# Patient Record
Sex: Male | Born: 1935 | Race: White | Hispanic: No | Smoking: Never smoker
Health system: Southern US, Community
[De-identification: ages and names within clinical notes are randomized; demographics above are authoritative.]

## PROBLEM LIST (undated history)

## (undated) DIAGNOSIS — E7800 Pure hypercholesterolemia, unspecified: Secondary | ICD-10-CM

## (undated) DIAGNOSIS — E119 Type 2 diabetes mellitus without complications: Secondary | ICD-10-CM

## (undated) DIAGNOSIS — I739 Peripheral vascular disease, unspecified: Secondary | ICD-10-CM

## (undated) DIAGNOSIS — I10 Essential (primary) hypertension: Secondary | ICD-10-CM

## (undated) DIAGNOSIS — K649 Unspecified hemorrhoids: Secondary | ICD-10-CM

## (undated) DIAGNOSIS — Z794 Long term (current) use of insulin: Secondary | ICD-10-CM

## (undated) DIAGNOSIS — C4490 Unspecified malignant neoplasm of skin, unspecified: Secondary | ICD-10-CM

## (undated) DIAGNOSIS — Z7902 Long term (current) use of antithrombotics/antiplatelets: Secondary | ICD-10-CM

## (undated) DIAGNOSIS — E785 Hyperlipidemia, unspecified: Secondary | ICD-10-CM

## (undated) DIAGNOSIS — I493 Ventricular premature depolarization: Secondary | ICD-10-CM

## (undated) DIAGNOSIS — M1990 Unspecified osteoarthritis, unspecified site: Secondary | ICD-10-CM

## (undated) DIAGNOSIS — S065XAA Traumatic subdural hemorrhage with loss of consciousness status unknown, initial encounter: Secondary | ICD-10-CM

## (undated) DIAGNOSIS — I5030 Unspecified diastolic (congestive) heart failure: Secondary | ICD-10-CM

## (undated) DIAGNOSIS — I442 Atrioventricular block, complete: Secondary | ICD-10-CM

## (undated) HISTORY — PX: EYE SURGERY: SHX253

## (undated) HISTORY — PX: HERNIA REPAIR: SHX51

## (undated) HISTORY — PX: HEMORRHOID BANDING: SHX5850

## (undated) HISTORY — PX: FEMORAL ENDARTERECTOMY: SUR606

---

## 2007-11-28 ENCOUNTER — Ambulatory Visit: Payer: Self-pay | Admitting: Vascular Surgery

## 2008-01-14 ENCOUNTER — Ambulatory Visit: Payer: Self-pay | Admitting: Neurology

## 2008-01-20 ENCOUNTER — Inpatient Hospital Stay: Payer: Self-pay | Admitting: Vascular Surgery

## 2009-07-15 IMAGING — XA IR VASCULAR PROCEDURE
9 series · 15 of 24 positions shown · non-contrast
Comparison: none

[Series 1: run · 1 of 16 slices shown (1 of 9)]
[im 1/16]
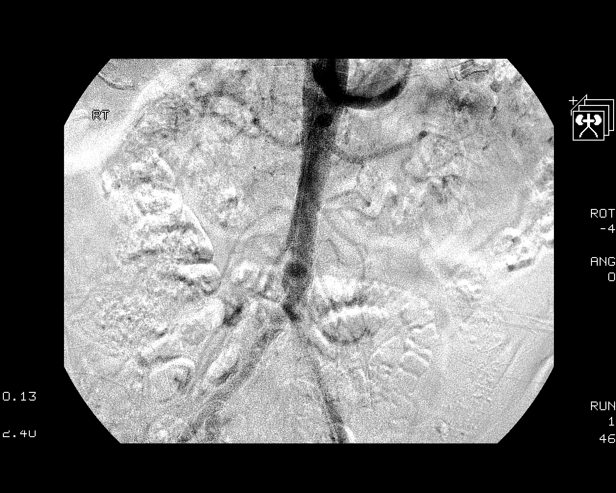

[Series 1: run · 1 of 46 slices shown (2 of 9)]
[im 23/46]
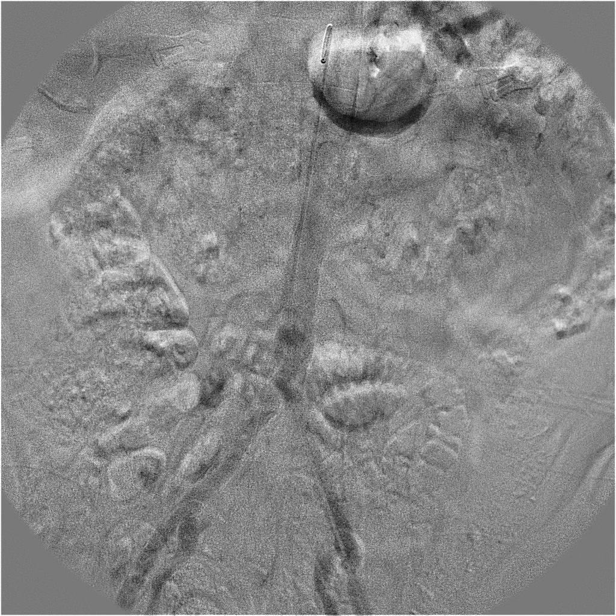

[Series 2: run · 2 of 36 slices shown (3 of 9)]
[im 1/36]
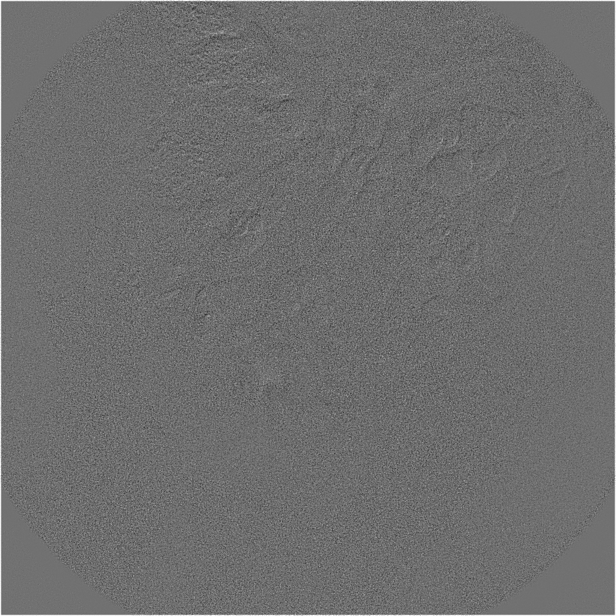
[im 18/36]
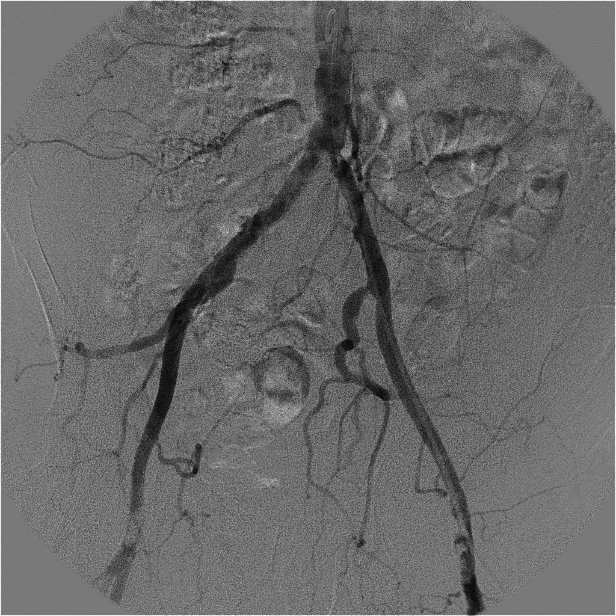

[Series 3: run · 3 of 47 slices shown (4 of 9)]
[im 1/47]
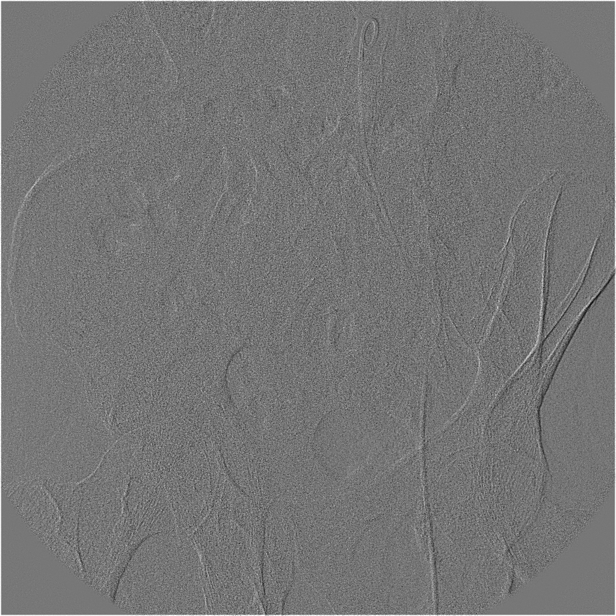
[im 16/47]
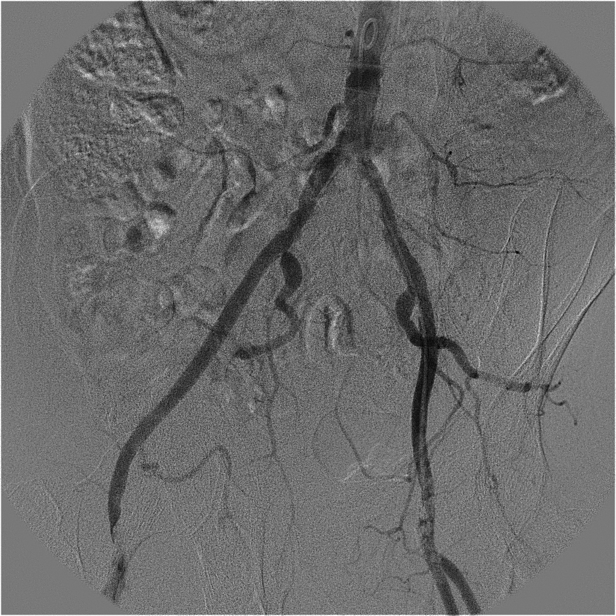
[im 47/47]
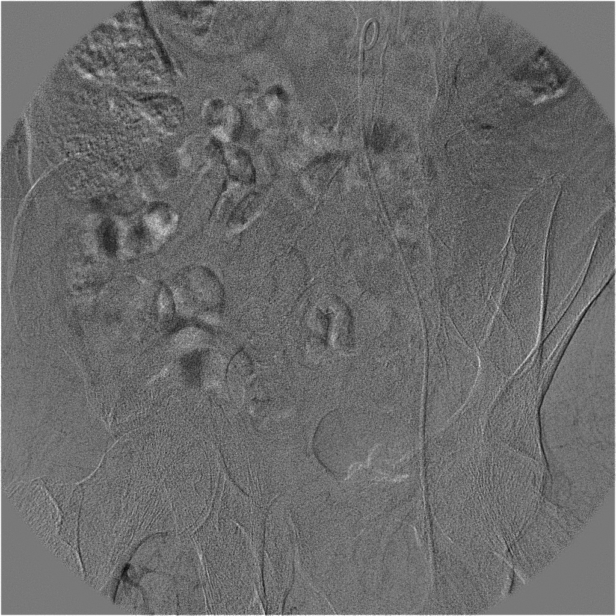

[Series 4: run · 2 of 59 slices shown (5 of 9)]
[im 20/59]
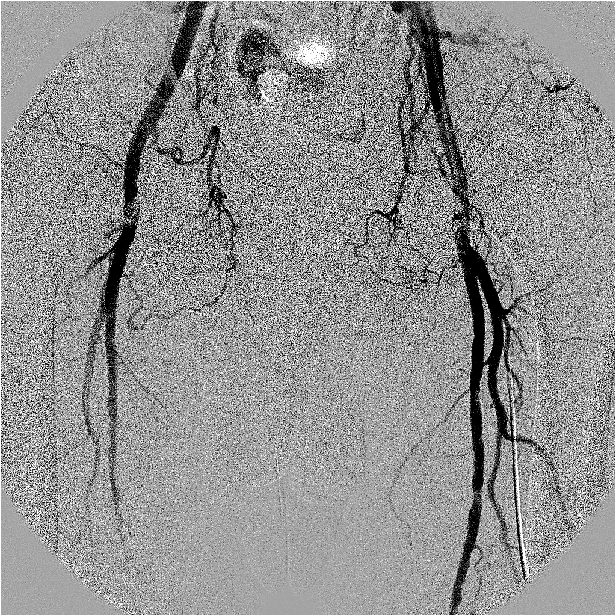
[im 39/59]
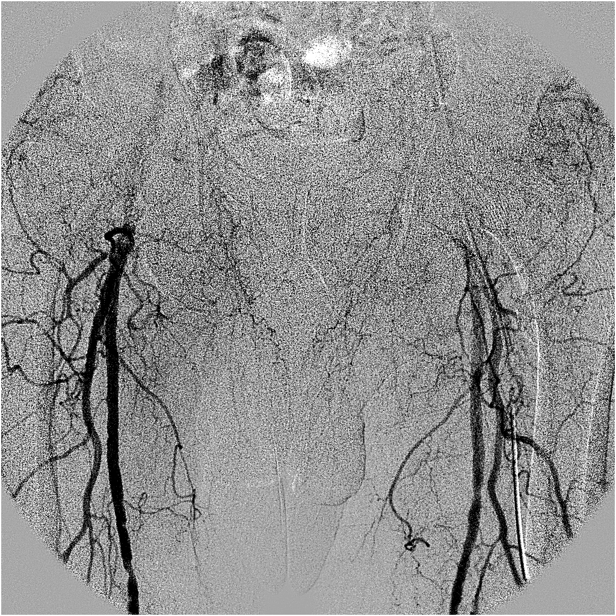

[Series 5: run · 2 of 26 slices shown (6 of 9)]
[im 1/26]
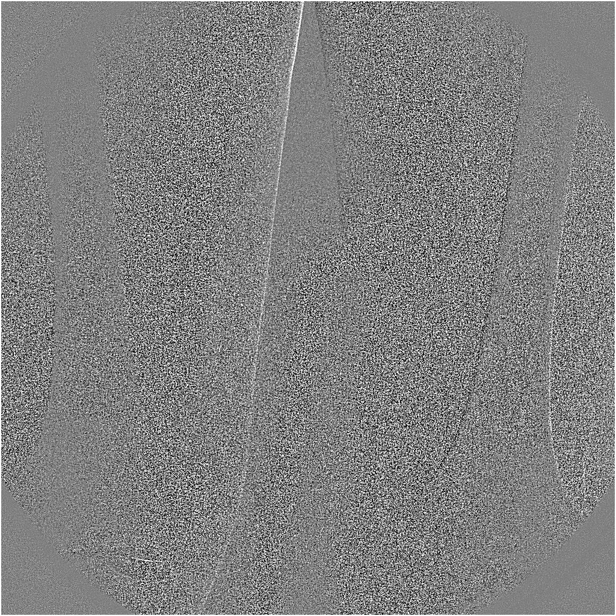
[im 26/26]
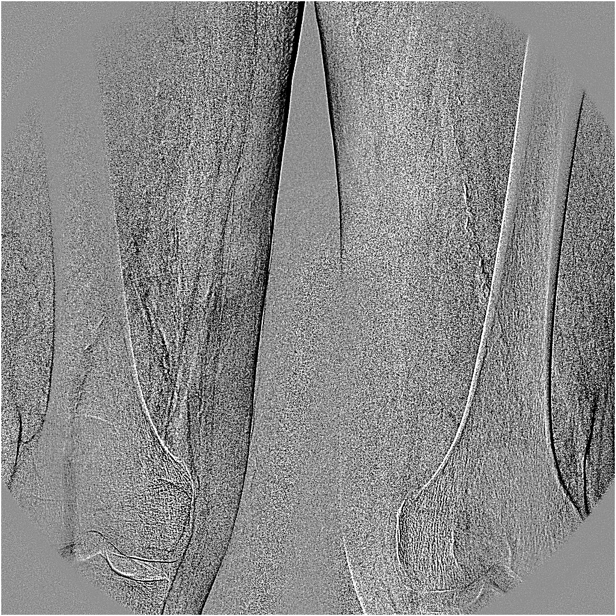

[Series 6: run · 1 of 21 slices shown (7 of 9)]
[im 21/21]
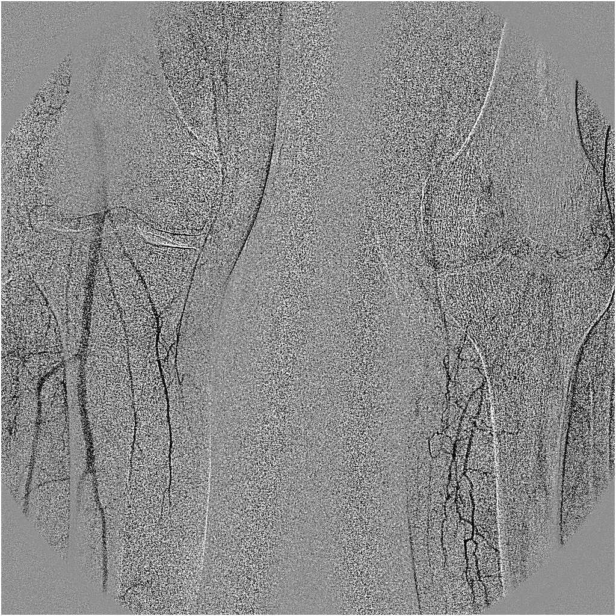

[Series 7: run · 1 of 32 slices shown (8 of 9)]
[im 32/32]
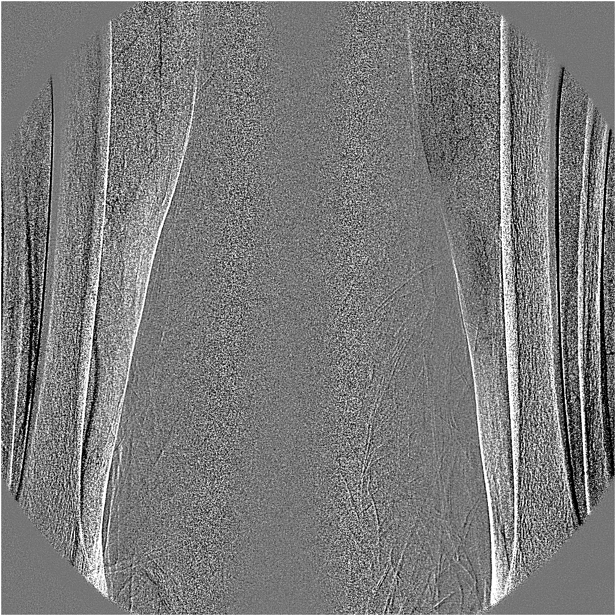

[Series 8: run · 2 of 36 slices shown (9 of 9)]
[im 1/36]
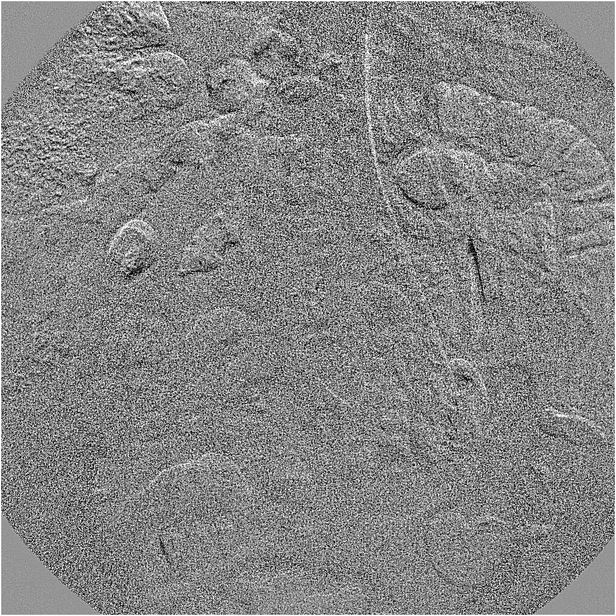
[im 36/36]
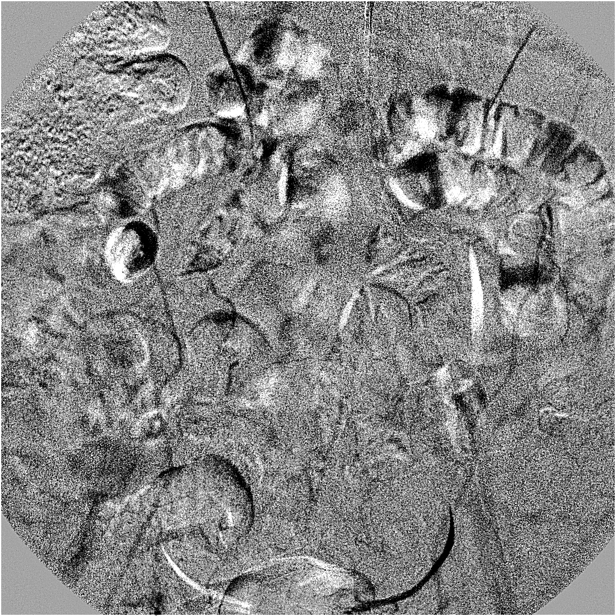

[15 of 24 positions shown; findings below may reference images not displayed]

IMAGES IMPORTED FROM THE [employer] WORKFLOW SYSTEM
NO DICTATION FOR STUDY

## 2009-08-31 IMAGING — CR DG CHEST 2V
1 series · 2 of 2 positions shown · non-contrast
Comparison: none

REASON FOR EXAM: htn,diabetes
COMMENTS:

[Series 1: view not recorded · 0.17mm/px · 2 of 2 slices shown]
[im 1/2]
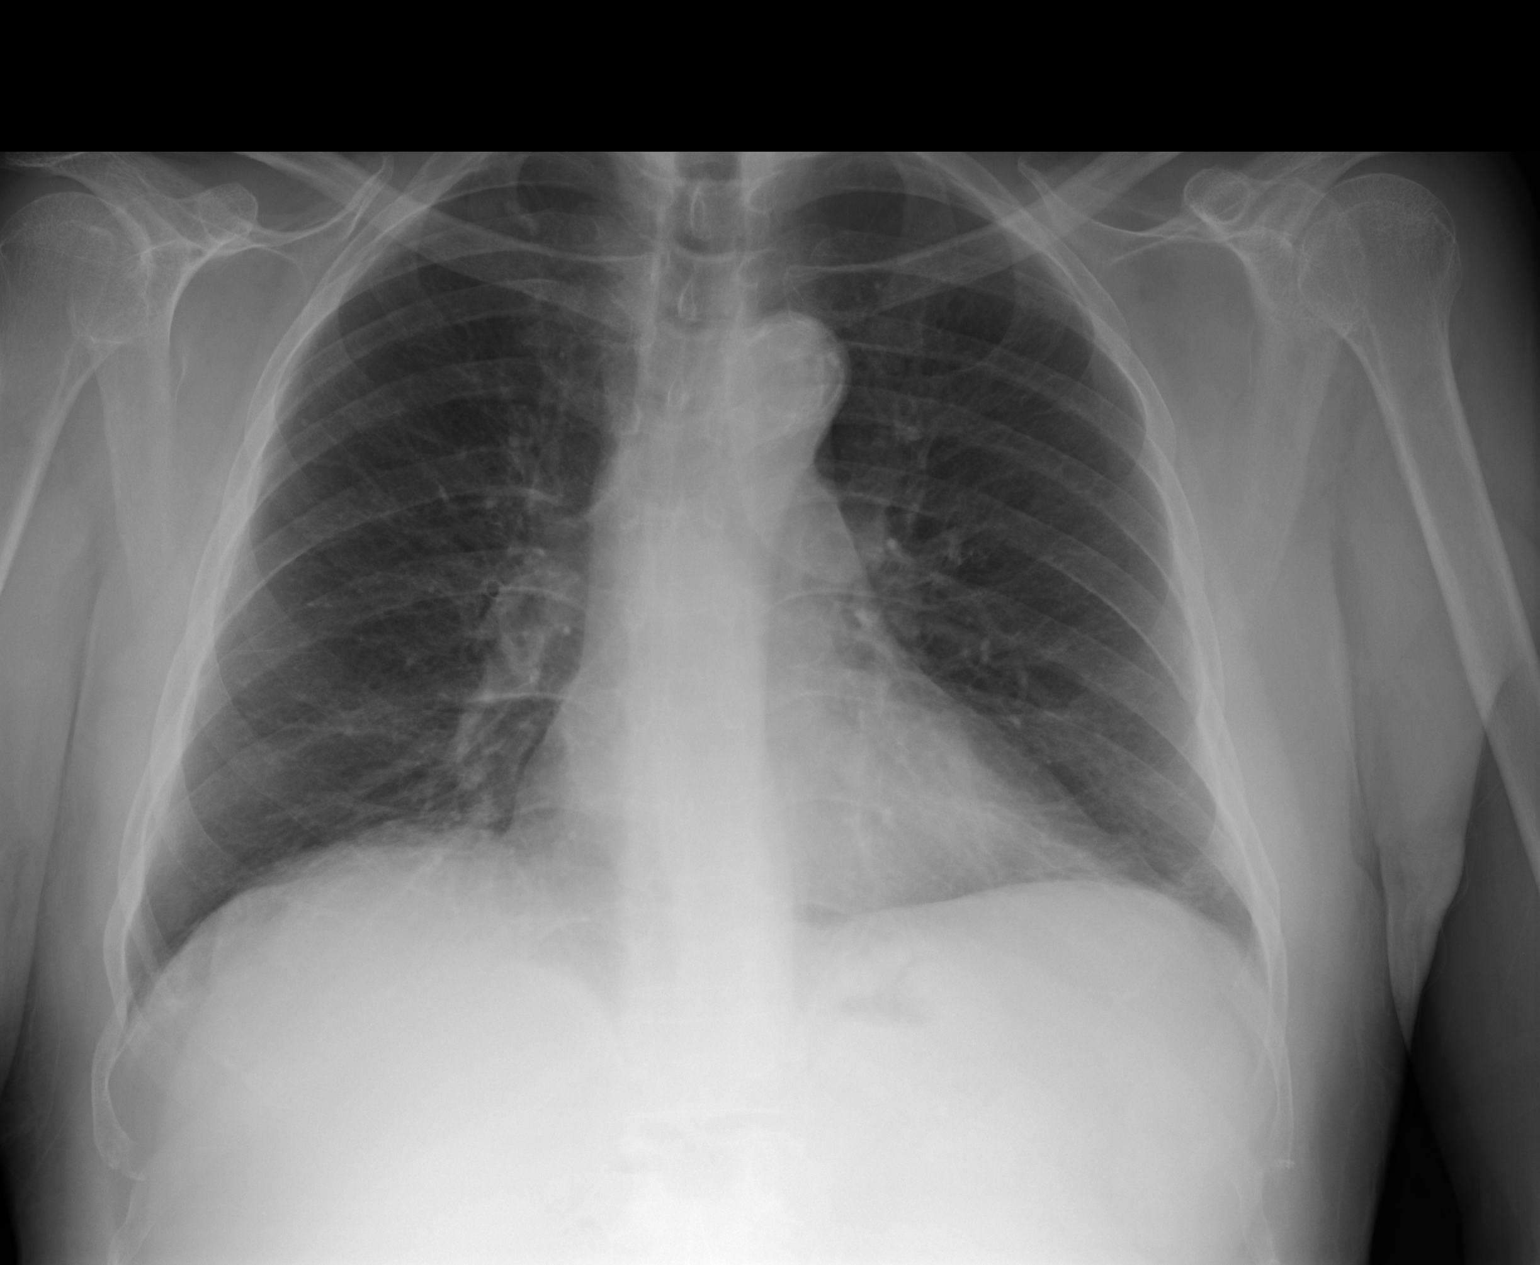
[im 2/2]
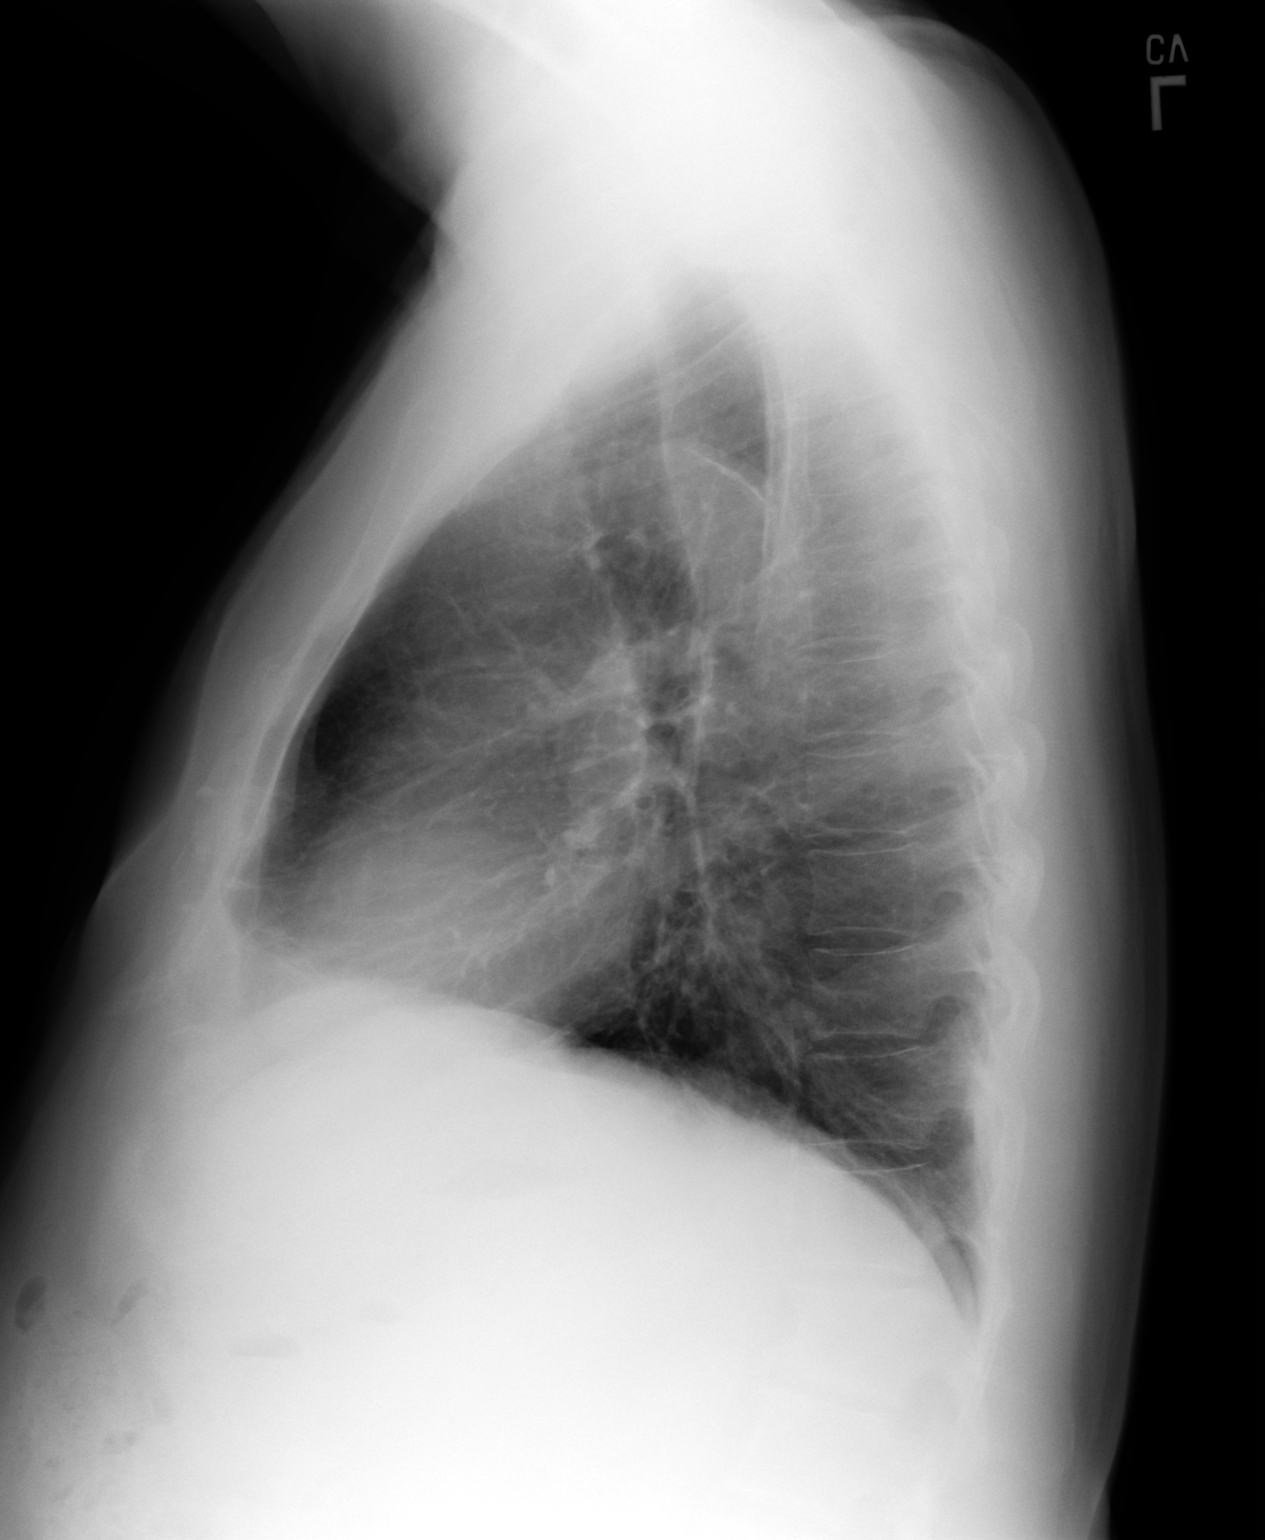

[2 of 2 positions shown; findings below may reference images not displayed]

PROCEDURE:     DXR - DXR CHEST PA (OR AP) AND LATERAL  - January 14, 2008  [DATE]

RESULT:     There is no previous exam for comparison.

Atherosclerotic calcification is present in the aorta. The lungs are clear.
The heart and pulmonary vessels are normal. The bony and mediastinal
structures are unremarkable. There is no effusion. There is no pneumothorax
or evidence of congestive failure.
IMPRESSION: No acute cardiopulmonary disease.

## 2013-01-22 ENCOUNTER — Emergency Department: Payer: Self-pay | Admitting: Emergency Medicine

## 2013-01-22 LAB — URINALYSIS, COMPLETE
Bacteria: NONE SEEN
Bilirubin,UR: NEGATIVE
Blood: NEGATIVE
Glucose,UR: 50 mg/dL
Leukocyte Esterase: NEGATIVE
Nitrite: NEGATIVE
Ph: 6
Protein: NEGATIVE
RBC,UR: <1 /HPF
Specific Gravity: 1.005
Squamous Epithelial: 1
WBC UR: <1 /HPF

## 2013-01-22 LAB — COMPREHENSIVE METABOLIC PANEL WITH GFR
Albumin: 3.5 g/dL
Alkaline Phosphatase: 177 U/L — ABNORMAL HIGH
Anion Gap: 5 — ABNORMAL LOW
BUN: 18 mg/dL
Bilirubin,Total: 0.4 mg/dL
Calcium, Total: 8.9 mg/dL
Chloride: 104 mmol/L
Co2: 28 mmol/L
Creatinine: 1.35 mg/dL — ABNORMAL HIGH
EGFR (African American): 56 — ABNORMAL LOW
EGFR (Non-African Amer.): 48 — ABNORMAL LOW
Glucose: 211 mg/dL — ABNORMAL HIGH
Osmolality: 282
Potassium: 3.9 mmol/L
SGOT(AST): 21 U/L
SGPT (ALT): 20 U/L
Sodium: 137 mmol/L
Total Protein: 6.8 g/dL

## 2013-01-22 LAB — CK TOTAL AND CKMB (NOT AT ARMC)
CK, Total: 42 U/L (ref 35–232)
CK-MB: 1 ng/mL (ref 0.5–3.6)

## 2013-01-22 LAB — CBC
HCT: 45.4 % (ref 40.0–52.0)
HGB: 15.5 g/dL (ref 13.0–18.0)
MCV: 88 fL (ref 80–100)
WBC: 5.9 10*3/uL (ref 3.8–10.6)

## 2013-01-22 LAB — TROPONIN I
Troponin-I: <0.02 ng/mL
Troponin-I: <0.02 ng/mL

## 2016-01-28 ENCOUNTER — Emergency Department
Admission: EM | Admit: 2016-01-28 | Discharge: 2016-01-28 | Payer: Medicare Other | Attending: Emergency Medicine | Admitting: Emergency Medicine

## 2016-01-28 ENCOUNTER — Encounter: Payer: Self-pay | Admitting: Emergency Medicine

## 2016-01-28 ENCOUNTER — Emergency Department: Payer: Medicare Other

## 2016-01-28 ENCOUNTER — Ambulatory Visit (HOSPITAL_COMMUNITY)
Admission: AD | Admit: 2016-01-28 | Discharge: 2016-01-28 | Disposition: A | Discharge status: Home / Self Care | Payer: Medicare Other | Source: Other Acute Inpatient Hospital | Attending: Emergency Medicine | Admitting: Emergency Medicine

## 2016-01-28 DIAGNOSIS — I6200 Nontraumatic subdural hemorrhage, unspecified: Secondary | ICD-10-CM | POA: Insufficient documentation

## 2016-01-28 DIAGNOSIS — X58XXXA Exposure to other specified factors, initial encounter: Secondary | ICD-10-CM | POA: Diagnosis not present

## 2016-01-28 DIAGNOSIS — S065X0A Traumatic subdural hemorrhage without loss of consciousness, initial encounter: Secondary | ICD-10-CM | POA: Insufficient documentation

## 2016-01-28 DIAGNOSIS — Z7984 Long term (current) use of oral hypoglycemic drugs: Secondary | ICD-10-CM | POA: Insufficient documentation

## 2016-01-28 DIAGNOSIS — R0602 Shortness of breath: Secondary | ICD-10-CM | POA: Diagnosis not present

## 2016-01-28 DIAGNOSIS — R112 Nausea with vomiting, unspecified: Secondary | ICD-10-CM | POA: Diagnosis present

## 2016-01-28 DIAGNOSIS — Y939 Activity, unspecified: Secondary | ICD-10-CM | POA: Diagnosis not present

## 2016-01-28 DIAGNOSIS — I442 Atrioventricular block, complete: Secondary | ICD-10-CM | POA: Diagnosis not present

## 2016-01-28 DIAGNOSIS — Y929 Unspecified place or not applicable: Secondary | ICD-10-CM | POA: Diagnosis not present

## 2016-01-28 DIAGNOSIS — E119 Type 2 diabetes mellitus without complications: Secondary | ICD-10-CM | POA: Diagnosis not present

## 2016-01-28 DIAGNOSIS — Z794 Long term (current) use of insulin: Secondary | ICD-10-CM | POA: Insufficient documentation

## 2016-01-28 DIAGNOSIS — S065XAA Traumatic subdural hemorrhage with loss of consciousness status unknown, initial encounter: Secondary | ICD-10-CM

## 2016-01-28 DIAGNOSIS — S065X9A Traumatic subdural hemorrhage with loss of consciousness of unspecified duration, initial encounter: Secondary | ICD-10-CM

## 2016-01-28 DIAGNOSIS — Y999 Unspecified external cause status: Secondary | ICD-10-CM | POA: Insufficient documentation

## 2016-01-28 DIAGNOSIS — I10 Essential (primary) hypertension: Secondary | ICD-10-CM | POA: Diagnosis not present

## 2016-01-28 HISTORY — DX: Essential (primary) hypertension: I10

## 2016-01-28 HISTORY — DX: Type 2 diabetes mellitus without complications: E11.9

## 2016-01-28 HISTORY — DX: Pure hypercholesterolemia, unspecified: E78.00

## 2016-01-28 LAB — CBC WITH DIFFERENTIAL/PLATELET
Basophils Absolute: 0 10*3/uL (ref 0–0.1)
Basophils Relative: 1 %
Eosinophils Absolute: 0 10*3/uL (ref 0–0.7)
Eosinophils Relative: 1 %
HCT: 47.1 % (ref 40.0–52.0)
Hemoglobin: 15.9 g/dL (ref 13.0–18.0)
Lymphocytes Relative: 30 %
Lymphs Abs: 2.7 10*3/uL (ref 1.0–3.6)
MCH: 30.3 pg (ref 26.0–34.0)
MCHC: 33.8 g/dL (ref 32.0–36.0)
MCV: 89.8 fL (ref 80.0–100.0)
Monocytes Absolute: 0.9 10*3/uL (ref 0.2–1.0)
Monocytes Relative: 10 %
Neutro Abs: 5.4 10*3/uL (ref 1.4–6.5)
Neutrophils Relative %: 60 %
Platelets: 213 10*3/uL (ref 150–440)
RBC: 5.24 MIL/uL (ref 4.40–5.90)
RDW: 14.8 % — ABNORMAL HIGH (ref 11.5–14.5)
WBC: 9.1 10*3/uL (ref 3.8–10.6)

## 2016-01-28 LAB — COMPREHENSIVE METABOLIC PANEL
ALT: 30 U/L (ref 17–63)
AST: 22 U/L (ref 15–41)
Albumin: 3.6 g/dL (ref 3.5–5.0)
Alkaline Phosphatase: 160 U/L — ABNORMAL HIGH (ref 38–126)
Anion gap: 9 (ref 5–15)
BUN: 16 mg/dL (ref 6–20)
CO2: 26 mmol/L (ref 22–32)
Calcium: 8.8 mg/dL — ABNORMAL LOW (ref 8.9–10.3)
Chloride: 103 mmol/L (ref 101–111)
Creatinine, Ser: 1.02 mg/dL (ref 0.61–1.24)
GFR calc Af Amer: >60 mL/min (ref >60)
GFR calc non Af Amer: >60 mL/min (ref >60)
Glucose, Bld: 204 mg/dL — ABNORMAL HIGH (ref 65–99)
Potassium: 3.7 mmol/L (ref 3.5–5.1)
Sodium: 138 mmol/L (ref 135–145)
Total Bilirubin: 1.3 mg/dL — ABNORMAL HIGH (ref 0.3–1.2)
Total Protein: 6.5 g/dL (ref 6.5–8.1)

## 2016-01-28 LAB — BRAIN NATRIURETIC PEPTIDE: B Natriuretic Peptide: 970 pg/mL — ABNORMAL HIGH (ref 0.0–100.0)

## 2016-01-28 LAB — TROPONIN I: Troponin I: 0.07 ng/mL (ref <0.03)

## 2016-01-28 LAB — GLUCOSE, CAPILLARY: Glucose-Capillary: 190 mg/dL — ABNORMAL HIGH (ref 65–99)

## 2016-01-28 NOTE — ED Notes (Signed)
Spoke with pt daughter Laural Benes who lives in [place], [place]. Pt daughter informed of pt condition, status and that he will be transferred to [facility]. She can be reached at [phone].

## 2016-01-28 NOTE — ED Notes (Signed)
[facility] arrived to transfer pt to [facility].

## 2016-01-28 NOTE — ED Provider Notes (Signed)
[facility] Emergency Department Provider Note   ____________________________________________   None    (approximate)  I have reviewed the triage vital signs and the nursing notes.   HISTORY  Chief Complaint Code Stroke and Bradycardia   HPI Charles Jennings is a 82 y.o. male patient reports she's had word finding difficulties for about 2 years. He fell and hit his head in June. He had several episodes of nausea vomiting and diarrhea. He went to see his doctor today because he wasn't feeling really well with the nausea and vomiting and his doctor found that he was bradycardic with a wide complex rhythm. She also has developed edema in his legs. Patient's doctor sent him to the emergency room. In the emergency room because he told EMS that he had been having word finding difficulties and didn't say it was for 2 years he had a CT done. CT showed a subdural hematoma as radiology read as acute. Neurology came to see him and reports a normal neurological exam except for possibly a question of some facial droop which may be intermittent.   Past Medical History:  Diagnosis Date  . Diabetes mellitus without complication ([place])   . Elevated cholesterol   . Hypertension     There are no active problems to display for this patient.   No past surgical history on file.  Prior to Admission medications   Medication Sig Start Date End Date Taking? Authorizing Provider  ASPIR-LOW 81 MG EC tablet Take 1 tablet by mouth daily. 11/30/15  Yes Historical Provider, MD  diltiazem (TIAZAC) 180 MG 24 hr capsule Take 1 capsule by mouth daily. 11/30/15  Yes Historical Provider, MD  lisinopril (PRINIVIL,ZESTRIL) 30 MG tablet Take 1 tablet by mouth daily. 11/30/15  Yes Historical Provider, MD  loratadine (CLARITIN) 10 MG tablet Take 1 tablet by mouth daily. 11/30/15  Yes Historical Provider, MD  NOVOLIN 70/30 (70-30) 100 UNIT/ML injection 10 Units at bedtime.  11/30/15  Yes Historical  Provider, MD  pentoxifylline (TRENTAL) 400 MG CR tablet Take 1 tablet by mouth 3 (three) times daily. 11/30/15  Yes Historical Provider, MD  ranitidine (ZANTAC) 150 MG tablet Take 2 tablets by mouth daily. 11/30/15  Yes Historical Provider, MD  simvastatin (ZOCOR) 10 MG tablet Take 1 tablet by mouth daily. 11/30/15  Yes Historical Provider, MD  metFORMIN (GLUCOPHAGE) 500 MG tablet  11/30/15   Historical Provider, MD    Allergies Review of patient's allergies indicates no known allergies.  No family history on file.  Social History Social History  Substance Use Topics  . Smoking status: Not on file  . Smokeless tobacco: Not on file  . Alcohol use Not on file    Review of Systems Constitutional: No fever/chills Eyes: No visual changes. ENT: No sore throat. Cardiovascular: Denies chest pain. Respiratory: Denies shortness of breath. Gastrointestinal: No abdominal pain.  No nausea, no vomiting.  No diarrhea.  No constipation. Genitourinary: Negative for dysuria. Musculoskeletal: Negative for back pain. Skin: Negative for rash. Neurological: Negative for headaches, focal weakness or numbness.  10-point ROS otherwise negative.  ____________________________________________   PHYSICAL EXAM:  VITAL SIGNS: ED Triage Vitals [01/28/16 1255]  Enc Vitals Group     BP (!) 204/89     Pulse Rate (!) 44     Resp 13     Temp      Temp src      SpO2 92 %     Weight  Height      Head Circumference      Peak Flow      Pain Score      Pain Loc      Pain Edu?      Excl. in [place]?     Constitutional: Alert and oriented. Well appearing and in no acute distress. Eyes: Conjunctivae are normal. PERRL. EOMI. Head: Atraumatic. Nose: No congestion/rhinnorhea. Mouth/Throat: Mucous membranes are moist.  Oropharynx non-erythematous. Neck: No stridor.   CardiovascularRate cardiac, regular rhythm. Grossly normal heart sounds.  Good peripheral circulation. Respiratory: Normal respiratory  effort.  No retractions. Lungs Crackles in bases Gastrointestinal: Soft and nontender. No distention. No abdominal bruits. No CVA tenderness. Musculoskeletal: No lower extremity tenderness bilateral 1+ edema.  No joint effusions. Neurologic:  Normal speech and language. No gross focal neurologic deficits are appreciated.  Skin:  Skin is warm, dry and intact. No rash noted. Psychiatric: Mood and affect are normal. Speech and behavior are normal.  ____________________________________________   LABS (all labs ordered are listed, but only abnormal results are displayed)  Labs Reviewed  GLUCOSE, CAPILLARY - Abnormal; Notable for the following:       Result Value   Glucose-Capillary 190 (*)    All other components within normal limits  COMPREHENSIVE METABOLIC PANEL - Abnormal; Notable for the following:    Glucose, Bld 204 (*)    Calcium 8.8 (*)    Alkaline Phosphatase 160 (*)    Total Bilirubin 1.3 (*)    All other components within normal limits  BRAIN NATRIURETIC PEPTIDE - Abnormal; Notable for the following:    B Natriuretic Peptide 970.0 (*)    All other components within normal limits  TROPONIN I - Abnormal; Notable for the following:    Troponin I 0.07 (*)    All other components within normal limits  CBC WITH DIFFERENTIAL/PLATELET - Abnormal; Notable for the following:    RDW 14.8 (*)    All other components within normal limits   ____________________________________________  EKG  EKG done at the doctor's office shows wide complex bradycardia rate of 41 apparent atrioventricular DV disassociation which would make it third degree heart block ____________________________________________  RADIOLOGY  CLINICAL DATA:  Code stroke.  Problems getting words out.  EXAM: CT HEAD WITHOUT CONTRAST  TECHNIQUE: Contiguous axial images were obtained from the base of the skull through the vertex without intravenous contrast.  COMPARISON:  None.  FINDINGS: Brain: Isodense  right subdural collection measuring 18 mm in thickness most consistent with an acute subdural hematoma resulting in 6 mm of right-to-left midline shift. No evidence of acute infarction or ventriculomegaly. Generalized cerebral atrophy. Periventricular white matter low attenuation likely secondary to microangiopathy.  Vascular: Cerebrovascular atherosclerotic calcifications are noted.  Skull: Negative for fracture or focal lesion.  Sinuses/Orbits: Visualized portions of the orbits are unremarkable. Visualized portions of the paranasal sinuses and mastoid air cells are unremarkable.  Other: None.  IMPRESSION: 1. Acute right subdural hematoma measuring 18 mm in thickness resulting and 6 mm of right to left midline shift. No hydrocephalus. Critical Value/emergent results were called by telephone at the time of interpretation on 01/28/2016 at 12:53 pm to Dr. Cinda Quest, who verbally acknowledged these results.   Electronically Signed   By: Kathreen Devoid   On: 01/28/2016 12:57 ____________________________________________   PROCEDURES  Procedure(s) performed:  Procedures  Critical Care performed: Critical care time one hour most of this is spent evaluating patient and lab results and trying to contact various hospitals trying to  get the patient in the talk to [facility] and one other [facility]. He did transfer center and then [facility] and then more time was spent getting the ambulance to take him ____________________________________________   [place] / [place] / ED COURSE  Pertinent labs & imaging results that were available during my care of the patient were reviewed by me and considered in my medical decision making (see chart for details).  [facility] will [facility] patient expressed preference to go to either [facility] or [facility] second  Clinical Course     ____________________________________________   FINAL CLINICAL  IMPRESSION(S) / ED DIAGNOSES  Final diagnoses:  Subdural hematoma ([place])  Third degree heart block ([place])      NEW MEDICATIONS STARTED DURING THIS VISIT:  Discharge Medication List as of 01/28/2016  3:45 PM       Note:  This document was prepared using Dragon voice recognition software and may include unintentional dictation errors.    Nena Polio, MD 01/29/16 2051

## 2016-01-28 NOTE — ED Notes (Signed)
Pt reports falling in June 2017. Pt reports bleeding from an open head injury but his doctor was out of town so he did not go for treatment. Pt does not remember how he fell. Pt states he woke up in a corner of his bedroom. Pt states he only remembers yelling for help and he felt like he was fighting.

## 2016-01-28 NOTE — ED Provider Notes (Signed)
[facility] Provider Note   CSN: [id number] Arrival date & time: 01/28/16  1235   An emergency department physician performed an initial assessment on this suspected stroke patient at 1237.  History   Chief Complaint Chief Complaint  Patient presents with  . Code Stroke  . Bradycardia    HPI Charles Jennings is a 82 y.o. male.  HPI  Past Medical History:  Diagnosis Date  . Diabetes mellitus without complication ([place])   . Elevated cholesterol   . Hypertension     There are no active problems to display for this patient.   No past surgical history on file.     Home Medications    Prior to Admission medications   Medication Sig Start Date End Date Taking? Authorizing Provider  ASPIR-LOW 81 MG EC tablet Take 1 tablet by mouth daily. 11/30/15  Yes Historical Provider, MD  diltiazem (TIAZAC) 180 MG 24 hr capsule Take 1 capsule by mouth daily. 11/30/15  Yes Historical Provider, MD  lisinopril (PRINIVIL,ZESTRIL) 30 MG tablet Take 1 tablet by mouth daily. 11/30/15  Yes Historical Provider, MD  loratadine (CLARITIN) 10 MG tablet Take 1 tablet by mouth daily. 11/30/15  Yes Historical Provider, MD  NOVOLIN 70/30 (70-30) 100 UNIT/ML injection 10 Units at bedtime.  11/30/15  Yes Historical Provider, MD  pentoxifylline (TRENTAL) 400 MG CR tablet Take 1 tablet by mouth 3 (three) times daily. 11/30/15  Yes Historical Provider, MD  ranitidine (ZANTAC) 150 MG tablet Take 2 tablets by mouth daily. 11/30/15  Yes Historical Provider, MD  simvastatin (ZOCOR) 10 MG tablet Take 1 tablet by mouth daily. 11/30/15  Yes Historical Provider, MD  metFORMIN (GLUCOPHAGE) 500 MG tablet  11/30/15   Historical Provider, MD    Family History No family history on file.  Social History Social History  Substance Use Topics  . Smoking status: Not on file  . Smokeless tobacco: Not on file  . Alcohol use Not on file     Allergies   Review of patient's allergies indicates no known  allergies.   Review of Systems Review of Systems   Physical Exam Updated Vital Signs BP (!) 186/54   Pulse (!) 39   Resp 18   SpO2 93%   Physical Exam   ED Treatments / Results  Labs (all labs ordered are listed, but only abnormal results are displayed) Labs Reviewed  GLUCOSE, CAPILLARY - Abnormal; Notable for the following:       Result Value   Glucose-Capillary 190 (*)    All other components within normal limits  COMPREHENSIVE METABOLIC PANEL - Abnormal; Notable for the following:    Glucose, Bld 204 (*)    Calcium 8.8 (*)    Alkaline Phosphatase 160 (*)    Total Bilirubin 1.3 (*)    All other components within normal limits  BRAIN NATRIURETIC PEPTIDE - Abnormal; Notable for the following:    B Natriuretic Peptide 970.0 (*)    All other components within normal limits  TROPONIN I - Abnormal; Notable for the following:    Troponin I 0.07 (*)    All other components within normal limits  CBC WITH DIFFERENTIAL/PLATELET - Abnormal; Notable for the following:    RDW 14.8 (*)    All other components within normal limits    EKG  EKG Interpretation None       Radiology Ct Head Wo Contrast  Result Date: 01/28/2016 CLINICAL DATA:  Code stroke.  Problems getting words out. EXAM: CT HEAD WITHOUT  CONTRAST TECHNIQUE: Contiguous axial images were obtained from the base of the skull through the vertex without intravenous contrast. COMPARISON:  None. FINDINGS: Brain: Isodense right subdural collection measuring 18 mm in thickness most consistent with an acute subdural hematoma resulting in 6 mm of right-to-left midline shift. No evidence of acute infarction or ventriculomegaly. Generalized cerebral atrophy. Periventricular white matter low attenuation likely secondary to microangiopathy. Vascular: Cerebrovascular atherosclerotic calcifications are noted. Skull: Negative for fracture or focal lesion. Sinuses/Orbits: Visualized portions of the orbits are unremarkable. Visualized  portions of the paranasal sinuses and mastoid air cells are unremarkable. Other: None. IMPRESSION: 1. Acute right subdural hematoma measuring 18 mm in thickness resulting and 6 mm of right to left midline shift. No hydrocephalus. Critical Value/emergent results were called by telephone at the time of interpretation on 01/28/2016 at 12:53 pm to Dr. Cinda Quest, who verbally acknowledged these results. Electronically Signed   By: Kathreen Devoid   On: 01/28/2016 12:57   Dg Chest Portable 1 View  Result Date: 01/28/2016 CLINICAL DATA:  Pt reports he has a hard time getting his words out. He has had SOB and nausea, vomiting and diarrhea for 2 weeks. He also fell in June with a concussion but never went to the dr about it. Hx of Diabetes and hypertension. Non smoker EXAM: PORTABLE CHEST 1 VIEW COMPARISON:  01/22/2013 FINDINGS: Cardiac silhouette is mildly enlarged. No mediastinal or hilar masses or evidence of adenopathy. There is bilateral irregular interstitial thickening with central vascular congestion. Small effusions are suspected. No pneumothorax. No focal lung consolidation. Bony thorax is demineralized but intact. IMPRESSION: Findings consistent with congestive heart failure with interstitial pulmonary edema. Electronically Signed   By: Lajean Manes M.D.   On: 01/28/2016 13:59    Procedures Procedures (including critical care time)  Critical care time one hour this includes speaking to [place] and in attempts to transfer him I also spoke to Dr. Wende Mott are neurosurgery at [facility] and Dr. Alita Chyle in the emergency room. He will be accepted to the emergency room in consult neurosurgery and cardiology. Patient has pacer pads on but not connected as again he is symptom-free laying in the bed  Medications Ordered in ED Medications - No data to display   Initial Impression / Assessment and Plan / ED Course  I have reviewed the triage vital signs and the nursing notes.  Pertinent labs & imaging results that  were available during my care of the patient were reviewed by me and considered in my medical decision making (see chart for details).  Clinical Course  I will not give the patient any aspirin with the subdural which is being read as acute. Again the patient is pain-free no signs of acute MI at present. The elevated troponin may be coming down from whatever episode gave him a heart block.  Patient remains pain free comfortable without symptoms lying in the bed.  Final Clinical Impressions(s) / ED Diagnoses   Final diagnoses:  Subdural hematoma ([place])  Third degree heart block [facility])    New Prescriptions New Prescriptions   No medications on file     Nena Polio, MD 01/28/16 1448

## 2016-01-28 NOTE — ED Notes (Signed)
Dr. Cinda Quest notified of troponin 0.07.

## 2016-01-28 NOTE — Consult Note (Addendum)
Referring Physician: Cinda Quest    Chief Complaint: Difficulty with speech  HPI: Charles Jennings is an 82 y.o. male who went to his PCP with multiple complaints today including nausea, vomiting, diarrhea, leg swelling.  Was noted to be bradycardic and hypoxic as well.  EMS was called to bring him to the ED for further evaluation and patient reported that he was having difficult finding the words he wanted to say.  On further questioning patient reported that he had been having that trouble for about two years.  On arrival code stroke was called.  Initial NIHSS of 1.    Date last known well: Unable to determine Time last known well: Unable to determine tPA Given: No: Unknown LKW, SDH on imaging  Past Medical History:  Diagnosis Date  . Diabetes mellitus without complication ([place])   . Elevated cholesterol   . Hypertension     No past surgical history on file.  No family history on file.   Social History:  Patient reports no current history of tobacco, alcohol or illicit drug history.  Allergies: No Known Allergies  Medications: I have reviewed the patient's current medications. Prior to Admission:  Prior to Admission medications   Medication Sig Start Date End Date Taking? Authorizing Provider  ASPIR-LOW 81 MG EC tablet Take 1 tablet by mouth daily. 11/30/15  Yes Historical Provider, MD  diltiazem (TIAZAC) 180 MG 24 hr capsule Take 1 capsule by mouth daily. 11/30/15  Yes Historical Provider, MD  lisinopril (PRINIVIL,ZESTRIL) 30 MG tablet Take 1 tablet by mouth daily. 11/30/15  Yes Historical Provider, MD  loratadine (CLARITIN) 10 MG tablet Take 1 tablet by mouth daily. 11/30/15  Yes Historical Provider, MD  NOVOLIN 70/30 (70-30) 100 UNIT/ML injection 10 Units at bedtime.  11/30/15  Yes Historical Provider, MD  pentoxifylline (TRENTAL) 400 MG CR tablet Take 1 tablet by mouth 3 (three) times daily. 11/30/15  Yes Historical Provider, MD  ranitidine (ZANTAC) 150 MG tablet Take 2 tablets by  mouth daily. 11/30/15  Yes Historical Provider, MD  simvastatin (ZOCOR) 10 MG tablet Take 1 tablet by mouth daily. 11/30/15  Yes Historical Provider, MD  metFORMIN (GLUCOPHAGE) 500 MG tablet  11/30/15   Historical Provider, MD     ROS: History obtained from the patient  General ROS: negative for - chills, fatigue, fever, night sweats, weight gain or weight loss Psychological ROS: negative for - behavioral disorder, hallucinations, memory difficulties, mood swings or suicidal ideation Ophthalmic ROS: negative for - blurry vision, double vision, eye pain or loss of vision ENT ROS: negative for - epistaxis, nasal discharge, oral lesions, sore throat, tinnitus or vertigo Allergy and Immunology ROS: negative for - hives or itchy/watery eyes Hematological and Lymphatic ROS: negative for - bleeding problems, bruising or swollen lymph nodes Endocrine ROS: negative for - galactorrhea, hair pattern changes, polydipsia/polyuria or temperature intolerance Respiratory ROS: negative for - cough, hemoptysis, shortness of breath or wheezing Cardiovascular ROS: as noted in HPI Gastrointestinal ROS: as noted in HPI Genito-Urinary ROS: negative for - dysuria, hematuria, incontinence or urinary frequency/urgency Musculoskeletal ROS: negative for - joint swelling or muscular weakness Neurological ROS: as noted in HPI Dermatological ROS: negative for rash and skin lesion changes  Physical Examination: Blood pressure (!) 160/62, pulse (!) 41, resp. rate 16, SpO2 96 %.  HEENT-  Normocephalic, no lesions, without obvious abnormality.  Normal external eye and conjunctiva.  Normal TM's bilaterally.  Normal auditory canals and external ears. Normal external nose, mucus membranes and septum.  Normal pharynx. Cardiovascular- S1, S2 normal, pulses palpable throughout   Lungs- chest clear, no wheezing, rales, normal symmetric air entry Abdomen- soft, non-tender; bowel sounds normal; no masses,  no  organomegaly Extremities- BLE edema Lymph-no adenopathy palpable Musculoskeletal-no joint tenderness, deformity or swelling Skin-warm and dry, no hyperpigmentation, vitiligo, or suspicious lesions  Neurological Examination Mental Status: Alert, oriented, thought content appropriate.  Speech fluent without evidence of aphasia.  Able to follow 3 step commands without difficulty. Cranial Nerves: II: Discs flat bilaterally; Visual fields grossly normal, pupils equal, round, reactive to light and accommodation III,IV, VI: ptosis not present, extra-ocular motions intact bilaterally V,VII: mild decrease in left NLF, facial light touch sensation normal bilaterally VIII: hearing normal bilaterally IX,X: gag reflex present XI: bilateral shoulder shrug XII: midline tongue extension Motor: Right : Upper extremity   5/5    Left:     Upper extremity   5/5  Lower extremity   5-/5     Lower extremity   5-/5 Tone and bulk:normal tone throughout; no atrophy noted.  LE difficulties due to heaviness and pain from swelling Sensory: Pinprick and light touch intact throughout, bilaterally Deep Tendon Reflexes: 2+ and symmetric with absent AJ's bilaterally Plantars: Right: downgoing   Left: equivocal Cerebellar: Normal finger-to-nose and normal heel-to-shin testing bilaterally Gait: not tested due to safety concerns    Laboratory Studies:  Basic Metabolic Panel: No results for input(s): NA, K, CL, CO2, GLUCOSE, BUN, CREATININE, CALCIUM, MG, PHOS in the last 168 hours.  Liver Function Tests: No results for input(s): AST, ALT, ALKPHOS, BILITOT, PROT, ALBUMIN in the last 168 hours. No results for input(s): LIPASE, AMYLASE in the last 168 hours. No results for input(s): AMMONIA in the last 168 hours.  CBC:  Recent Labs Lab 01/28/16 1259  WBC 9.1  NEUTROABS 5.4  HGB 15.9  HCT 47.1  MCV 89.8  PLT 213    Cardiac Enzymes: No results for input(s): CKTOTAL, CKMB, CKMBINDEX, TROPONINI in the last  168 hours.  BNP: Invalid input(s): POCBNP  CBG:  Recent Labs Lab 01/28/16 [place]    Microbiology: No results found for this or any previous visit.  Coagulation Studies: No results for input(s): LABPROT, INR in the last 72 hours.  Urinalysis: No results for input(s): COLORURINE, LABSPEC, PHURINE, GLUCOSEU, HGBUR, BILIRUBINUR, KETONESUR, PROTEINUR, UROBILINOGEN, NITRITE, LEUKOCYTESUR in the last 168 hours.  Invalid input(s): APPERANCEUR  Lipid Panel: No results found for: CHOL, TRIG, HDL, CHOLHDL, VLDL, LDLCALC  HgbA1C: No results found for: HGBA1C  Urine Drug Screen:  No results found for: LABOPIA, COCAINSCRNUR, LABBENZ, AMPHETMU, THCU, LABBARB  Alcohol Level: No results for input(s): ETH in the last 168 hours.  Other results: EKG: sinus bradycardia at 41 bpm.  Imaging: Ct Head Wo Contrast  Result Date: 01/28/2016 CLINICAL DATA:  Code stroke.  Problems getting words out. EXAM: CT HEAD WITHOUT CONTRAST TECHNIQUE: Contiguous axial images were obtained from the base of the skull through the vertex without intravenous contrast. COMPARISON:  None. FINDINGS: Brain: Isodense right subdural collection measuring 18 mm in thickness most consistent with an acute subdural hematoma resulting in 6 mm of right-to-left midline shift. No evidence of acute infarction or ventriculomegaly. Generalized cerebral atrophy. Periventricular white matter low attenuation likely secondary to microangiopathy. Vascular: Cerebrovascular atherosclerotic calcifications are noted. Skull: Negative for fracture or focal lesion. Sinuses/Orbits: Visualized portions of the orbits are unremarkable. Visualized portions of the paranasal sinuses and mastoid air cells are unremarkable. Other: None. IMPRESSION: 1. Acute right subdural hematoma  measuring 18 mm in thickness resulting and 6 mm of right to left midline shift. No hydrocephalus. Critical Value/emergent results were called by telephone at the time of  interpretation on 01/28/2016 at 12:53 pm to Dr. Cinda Quest, who verbally acknowledged these results. Electronically Signed   By: Kathreen Devoid   On: 01/28/2016 12:57    Assessment: 82 y.o. male presenting with multiple complaints.  Also reported some difficulty with speech.  Head CT performed and personally reviewed.  Right SDH noted with 23mm midline shift.  Patient does report a significant fall in June of this year.  Patient not a tPA candidate and not felt to have had a stroke or TIA therefore no stroke work up indicated at this time.    Stroke Risk Factors - diabetes mellitus, hyperlipidemia and hypertension  Plan: 1. D/C [facility] and [place]  2. Transfer to facility with access to Neurosurgery 3. Telemetry monitoring 4. Frequent neuro checks   Alexis Goodell, MD Neurology [phone] 01/28/2016, 1:52 PM

## 2016-01-28 NOTE — ED Triage Notes (Signed)
Pt arrived via EMS from his PCP office. Pt went to see MD for reports of difficulty getting his words out which he states has been for approximately two years. Pt reported SOB, N/V/D for 2 weeks. Pt reports swelling to lower extremities bilaterally. Pt reports rectal bleeding. EMS reports 214/80 and bradycardia.

## 2016-01-31 DIAGNOSIS — Z950 Presence of cardiac pacemaker: Secondary | ICD-10-CM

## 2016-01-31 HISTORY — PX: PACEMAKER INSERTION: SHX728

## 2016-01-31 HISTORY — DX: Presence of cardiac pacemaker: Z95.0

## 2016-02-11 DIAGNOSIS — E782 Mixed hyperlipidemia: Secondary | ICD-10-CM | POA: Diagnosis present

## 2016-02-11 DIAGNOSIS — I10 Essential (primary) hypertension: Secondary | ICD-10-CM | POA: Diagnosis present

## 2016-02-11 DIAGNOSIS — I442 Atrioventricular block, complete: Secondary | ICD-10-CM | POA: Insufficient documentation

## 2016-02-17 DIAGNOSIS — I493 Ventricular premature depolarization: Secondary | ICD-10-CM | POA: Insufficient documentation

## 2018-03-07 DIAGNOSIS — I5032 Chronic diastolic (congestive) heart failure: Secondary | ICD-10-CM | POA: Insufficient documentation

## 2021-01-03 HISTORY — PX: HEMORRHOID BANDING: SHX5850

## 2021-04-19 ENCOUNTER — Ambulatory Visit: Payer: Self-pay | Admitting: Podiatry

## 2022-05-16 ENCOUNTER — Ambulatory Visit: Payer: Medicare Other

## 2022-05-16 ENCOUNTER — Ambulatory Visit (LOCAL_COMMUNITY_HEALTH_CENTER): Payer: Medicare Other

## 2022-05-16 DIAGNOSIS — Z23 Encounter for immunization: Secondary | ICD-10-CM

## 2022-05-16 DIAGNOSIS — Z719 Counseling, unspecified: Secondary | ICD-10-CM

## 2022-05-16 NOTE — Progress Notes (Signed)
  Are you feeling sick today? No   Have you ever received a dose of COVID-19 Vaccine? [employer], [place], [place], [place], Other) Yes  If yes, which vaccine and how many doses?   5 doses Pfizer   Did you bring the vaccination record card or other documentation?  Yes   Do you have a health condition or are undergoing treatment that makes you moderately or severely immunocompromised? This would include, but not be limited to: cancer, HIV, organ transplant, immunosuppressive therapy/high-dose corticosteroids, or moderate/severe primary immunodeficiency.  No  Have you received COVID-19 vaccine before or during hematopoietic cell transplant (HCT) or CAR-T-cell therapies? No  Have you ever had an allergic reaction to: (This would include a severe allergic reaction or a reaction that caused hives, swelling, or respiratory distress, including wheezing.) A component of a COVID-19 vaccine or a previous dose of COVID-19 vaccine? No   Have you ever had an allergic reaction to another vaccine (other thanCOVID-19 vaccine) or an injectable medication? (This would include a severe allergic reaction or a reaction that caused hives, swelling, or respiratory distress, including wheezing.)   No    Do you have a history of any of the following:  Myocarditis or Pericarditis No  Dermal fillers:  No  Multisystem Inflammatory Syndrome (MIS-C or MIS-A)? No  COVID-19 disease within the past 3 months? No  Vaccinated with monkeypox vaccine in the last 4 weeks? No  VIS provided for each vaccine. Flu and Comirnaty given in left deltoid. RSV given IM in right deltoid. Tolerated well. COVID card updated. [facility] updated. Waited 15 minutes.

## 2022-05-17 NOTE — Progress Notes (Signed)
Patient had a fall in the [facility] parking lot on the way into his nurse clinic appointment. Patient ambulated by himself into the clinic without problem.  Patient was briefly examined by FNP [facility].   Patient had multiple abrasions on bilateral upper extremities. See paper documentation for exact locations. Cuts were cleaned with water and gauze and wrapped, Band-Aid on smaller cuts. Head examined- no abrasions or bruising noted. Denied hitting his head or loss of consciousness. Patient talking without difficulty and oriented. Denied dizziness, states he tripped on the curb and that is why he fell. Examined bilateral knees- no abrasions or signs of injury.  Patient is on oral aspirin, denies other oral anticoagulant use. Considering he did not hit his head and has no evidence of head trauma- I have no concern for serious injury.  Discussed with patient that I felt like he should have follow up with his primary care doctor, considering his age and to discuss his fall risk. Patient was resistant to this. Discussed that I felt it would be good for them to know of the fall, in case problems arise in the next few weeks. Patient mentioned his only daughter lives in [place] and he worries that If we call the PCP, they will tell the daughter he fell and he will be "shipped off to [place]". Reassure that I am not making the recommendation to move, as I don't know him or his medical hx, but I would feel better knowing that his PCP is aware of this fall.  Patient agreed to sign the release and Dr. Hall Busing (PCP) in [place] was called. RN who answered phone stated Charles Jennings is well known to the clinic. They stated he is resistant at times to coming in for care, and often talks about worrying about being moved to [place]. They mention he likes to walk everywhere and he has frequent falls.   Sharlet [place], [place]

## 2022-06-11 ENCOUNTER — Other Ambulatory Visit: Payer: Self-pay

## 2022-06-11 ENCOUNTER — Emergency Department: Payer: Medicare Other

## 2022-06-11 ENCOUNTER — Inpatient Hospital Stay
Admission: EM | Admit: 2022-06-11 | Discharge: 2022-06-15 | DRG: 253 | Disposition: A | Discharge status: Home / Self Care | Payer: Medicare Other | Attending: Student in an Organized Health Care Education/Training Program | Admitting: Student in an Organized Health Care Education/Training Program

## 2022-06-11 ENCOUNTER — Encounter: Payer: Self-pay | Admitting: Internal Medicine

## 2022-06-11 ENCOUNTER — Inpatient Hospital Stay: Payer: Medicare Other

## 2022-06-11 DIAGNOSIS — Z9889 Other specified postprocedural states: Secondary | ICD-10-CM | POA: Diagnosis not present

## 2022-06-11 DIAGNOSIS — I998 Other disorder of circulatory system: Secondary | ICD-10-CM | POA: Diagnosis not present

## 2022-06-11 DIAGNOSIS — E1152 Type 2 diabetes mellitus with diabetic peripheral angiopathy with gangrene: Secondary | ICD-10-CM | POA: Diagnosis present

## 2022-06-11 DIAGNOSIS — R112 Nausea with vomiting, unspecified: Secondary | ICD-10-CM

## 2022-06-11 DIAGNOSIS — E1122 Type 2 diabetes mellitus with diabetic chronic kidney disease: Secondary | ICD-10-CM | POA: Diagnosis not present

## 2022-06-11 DIAGNOSIS — Y838 Other surgical procedures as the cause of abnormal reaction of the patient, or of later complication, without mention of misadventure at the time of the procedure: Secondary | ICD-10-CM | POA: Diagnosis not present

## 2022-06-11 DIAGNOSIS — I10 Essential (primary) hypertension: Secondary | ICD-10-CM | POA: Diagnosis not present

## 2022-06-11 DIAGNOSIS — L97529 Non-pressure chronic ulcer of other part of left foot with unspecified severity: Secondary | ICD-10-CM | POA: Diagnosis present

## 2022-06-11 DIAGNOSIS — Z87891 Personal history of nicotine dependence: Secondary | ICD-10-CM

## 2022-06-11 DIAGNOSIS — Z66 Do not resuscitate: Secondary | ICD-10-CM | POA: Diagnosis present

## 2022-06-11 DIAGNOSIS — N182 Chronic kidney disease, stage 2 (mild): Secondary | ICD-10-CM | POA: Diagnosis not present

## 2022-06-11 DIAGNOSIS — I97618 Postprocedural hemorrhage and hematoma of a circulatory system organ or structure following other circulatory system procedure: Secondary | ICD-10-CM | POA: Diagnosis not present

## 2022-06-11 DIAGNOSIS — E11621 Type 2 diabetes mellitus with foot ulcer: Secondary | ICD-10-CM | POA: Diagnosis present

## 2022-06-11 DIAGNOSIS — N189 Chronic kidney disease, unspecified: Secondary | ICD-10-CM

## 2022-06-11 DIAGNOSIS — N179 Acute kidney failure, unspecified: Secondary | ICD-10-CM | POA: Diagnosis present

## 2022-06-11 DIAGNOSIS — E1142 Type 2 diabetes mellitus with diabetic polyneuropathy: Secondary | ICD-10-CM | POA: Diagnosis present

## 2022-06-11 DIAGNOSIS — Z794 Long term (current) use of insulin: Secondary | ICD-10-CM | POA: Diagnosis not present

## 2022-06-11 DIAGNOSIS — I110 Hypertensive heart disease with heart failure: Secondary | ICD-10-CM | POA: Diagnosis present

## 2022-06-11 DIAGNOSIS — I70222 Atherosclerosis of native arteries of extremities with rest pain, left leg: Secondary | ICD-10-CM | POA: Diagnosis present

## 2022-06-11 DIAGNOSIS — I70262 Atherosclerosis of native arteries of extremities with gangrene, left leg: Secondary | ICD-10-CM | POA: Diagnosis present

## 2022-06-11 DIAGNOSIS — Z950 Presence of cardiac pacemaker: Secondary | ICD-10-CM | POA: Diagnosis not present

## 2022-06-11 DIAGNOSIS — I70245 Atherosclerosis of native arteries of left leg with ulceration of other part of foot: Secondary | ICD-10-CM | POA: Diagnosis not present

## 2022-06-11 DIAGNOSIS — Z79899 Other long term (current) drug therapy: Secondary | ICD-10-CM

## 2022-06-11 DIAGNOSIS — E782 Mixed hyperlipidemia: Secondary | ICD-10-CM | POA: Diagnosis present

## 2022-06-11 DIAGNOSIS — I5032 Chronic diastolic (congestive) heart failure: Secondary | ICD-10-CM | POA: Diagnosis present

## 2022-06-11 DIAGNOSIS — E119 Type 2 diabetes mellitus without complications: Secondary | ICD-10-CM

## 2022-06-11 DIAGNOSIS — I70201 Unspecified atherosclerosis of native arteries of extremities, right leg: Secondary | ICD-10-CM | POA: Diagnosis not present

## 2022-06-11 DIAGNOSIS — Z7982 Long term (current) use of aspirin: Secondary | ICD-10-CM

## 2022-06-11 LAB — CBC WITH DIFFERENTIAL/PLATELET
Abs Immature Granulocytes: 0.03 10*3/uL (ref 0.00–0.07)
Basophils Absolute: 0 10*3/uL (ref 0.0–0.1)
Basophils Relative: 0 %
Eosinophils Absolute: 0 10*3/uL (ref 0.0–0.5)
Eosinophils Relative: 0 %
HCT: 54 % — ABNORMAL HIGH (ref 39.0–52.0)
Hemoglobin: 17.8 g/dL — ABNORMAL HIGH (ref 13.0–17.0)
Immature Granulocytes: 0 %
Lymphocytes Relative: 20 %
Lymphs Abs: 1.9 10*3/uL (ref 0.7–4.0)
MCH: 29.7 pg (ref 26.0–34.0)
MCHC: 33 g/dL (ref 30.0–36.0)
MCV: 90 fL (ref 80.0–100.0)
Monocytes Absolute: 1 10*3/uL (ref 0.1–1.0)
Monocytes Relative: 10 %
Neutro Abs: 6.6 10*3/uL (ref 1.7–7.7)
Neutrophils Relative %: 70 %
Platelets: 187 10*3/uL (ref 150–400)
RBC: 6 MIL/uL — ABNORMAL HIGH (ref 4.22–5.81)
RDW: 13.1 % (ref 11.5–15.5)
WBC: 9.5 10*3/uL (ref 4.0–10.5)
nRBC: 0 % (ref 0.0–0.2)

## 2022-06-11 LAB — LIPID PANEL
Cholesterol: 105 mg/dL (ref 0–200)
HDL: 47 mg/dL (ref >40)
LDL Cholesterol: 44 mg/dL (ref 0–99)
Total CHOL/HDL Ratio: 2.2 RATIO
Triglycerides: 71 mg/dL (ref <150)
VLDL: 14 mg/dL (ref 0–40)

## 2022-06-11 LAB — SEDIMENTATION RATE: Sed Rate: 4 mm/hr (ref 0–20)

## 2022-06-11 LAB — COMPREHENSIVE METABOLIC PANEL
ALT: 12 U/L (ref 0–44)
AST: 15 U/L (ref 15–41)
Albumin: 4.1 g/dL (ref 3.5–5.0)
Alkaline Phosphatase: 188 U/L — ABNORMAL HIGH (ref 38–126)
Anion gap: 14 (ref 5–15)
BUN: 27 mg/dL — ABNORMAL HIGH (ref 8–23)
CO2: 22 mmol/L (ref 22–32)
Calcium: 9.4 mg/dL (ref 8.9–10.3)
Chloride: 95 mmol/L — ABNORMAL LOW (ref 98–111)
Creatinine, Ser: 1.28 mg/dL — ABNORMAL HIGH (ref 0.61–1.24)
GFR, Estimated: 53 mL/min — ABNORMAL LOW (ref >60)
Glucose, Bld: 275 mg/dL — ABNORMAL HIGH (ref 70–99)
Potassium: 4.4 mmol/L (ref 3.5–5.1)
Sodium: 131 mmol/L — ABNORMAL LOW (ref 135–145)
Total Bilirubin: 1.1 mg/dL (ref 0.3–1.2)
Total Protein: 7.5 g/dL (ref 6.5–8.1)

## 2022-06-11 LAB — PROTIME-INR
INR: 1 (ref 0.8–1.2)
Prothrombin Time: 12.9 seconds (ref 11.4–15.2)

## 2022-06-11 LAB — APTT: aPTT: 29 seconds (ref 24–36)

## 2022-06-11 LAB — LACTIC ACID, PLASMA: Lactic Acid, Venous: 1.9 mmol/L (ref 0.5–1.9)

## 2022-06-11 LAB — CBG MONITORING, ED: Glucose-Capillary: 198 mg/dL — ABNORMAL HIGH (ref 70–99)

## 2022-06-11 MED ORDER — DILTIAZEM HCL ER COATED BEADS 120 MG PO CP24
240.0000 mg | ORAL_CAPSULE | Freq: Every day | ORAL | Status: DC
  Administered 2022-06-12 – 2022-06-15 (×4): 240 mg via ORAL
  Filled 2022-06-11: qty 2
  Filled 2022-06-11: qty 1
  Filled 2022-06-11 (×2): qty 2

## 2022-06-11 MED ORDER — MORPHINE SULFATE (PF) 4 MG/ML IV SOLN: 4.0000 mg | INTRAVENOUS | Status: DC | PRN

## 2022-06-11 MED ORDER — HYDROCODONE-ACETAMINOPHEN 5-325 MG PO TABS
1.0000 | ORAL_TABLET | Freq: Four times a day (QID) | ORAL | Status: DC | PRN
  Administered 2022-06-12 – 2022-06-15 (×5): 1 via ORAL
  Filled 2022-06-11 (×4): qty 1

## 2022-06-11 MED ORDER — INSULIN ASPART 100 UNIT/ML IJ SOLN: 0.0000 [IU] | Freq: Three times a day (TID) | INTRAMUSCULAR | Status: DC

## 2022-06-11 MED ORDER — GABAPENTIN 300 MG PO CAPS
300.0000 mg | ORAL_CAPSULE | Freq: Every day | ORAL | Status: DC
  Administered 2022-06-11 – 2022-06-14 (×4): 300 mg via ORAL
  Filled 2022-06-11 (×4): qty 1

## 2022-06-11 MED ORDER — INSULIN ASPART 100 UNIT/ML IJ SOLN
0.0000 [IU] | Freq: Three times a day (TID) | INTRAMUSCULAR | Status: DC
  Administered 2022-06-11 – 2022-06-13 (×2): 2 [IU] via SUBCUTANEOUS
  Administered 2022-06-13 (×2): 1 [IU] via SUBCUTANEOUS
  Administered 2022-06-14 – 2022-06-15 (×4): 2 [IU] via SUBCUTANEOUS
  Filled 2022-06-11 (×8): qty 1

## 2022-06-11 MED ORDER — HEPARIN (PORCINE) 25000 UT/250ML-% IV SOLN
750.0000 [IU]/h | INTRAVENOUS | Status: DC
  Administered 2022-06-11: 1000 [IU]/h via INTRAVENOUS
  Administered 2022-06-12: 850 [IU]/h via INTRAVENOUS
  Administered 2022-06-14 (×2): 750 [IU]/h via INTRAVENOUS
  Filled 2022-06-11 (×3): qty 250

## 2022-06-11 MED ORDER — ACETAMINOPHEN 325 MG PO TABS: 650.0000 mg | ORAL_TABLET | Freq: Four times a day (QID) | ORAL | Status: DC | PRN

## 2022-06-11 MED ORDER — MORPHINE SULFATE (PF) 4 MG/ML IV SOLN
4.0000 mg | INTRAVENOUS | Status: DC | PRN
  Administered 2022-06-11: 4 mg via INTRAVENOUS
  Filled 2022-06-11: qty 1

## 2022-06-11 MED ORDER — HEPARIN BOLUS VIA INFUSION
3700.0000 [IU] | Freq: Once | INTRAVENOUS | Status: AC
  Administered 2022-06-11: 3700 [IU] via INTRAVENOUS
  Filled 2022-06-11: qty 3700

## 2022-06-11 MED ORDER — LACTATED RINGERS IV BOLUS
1000.0000 mL | Freq: Once | INTRAVENOUS | Status: AC
  Administered 2022-06-11: 1000 mL via INTRAVENOUS

## 2022-06-11 MED ORDER — SODIUM CHLORIDE 0.9% FLUSH
3.0000 mL | Freq: Two times a day (BID) | INTRAVENOUS | Status: DC
  Administered 2022-06-12 – 2022-06-15 (×7): 3 mL via INTRAVENOUS

## 2022-06-11 MED ORDER — ACETAMINOPHEN 650 MG RE SUPP: 650.0000 mg | Freq: Four times a day (QID) | RECTAL | Status: DC | PRN

## 2022-06-11 MED ORDER — ONDANSETRON HCL 4 MG/2ML IJ SOLN
4.0000 mg | Freq: Four times a day (QID) | INTRAMUSCULAR | Status: DC | PRN
  Administered 2022-06-13: 4 mg via INTRAVENOUS
  Filled 2022-06-11: qty 2

## 2022-06-11 MED ORDER — ONDANSETRON HCL 4 MG PO TABS: 4.0000 mg | ORAL_TABLET | Freq: Four times a day (QID) | ORAL | Status: DC | PRN

## 2022-06-11 MED ORDER — ASPIRIN 81 MG PO TBEC: 81.0000 mg | DELAYED_RELEASE_TABLET | Freq: Every day | ORAL | Status: DC

## 2022-06-11 MED ORDER — SENNOSIDES-DOCUSATE SODIUM 8.6-50 MG PO TABS: 1.0000 | ORAL_TABLET | Freq: Every evening | ORAL | Status: DC | PRN

## 2022-06-11 MED ORDER — METRONIDAZOLE 500 MG/100ML IV SOLN
500.0000 mg | Freq: Two times a day (BID) | INTRAVENOUS | Status: DC
  Administered 2022-06-11 – 2022-06-13 (×4): 500 mg via INTRAVENOUS
  Filled 2022-06-11 (×4): qty 100

## 2022-06-11 MED ORDER — SODIUM CHLORIDE 0.9 % IV SOLN
2.0000 g | INTRAVENOUS | Status: DC
  Administered 2022-06-11 – 2022-06-14 (×4): 2 g via INTRAVENOUS
  Filled 2022-06-11: qty 2
  Filled 2022-06-11 (×2): qty 20
  Filled 2022-06-11: qty 2
  Filled 2022-06-11: qty 20

## 2022-06-11 MED ORDER — ATORVASTATIN CALCIUM 20 MG PO TABS
40.0000 mg | ORAL_TABLET | Freq: Every day | ORAL | Status: DC
  Administered 2022-06-11 – 2022-06-12 (×2): 40 mg via ORAL
  Filled 2022-06-11 (×2): qty 2

## 2022-06-11 MED ORDER — ASPIRIN 325 MG PO TABS
325.0000 mg | ORAL_TABLET | Freq: Every day | ORAL | Status: DC
  Administered 2022-06-11 – 2022-06-13 (×3): 325 mg via ORAL
  Filled 2022-06-11 (×4): qty 1

## 2022-06-11 MED ORDER — LISINOPRIL 20 MG PO TABS
40.0000 mg | ORAL_TABLET | Freq: Every day | ORAL | Status: DC
  Administered 2022-06-12 – 2022-06-15 (×3): 40 mg via ORAL
  Filled 2022-06-11: qty 2
  Filled 2022-06-11: qty 4
  Filled 2022-06-11 (×2): qty 2

## 2022-06-11 MED ORDER — INSULIN GLARGINE-YFGN 100 UNIT/ML ~~LOC~~ SOLN
5.0000 [IU] | Freq: Every day | SUBCUTANEOUS | Status: DC
  Administered 2022-06-12: 5 [IU] via SUBCUTANEOUS
  Filled 2022-06-11 (×2): qty 0.05

## 2022-06-11 NOTE — Consult Note (Signed)
ANTICOAGULATION CONSULT NOTE - Initial Consult  Pharmacy Consult for heparin drip Indication:  limb ischemia  No Known Allergies  Patient Measurements: Height: '5\' 7"'$  (170.2 cm) Weight: 63 kg (139 lb) IBW/kg (Calculated) : 66.1 Heparin Dosing Weight: 63 kg  Vital Signs: Temp: 98.2 F (36.8 C) (01/07 1544) Temp Source: Oral (01/07 1544) BP: 172/71 (01/07 1544) Pulse Rate: 93 (01/07 1544)  Labs: Recent Labs    06/11/22 1547  HGB 17.8*  HCT 54.0*  PLT 187  CREATININE 1.28*    Estimated Creatinine Clearance: 32.9 mL/min (A) (by C-G formula based on SCr of 1.28 mg/dL (H)).   Medical History: Past Medical History:  Diagnosis Date   Diabetes mellitus without complication (HCC)    Elevated cholesterol    Hypertension     Medications:  No anticoagulation per pharmacist review  Assessment: 89 yo male presented to ED with complaint of toe pain.  Pharmacy consulted to start heparin drip due to concern of limb ischemia.  Baseline aPTT and PT/INR are pending.  Goal of Therapy:  Heparin level 0.3-0.7 units/ml Monitor platelets by anticoagulation protocol: Yes   Plan:  Give 3700 units bolus x 1 Start heparin infusion at 1000 units/hr Check anti-Xa level in 8 hours and daily while on heparin Continue to monitor H&H and platelets  Lorin Picket, PharmD 06/11/2022,6:10 PM

## 2022-06-11 NOTE — Assessment & Plan Note (Addendum)
-   Lipid panel pending - Start atorvastatin 40 mg daily - Discontinue home simvastatin

## 2022-06-11 NOTE — H&P (View-Only) (Signed)
Reason for Consult: Dusky left toes 2 and 4, necrotic third toe tip Referring Physician: Dr. Merrily Brittle Charles Jennings is an 89 y.o. male.  HPI: This is a very nice 89 year old gentleman who previously was walking 5 miles daily at a 15 minute per mile clip.  He has a history of bilateral common femoral artery in the remote past.  Vascular risk factors include hypertension, diabetes mellitus, dyslipidemia.  He had a podiatric procedure on 09 April 2022 for nail care on the left, has subsequently not been able to walk due to tenderness.  He has some neuropathy but not dense.  His toes are tender.  He comes in today due to discoloration.  He has not had any formal imaging.  He has a history of heart block, no history of atrial fibrillation.  Past Medical History:  Diagnosis Date   Diabetes mellitus without complication (HCC)    Elevated cholesterol    Hypertension     Past Surgical History:  Procedure Laterality Date   FEMORAL ENDARTERECTOMY Bilateral    HEMORRHOID BANDING      History reviewed. No pertinent family history.  Social History:  reports that he has quit smoking. His smoking use included cigarettes. He has never used smokeless tobacco. He reports that he does not currently use alcohol. He reports that he does not use drugs.  Allergies: No Known Allergies  Medications: I have reviewed the patient's current medications.  Results for orders placed or performed during the hospital encounter of 06/11/22 (from the past 48 hour(s))  Lactic acid, plasma     Status: None   Collection Time: 06/11/22  3:47 PM  Result Value Ref Range   Lactic Acid, Venous 1.9 0.5 - 1.9 mmol/L    Comment: Performed at [facility], [address], [place], [place] [address]  Comprehensive metabolic panel     Status: Abnormal   Collection Time: 06/11/22  3:47 PM  Result Value Ref Range   Sodium 131 (L) 135 - 145 mmol/L   Potassium 4.4 3.5 - 5.1 mmol/L   Chloride 95 (L) 98 - 111 mmol/L    CO2 22 22 - 32 mmol/L   Glucose, Bld 275 (H) 70 - 99 mg/dL    Comment: Glucose reference range applies only to samples taken after fasting for at least 8 hours.   BUN 27 (H) 8 - 23 mg/dL   Creatinine, Ser 1.28 (H) 0.61 - 1.24 mg/dL   Calcium 9.4 8.9 - 10.3 mg/dL   Total Protein 7.5 6.5 - 8.1 g/dL   Albumin 4.1 3.5 - 5.0 g/dL   AST 15 15 - 41 U/L   ALT 12 0 - 44 U/L   Alkaline Phosphatase 188 (H) 38 - 126 U/L   Total Bilirubin 1.1 0.3 - 1.2 mg/dL   GFR, Estimated 53 (L) >60 mL/min    Comment: (NOTE) Calculated using the CKD-EPI Creatinine Equation (2021)    Anion gap 14 5 - 15    Comment: Performed at [facility], [address] [place], [place] [address]  CBC with Differential     Status: Abnormal   Collection Time: 06/11/22  3:47 PM  Result Value Ref Range   WBC 9.5 4.0 - 10.5 K/uL   RBC 6.00 (H) 4.22 - 5.81 MIL/uL   Hemoglobin 17.8 (H) 13.0 - 17.0 g/dL   HCT 54.0 (H) 39.0 - 52.0 %   MCV 90.0 80.0 - 100.0 fL   MCH 29.7 26.0 - 34.0 pg  MCHC 33.0 30.0 - 36.0 g/dL   RDW 13.1 11.5 - 15.5 %   Platelets 187 150 - 400 K/uL   nRBC 0.0 0.0 - 0.2 %   Neutrophils Relative % 70 %   Neutro Abs 6.6 1.7 - 7.7 K/uL   Lymphocytes Relative 20 %   Lymphs Abs 1.9 0.7 - 4.0 K/uL   Monocytes Relative 10 %   Monocytes Absolute 1.0 0.1 - 1.0 K/uL   Eosinophils Relative 0 %   Eosinophils Absolute 0.0 0.0 - 0.5 K/uL   Basophils Relative 0 %   Basophils Absolute 0.0 0.0 - 0.1 K/uL   Immature Granulocytes 0 %   Abs Immature Granulocytes 0.03 0.00 - 0.07 K/uL    Comment: Performed at [facility], [address] [place], [place] [address]  Sedimentation rate     Status: None   Collection Time: 06/11/22  4:00 PM  Result Value Ref Range   Sed Rate 4 0 - 20 mm/hr    Comment: Performed at [facility], [address] [place], [place] [address]    DG Foot Complete Left  Result Date: 06/11/2022 CLINICAL DATA:  Foot pain, necrotic toes EXAM: LEFT FOOT - COMPLETE  3+ VIEW COMPARISON:  None Available. FINDINGS: Bones are diffusely osteopenic. No evidence for fracture, dislocation or cortical destruction. There are mild degenerative changes of the second distal interphalangeal joint. IMPRESSION: 1. No acute osseous abnormality. 2. Osteopenia. Electronically Signed   By: Ronney Asters M.D.   On: 06/11/2022 16:36    Review of Systems Blood pressure (!) 172/71, pulse 93, temperature 98.2 F (36.8 C), temperature source Oral, resp. rate 18, height '5\' 7"'$  (1.702 m), weight 63 kg, SpO2 100 %. Physical Exam On physical examination, he is awake, alert, oriented x 3.  He is in no acute distress.  Face is symmetric, normocephalic atraumatic.  Upper extremities are symmetric with good pulses.  There is no respiratory embarrassment or accessory muscle use.  He has a pacemaker in place.  Abdomen is soft, scaphoid, nontender.  No pulsatile mass.  He has well-healed femoral incisions, 1-2+ femoral pulse on the right, absent on the left.  I cannot appreciate popliteal or pedal pulses.  Both feet are equally warm, with capillary refill that is roughly 3 seconds.  Toes on the left foot are as described in HPI, dusky 2 and 4, necrotic at the tip of the third toe.   Assessment/Plan: 1.  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  His ambulatory distance is remarkably preserved.  Heparin drip has been instituted, he is on aspirin and low-dose statin as an outpatient, plan would be for arteriogram and potential intervention on single or multisegment level tomorrow.  I discussed this with him in detail.  He understands the rationale for this.  He remembers the details of arteriogram given he had this done before his endarterectomies in the remote past.  All questions were answered.  Zara Chess 06/11/2022, 6:48 PM

## 2022-06-11 NOTE — ED Triage Notes (Signed)
Pt states he went to the podiatrist who cut his toenails and cut them too close to the quick on the 5th of November - pt states he got 3 of them to heal but one of them is throbbing and painful and pain goes all the way up into his L calf- pt second toes is purple on the tip and the third toe is dark purple/black on the tip

## 2022-06-11 NOTE — H&P (Addendum)
History and Physical    Patient: Charles Jennings EQA:[id number] DOB: 1927-12-27 DOA: 06/11/2022 DOS: the patient was seen and examined on 06/11/2022 PCP: Albina Billet, MD  Patient coming from: Home  Chief Complaint:  Chief Complaint  Patient presents with   Toe Pain   HPI: Charles Jennings is a 89 y.o. male with medical history significant of HFpEF, complete heart block s/p PPM, hypertension, hyperlipidemia, type 2 diabetes, PAD, who presents to the ED with complaints of toe pain.  [name] states he had his toe nails cut on 04/13/2022 at his podiatrist's office. Shortly thereafter, he noticed that his left toes appeared irritated and he was concerned for infection. Since then, he has been cleaning his feet daily with soap/water, hydrogen peroxide and using triple antibacterial ointment. He felt all his toes were improving except the middle left toe. In the past 1 week, he has noticed significant swelling of his left foot and it has been progressively more painful to walk.  Due to this, he came to the ED today.  He denies any fever, chills.  He denies any erythema going into his foot.  He denies any purulent drainage but states that he believes some of the dark area on his middle toes dried blood.  ED course: On arrival to the ED, patient was afebrile 98.2 with heart rate 93 and blood pressure 172/71. Initial workup remarkable for WBC of 9.5, hemoglobin of 17.8, sodium of 131, glucose of 275, BUN of 27, creatinine 1.28, alkaline phosphatase of 188 and a calculated GFR 53.  Lactic acid within normal limits.  ESR within normal limits.  Foot x-ray was obtained that did not show any evidence of bony destruction.  Due to concern for critical limb ischemia, vascular surgery was consulted and [facility] was contacted for admission.  Review of Systems: As mentioned in the history of present illness. All other systems reviewed and are negative.  Past Medical History:  Diagnosis Date   Diabetes mellitus  without complication (HCC)    Elevated cholesterol    Hypertension    Past Surgical History:  Procedure Laterality Date   FEMORAL ENDARTERECTOMY Bilateral    HEMORRHOID BANDING     Social History:  reports that he has quit smoking. His smoking use included cigarettes. He has never used smokeless tobacco. He reports that he does not currently use alcohol. He reports that he does not use drugs.  No Known Allergies  History reviewed. No pertinent family history.  Prior to Admission medications   Medication Sig Start Date End Date Taking? Authorizing Provider  ASPIR-LOW 81 MG EC tablet Take 1 tablet by mouth daily. 11/30/15   [provider]  diltiazem (TIAZAC) 180 MG 24 hr capsule Take 1 capsule by mouth daily. 11/30/15   [provider]  lisinopril (PRINIVIL,ZESTRIL) 30 MG tablet Take 1 tablet by mouth daily. 11/30/15   [provider]  loratadine (CLARITIN) 10 MG tablet Take 1 tablet by mouth daily. 11/30/15   [provider]  metFORMIN (GLUCOPHAGE) 500 MG tablet  11/30/15   [provider]  NOVOLIN 70/30 (70-30) 100 UNIT/ML injection 10 Units at bedtime.  11/30/15   [provider]  pentoxifylline (TRENTAL) 400 MG CR tablet Take 1 tablet by mouth 3 (three) times daily. 11/30/15   [provider]  ranitidine (ZANTAC) 150 MG tablet Take 2 tablets by mouth daily. 11/30/15   [provider]  simvastatin (ZOCOR) 10 MG tablet Take 1 tablet by mouth daily. 11/30/15  [provider]    Physical Exam: Vitals:   06/11/22 1541 06/11/22 1544  BP:  (!) 172/71  Pulse:  93  Resp:  18  Temp:  98.2 F (36.8 C)  TempSrc:  Oral  SpO2:  100%  Weight: 63 kg   Height: '5\' 7"'$  (1.702 m)    Physical Exam Vitals and nursing note reviewed.  Constitutional:      General: He is not in acute distress.    Appearance: He is normal weight. He is not toxic-appearing.  HENT:     Head: Normocephalic and atraumatic.     Mouth/Throat:      Mouth: Mucous membranes are moist.     Pharynx: Oropharynx is clear.  Eyes:     Conjunctiva/sclera: Conjunctivae normal.     Pupils: Pupils are equal, round, and reactive to light.  Cardiovascular:     Rate and Rhythm: Normal rate and regular rhythm.     Pulses:          Dorsalis pedis pulses are 0 on the right side and 0 on the left side.       Posterior tibial pulses are 0 on the right side and 0 on the left side.     Heart sounds: No murmur heard.    No gallop.  Pulmonary:     Effort: Pulmonary effort is normal. No respiratory distress.     Breath sounds: Normal breath sounds. No wheezing, rhonchi or rales.  Abdominal:     General: Bowel sounds are normal.     Palpations: Abdomen is soft.  Musculoskeletal:     Cervical back: Neck supple.     Right lower leg: No edema.     Left lower leg: No edema.  Feet:     Comments: On the distal end of the left middle phalanx, there is an area of dried blood surrounding the end of the nail, however on the plantar aspect of the toe, there is an area of necrosis hat is extending into the medial aspect.  Significant tenderness to palpation and due to this, unable to palpate any fluctuance.  Erythema noted surrounding the toe extending into the foot.  Significant nonpitting edema of the left foot on the dorsal aspect. Skin:    General: Skin is warm and dry.  Neurological:     General: No focal deficit present.     Mental Status: He is alert and oriented to person, place, and time.  Psychiatric:        Mood and Affect: Mood normal.        Behavior: Behavior normal.           Data Reviewed: CBC with WBC 9.5, hemoglobin of 17.8, MCV 90 and platelets of 187 CMP with sodium of 131, potassium 4.4, chloride 95, bicarb 22, glucose 275, BUN 27, creatinine 1.28, alkaline phosphatase 188, AST 15, ALT 12, GFR 53 Lactic acid within normal limits at 1.9 ESR within normal limits at 4  DG Foot Complete Left  Result Date: 06/11/2022 CLINICAL  DATA:  Foot pain, necrotic toes EXAM: LEFT FOOT - COMPLETE 3+ VIEW COMPARISON:  None Available. FINDINGS: Bones are diffusely osteopenic. No evidence for fracture, dislocation or cortical destruction. There are mild degenerative changes of the second distal interphalangeal joint. IMPRESSION: 1. No acute osseous abnormality. 2. Osteopenia. Electronically Signed   By: Ronney Asters M.D.   On: 06/11/2022 16:36    There are no new results to review at this time.  Assessment and Plan:  *  Limb ischemia Given lack of distal pulses in the left lower extremity, vascular surgeon has been consulted and suspects limb ischemia.  On examination, there is an area of ulceration with surrounding erythema and significant tenderness to palpation.  I am concerned for an overlying infection.  Due to this, will start broad-spectrum antibiotics.  - Vascular surgery consulted; appreciate their recommendations - Heparin infusion per pharmacy dosing - Tentative plan for arteriogram tomorrow morning - Start ceftriaxone and metronidazole - No indication for vancomycin at this time.  Will obtain a MRSA PCR, and if positive, will start at that time - CT without contrast of the left foot to rule out osteo  Diabetes mellitus ([place]) - Hold home NovoLog 70/30, and Jardiance - A1c pending - Semglee 5 units prior to bedtime - SSI, sensitive  Mixed hyperlipidemia - Lipid panel pending - Start atorvastatin 40 mg daily - Discontinue home simvastatin  Benign essential HTN Patient is hypertensive on presentation, however asymptomatic.    - Resume home antihypertensives  CKD (chronic kidney disease) Creatinine is elevated on admission at 1.28 with prior creatinine 7 years ago of 1.02.  I suspect this is chronic in nature.  No history or examination findings to suspect AKI  - Recheck BMP in the a.m.  Advance Care Planning:   Code Status: DNR.  Patient states that in the event of cardiac arrest, he would not want to be  resuscitated.  However in the case of pulmonary arrest only, he is open to a short trial of mechanical ventilation if necessary.  He would not want to be on long-term life support if there is no chance for meaningful recovery.  Consults: Vascular surgery  Family Communication: No family at bedside  Severity of Illness: The appropriate patient status for this patient is INPATIENT. Inpatient status is judged to be reasonable and necessary in order to provide the required intensity of service to ensure the patient's safety. [name] presenting symptoms, physical exam findings, and initial radiographic and laboratory data in the context of their chronic comorbidities is felt to place them at high risk for further clinical deterioration. Furthermore, it is not anticipated that the patient will be medically stable for discharge from the hospital within 2 midnights of admission.   * I certify that at the point of admission it is my clinical judgment that the patient will require inpatient hospital care spanning beyond 2 midnights from the point of admission due to high intensity of service, high risk for further deterioration and high frequency of surveillance required.*  Author: Jose Persia, MD 06/11/2022 7:38 PM  For on call review [email or web address].

## 2022-06-11 NOTE — Assessment & Plan Note (Signed)
-   Hold home NovoLog 70/30, and Jardiance - A1c pending - Semglee 5 units prior to bedtime - SSI, sensitive

## 2022-06-11 NOTE — ED Provider Notes (Signed)
[facility] Provider Note    Event Date/Time   First MD Initiated Contact with Patient 06/11/22 1559     (approximate)   History   Chief Complaint: Toe Pain   HPI  Charles Jennings is a 89 y.o. male with a history of diabetes hypertension CHF PAD who comes ED complaining of worsening left foot toe pain and discoloration over the past week.  Pain is 5/10, but increases to 9/10 when trying to bear any weight.  Reports that symptoms started a few days after he went to podiatry and had nails debrided.  Not on blood thinners.     Physical Exam   Triage Vital Signs: ED Triage Vitals  Enc Vitals Group     BP 06/11/22 1544 (!) 172/71     Pulse Rate 06/11/22 1544 93     Resp 06/11/22 1544 18     Temp 06/11/22 1544 98.2 F (36.8 C)     Temp Source 06/11/22 1544 Oral     SpO2 06/11/22 1544 100 %     Weight 06/11/22 1541 139 lb (63 kg)     Height 06/11/22 1541 '5\' 7"'$  (1.702 m)     Head Circumference --      Peak Flow --      Pain Score 06/11/22 1541 5     Pain Loc --      Pain Edu? --      Excl. in [place]? --     Most recent vital signs: Vitals:   06/11/22 1544 06/11/22 2033  BP: (!) 172/71 (!) 146/73  Pulse: 93 81  Resp: 18 16  Temp: 98.2 F (36.8 C) 98.1 F (36.7 C)  SpO2: 100% 96%    General: Awake, no distress.  CV:  Regular rate.  Normal radial pulses.  No palpable DP or PT pulse in either foot.  Popliteal pulse nonpalpable.  With Doppler interrogation, there is a strong Doppler signal.  No Doppler PT or DP signal in the left foot.  Cap refill is sluggish.  Left second toe is purple.  Left third toe is black and necrotic with distal Ulceration.  Left fourth toe is purple.  No gross pallor or coldness of the extremities. Resp:  Normal effort.  Abd:  No distention.  Other:  No edema.   ED Results / Procedures / Treatments   Labs (all labs ordered are listed, but only abnormal results are displayed) Labs Reviewed  COMPREHENSIVE METABOLIC  PANEL - Abnormal; Notable for the following components:      Result Value   Sodium 131 (*)    Chloride 95 (*)    Glucose, Bld 275 (*)    BUN 27 (*)    Creatinine, Ser 1.28 (*)    Alkaline Phosphatase 188 (*)    GFR, Estimated 53 (*)    All other components within normal limits  CBC WITH DIFFERENTIAL/PLATELET - Abnormal; Notable for the following components:   RBC 6.00 (*)    Hemoglobin 17.8 (*)    HCT 54.0 (*)    All other components within normal limits  CBG MONITORING, ED - Abnormal; Notable for the following components:   Glucose-Capillary 198 (*)    All other components within normal limits  MRSA NEXT GEN BY PCR, NASAL  LACTIC ACID, PLASMA  SEDIMENTATION RATE  APTT  PROTIME-INR  LIPID PANEL  CBC  BASIC METABOLIC PANEL  HEMOGLOBIN A1C  C-REACTIVE PROTEIN  HEPARIN LEVEL (UNFRACTIONATED)     EKG  RADIOLOGY X-ray left foot interpreted by me, appears normal.  Radiology report reviewed   PROCEDURES:  .Critical Care  Performed by: Carrie Mew, MD Authorized by: Carrie Mew, MD   Critical care provider statement:    Critical care time (minutes):  35   Critical care time was exclusive of:  Separately billable procedures and treating other patients   Critical care was necessary to treat or prevent imminent or life-threatening deterioration of the following conditions:  Circulatory failure   Critical care was time spent personally by me on the following activities:  Development of treatment plan with patient or surrogate, discussions with consultants, evaluation of patient's response to treatment, examination of patient, obtaining history from patient or surrogate, ordering and performing treatments and interventions, ordering and review of laboratory studies, ordering and review of radiographic studies, pulse oximetry, re-evaluation of patient's condition and review of old charts   Care discussed with: admitting provider      [place]  ED: Medications  heparin bolus via infusion 3,700 Units (3,700 Units Intravenous Bolus from Bag 06/11/22 2140)    Followed by  heparin ADULT infusion 100 units/mL (25000 units/273m) (1,000 Units/hr Intravenous New Bag/Given 06/11/22 2140)  aspirin tablet 325 mg (325 mg Oral Given 06/11/22 2103)  atorvastatin (LIPITOR) tablet 40 mg (40 mg Oral Given 06/11/22 2103)  sodium chloride flush (NS) 0.9 % injection 3 mL (3 mLs Intravenous Not Given 06/11/22 2146)  acetaminophen (TYLENOL) tablet 650 mg (has no administration in time range)    Or  acetaminophen (TYLENOL) suppository 650 mg (has no administration in time range)  HYDROcodone-acetaminophen (NORCO/VICODIN) 5-325 MG per tablet 1 tablet (has no administration in time range)  senna-docusate (Senokot-S) tablet 1 tablet (has no administration in time range)  ondansetron (ZOFRAN) tablet 4 mg (has no administration in time range)    Or  ondansetron (ZOFRAN) injection 4 mg (has no administration in time range)  morphine (PF) 4 MG/ML injection 4 mg (4 mg Intravenous Given 06/11/22 2134)  insulin glargine-yfgn (SEMGLEE) injection 5 Units (5 Units Subcutaneous Patient Refused/Not Given 06/11/22 2226)  cefTRIAXone (ROCEPHIN) 2 g in sodium chloride 0.9 % 100 mL IVPB (0 g Intravenous Stopped 06/11/22 2132)    And  metroNIDAZOLE (FLAGYL) IVPB 500 mg (500 mg Intravenous New Bag/Given 06/11/22 2136)  diltiazem (CARDIZEM CD) 24 hr capsule 240 mg (has no administration in time range)  lisinopril (ZESTRIL) tablet 40 mg (has no administration in time range)  gabapentin (NEURONTIN) capsule 300 mg (300 mg Oral Given 06/11/22 2132)  insulin aspart (novoLOG) injection 0-9 Units (2 Units Subcutaneous Given 06/11/22 2132)  lactated ringers bolus 1,000 mL (0 mLs Intravenous Stopped 06/11/22 2132)     IMPRESSION / [place] / [place]/ ED COURSE  I reviewed the triage vital signs and the nursing notes.                              Differential diagnosis includes, but is not  limited to, limb ischemia, osteomyelitis, cellulitis, diabetic wound  Patient's presentation is most consistent with acute presentation with potential threat to life or bodily function.  Patient presents with discoloration and necrosis of the second third and fourth toes on the left.  X-ray unremarkable, no fever, no leukocytosis, ESR is normal.  Doppler evaluation of the arterial system of the left leg demonstrates very poor blood flow.  I think that his foot pain and toe necrosis is due to ischemia  from arterial insufficiency.  Doubt infection right now.  Case discussed with vascular surgery Dr. Shelia Media who recommends heparin and hospitalization.  N.p.o. after midnight for vascular lab angiography.       FINAL CLINICAL IMPRESSION(S) / ED DIAGNOSES   Final diagnoses:  Critical limb ischemia of left lower extremity with ulceration of foot ([place])     Rx / DC Orders   ED Discharge Orders     None        Note:  This document was prepared using Dragon voice recognition software and may include unintentional dictation errors.   Carrie Mew, MD 06/11/22 2235

## 2022-06-11 NOTE — Consult Note (Signed)
Reason for Consult: Dusky left toes 2 and 4, necrotic third toe tip Referring Physician: Dr. Merrily Brittle Charles Jennings is an 89 y.o. male.  HPI: This is a very nice 89 year old gentleman who previously was walking 5 miles daily at a 15 minute per mile clip.  He has a history of bilateral common femoral artery in the remote past.  Vascular risk factors include hypertension, diabetes mellitus, dyslipidemia.  He had a podiatric procedure on 09 April 2022 for nail care on the left, has subsequently not been able to walk due to tenderness.  He has some neuropathy but not dense.  His toes are tender.  He comes in today due to discoloration.  He has not had any formal imaging.  He has a history of heart block, no history of atrial fibrillation.  Past Medical History:  Diagnosis Date   Diabetes mellitus without complication (HCC)    Elevated cholesterol    Hypertension     Past Surgical History:  Procedure Laterality Date   FEMORAL ENDARTERECTOMY Bilateral    HEMORRHOID BANDING      History reviewed. No pertinent family history.  Social History:  reports that he has quit smoking. His smoking use included cigarettes. He has never used smokeless tobacco. He reports that he does not currently use alcohol. He reports that he does not use drugs.  Allergies: No Known Allergies  Medications: I have reviewed the patient's current medications.  Results for orders placed or performed during the hospital encounter of 06/11/22 (from the past 48 hour(s))  Lactic acid, plasma     Status: None   Collection Time: 06/11/22  3:47 PM  Result Value Ref Range   Lactic Acid, Venous 1.9 0.5 - 1.9 mmol/L    Comment: Performed at [facility], [address] [place], [place] [address]  Comprehensive metabolic panel     Status: Abnormal   Collection Time: 06/11/22  3:47 PM  Result Value Ref Range   Sodium 131 (L) 135 - 145 mmol/L   Potassium 4.4 3.5 - 5.1 mmol/L   Chloride 95 (L) 98 - 111 mmol/L    CO2 22 22 - 32 mmol/L   Glucose, Bld 275 (H) 70 - 99 mg/dL    Comment: Glucose reference range applies only to samples taken after fasting for at least 8 hours.   BUN 27 (H) 8 - 23 mg/dL   Creatinine, Ser 1.28 (H) 0.61 - 1.24 mg/dL   Calcium 9.4 8.9 - 10.3 mg/dL   Total Protein 7.5 6.5 - 8.1 g/dL   Albumin 4.1 3.5 - 5.0 g/dL   AST 15 15 - 41 U/L   ALT 12 0 - 44 U/L   Alkaline Phosphatase 188 (H) 38 - 126 U/L   Total Bilirubin 1.1 0.3 - 1.2 mg/dL   GFR, Estimated 53 (L) >60 mL/min    Comment: (NOTE) Calculated using the CKD-EPI Creatinine Equation (2021)    Anion gap 14 5 - 15    Comment: Performed at [facility], [address] [place], [place] [address]  CBC with Differential     Status: Abnormal   Collection Time: 06/11/22  3:47 PM  Result Value Ref Range   WBC 9.5 4.0 - 10.5 K/uL   RBC 6.00 (H) 4.22 - 5.81 MIL/uL   Hemoglobin 17.8 (H) 13.0 - 17.0 g/dL   HCT 54.0 (H) 39.0 - 52.0 %   MCV 90.0 80.0 - 100.0 fL   MCH 29.7 26.0 - 34.0 pg  MCHC 33.0 30.0 - 36.0 g/dL   RDW 13.1 11.5 - 15.5 %   Platelets 187 150 - 400 K/uL   nRBC 0.0 0.0 - 0.2 %   Neutrophils Relative % 70 %   Neutro Abs 6.6 1.7 - 7.7 K/uL   Lymphocytes Relative 20 %   Lymphs Abs 1.9 0.7 - 4.0 K/uL   Monocytes Relative 10 %   Monocytes Absolute 1.0 0.1 - 1.0 K/uL   Eosinophils Relative 0 %   Eosinophils Absolute 0.0 0.0 - 0.5 K/uL   Basophils Relative 0 %   Basophils Absolute 0.0 0.0 - 0.1 K/uL   Immature Granulocytes 0 %   Abs Immature Granulocytes 0.03 0.00 - 0.07 K/uL    Comment: Performed at [facility], [address] [place], [place] [address]  Sedimentation rate     Status: None   Collection Time: 06/11/22  4:00 PM  Result Value Ref Range   Sed Rate 4 0 - 20 mm/hr    Comment: Performed at [facility], [address] [place], [place] [address]    DG Foot Complete Left  Result Date: 06/11/2022 CLINICAL DATA:  Foot pain, necrotic toes EXAM: LEFT FOOT - COMPLETE  3+ VIEW COMPARISON:  None Available. FINDINGS: Bones are diffusely osteopenic. No evidence for fracture, dislocation or cortical destruction. There are mild degenerative changes of the second distal interphalangeal joint. IMPRESSION: 1. No acute osseous abnormality. 2. Osteopenia. Electronically Signed   By: Ronney Asters M.D.   On: 06/11/2022 16:36    Review of Systems Blood pressure (!) 172/71, pulse 93, temperature 98.2 F (36.8 C), temperature source Oral, resp. rate 18, height '5\' 7"'$  (1.702 m), weight 63 kg, SpO2 100 %. Physical Exam On physical examination, he is awake, alert, oriented x 3.  He is in no acute distress.  Face is symmetric, normocephalic atraumatic.  Upper extremities are symmetric with good pulses.  There is no respiratory embarrassment or accessory muscle use.  He has a pacemaker in place.  Abdomen is soft, scaphoid, nontender.  No pulsatile mass.  He has well-healed femoral incisions, 1-2+ femoral pulse on the right, absent on the left.  I cannot appreciate popliteal or pedal pulses.  Both feet are equally warm, with capillary refill that is roughly 3 seconds.  Toes on the left foot are as described in HPI, dusky 2 and 4, necrotic at the tip of the third toe.   Assessment/Plan: 1.  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  His ambulatory distance is remarkably preserved.  Heparin drip has been instituted, he is on aspirin and low-dose statin as an outpatient, plan would be for arteriogram and potential intervention on single or multisegment level tomorrow.  I discussed this with him in detail.  He understands the rationale for this.  He remembers the details of arteriogram given he had this done before his endarterectomies in the remote past.  All questions were answered.  Zara Chess 06/11/2022, 6:48 PM

## 2022-06-11 NOTE — Assessment & Plan Note (Addendum)
Creatinine is elevated on admission at 1.28 with prior creatinine 7 years ago of 1.02.  I suspect this is chronic in nature.  No history or examination findings to suspect AKI  - Recheck BMP in the a.m.

## 2022-06-11 NOTE — Assessment & Plan Note (Signed)
Given lack of distal pulses in the left lower extremity, vascular surgeon has been consulted and suspects limb ischemia.  On examination, there is an area of ulceration with surrounding erythema and significant tenderness to palpation.  I am concerned for an overlying infection.  Due to this, will start broad-spectrum antibiotics.  - Vascular surgery consulted; appreciate their recommendations - Heparin infusion per pharmacy dosing - Tentative plan for arteriogram tomorrow morning - Start ceftriaxone and metronidazole - No indication for vancomycin at this time.  Will obtain a MRSA PCR, and if positive, will start at that time - CT without contrast of the left foot to rule out osteo

## 2022-06-11 NOTE — Assessment & Plan Note (Signed)
Patient is hypertensive on presentation, however asymptomatic.    - Resume home antihypertensives

## 2022-06-12 ENCOUNTER — Encounter
Admission: EM | Disposition: A | Discharge status: Home / Self Care | Payer: Self-pay | Source: Home / Self Care | Attending: Student in an Organized Health Care Education/Training Program

## 2022-06-12 DIAGNOSIS — Z9889 Other specified postprocedural states: Secondary | ICD-10-CM | POA: Diagnosis not present

## 2022-06-12 DIAGNOSIS — I70262 Atherosclerosis of native arteries of extremities with gangrene, left leg: Secondary | ICD-10-CM

## 2022-06-12 DIAGNOSIS — N182 Chronic kidney disease, stage 2 (mild): Secondary | ICD-10-CM | POA: Diagnosis not present

## 2022-06-12 DIAGNOSIS — E1122 Type 2 diabetes mellitus with diabetic chronic kidney disease: Secondary | ICD-10-CM | POA: Diagnosis not present

## 2022-06-12 DIAGNOSIS — Z794 Long term (current) use of insulin: Secondary | ICD-10-CM | POA: Diagnosis not present

## 2022-06-12 DIAGNOSIS — I998 Other disorder of circulatory system: Secondary | ICD-10-CM | POA: Diagnosis not present

## 2022-06-12 DIAGNOSIS — I70201 Unspecified atherosclerosis of native arteries of extremities, right leg: Secondary | ICD-10-CM | POA: Diagnosis not present

## 2022-06-12 DIAGNOSIS — I70222 Atherosclerosis of native arteries of extremities with rest pain, left leg: Secondary | ICD-10-CM | POA: Diagnosis present

## 2022-06-12 HISTORY — PX: LOWER EXTREMITY ANGIOGRAPHY: CATH118251

## 2022-06-12 LAB — CBC
HCT: 47.9 % (ref 39.0–52.0)
Hemoglobin: 15.4 g/dL (ref 13.0–17.0)
MCH: 29.5 pg (ref 26.0–34.0)
MCHC: 32.2 g/dL (ref 30.0–36.0)
MCV: 91.8 fL (ref 80.0–100.0)
Platelets: 143 10*3/uL — ABNORMAL LOW (ref 150–400)
RBC: 5.22 MIL/uL (ref 4.22–5.81)
RDW: 13 % (ref 11.5–15.5)
WBC: 6.8 10*3/uL (ref 4.0–10.5)
nRBC: 0 % (ref 0.0–0.2)

## 2022-06-12 LAB — BASIC METABOLIC PANEL
Anion gap: 11 (ref 5–15)
BUN: 23 mg/dL (ref 8–23)
CO2: 22 mmol/L (ref 22–32)
Calcium: 8.6 mg/dL — ABNORMAL LOW (ref 8.9–10.3)
Chloride: 100 mmol/L (ref 98–111)
Creatinine, Ser: 1.03 mg/dL (ref 0.61–1.24)
GFR, Estimated: >60 mL/min (ref >60)
Glucose, Bld: 130 mg/dL — ABNORMAL HIGH (ref 70–99)
Potassium: 4.5 mmol/L (ref 3.5–5.1)
Sodium: 133 mmol/L — ABNORMAL LOW (ref 135–145)

## 2022-06-12 LAB — GLUCOSE, CAPILLARY
Glucose-Capillary: 117 mg/dL — ABNORMAL HIGH (ref 70–99)
Glucose-Capillary: 135 mg/dL — ABNORMAL HIGH (ref 70–99)
Glucose-Capillary: 244 mg/dL — ABNORMAL HIGH (ref 70–99)

## 2022-06-12 LAB — CBG MONITORING, ED: Glucose-Capillary: 106 mg/dL — ABNORMAL HIGH (ref 70–99)

## 2022-06-12 LAB — C-REACTIVE PROTEIN: CRP: 0.6 mg/dL (ref <1.0)

## 2022-06-12 LAB — HEPARIN LEVEL (UNFRACTIONATED): Heparin Unfractionated: 0.95 IU/mL — ABNORMAL HIGH (ref 0.30–0.70)

## 2022-06-12 LAB — MRSA NEXT GEN BY PCR, NASAL: MRSA by PCR Next Gen: NOT DETECTED

## 2022-06-12 SURGERY — LOWER EXTREMITY ANGIOGRAPHY
Anesthesia: Moderate Sedation | Laterality: Left

## 2022-06-12 MED ORDER — MIDAZOLAM HCL 2 MG/2ML IJ SOLN
INTRAMUSCULAR | Status: AC
  Filled 2022-06-12: qty 2

## 2022-06-12 MED ORDER — FENTANYL CITRATE (PF) 100 MCG/2ML IJ SOLN
INTRAMUSCULAR | Status: DC | PRN
  Administered 2022-06-12 (×3): 25 ug via INTRAVENOUS

## 2022-06-12 MED ORDER — METHYLPREDNISOLONE SODIUM SUCC 125 MG IJ SOLR: 125.0000 mg | Freq: Once | INTRAMUSCULAR | Status: DC | PRN

## 2022-06-12 MED ORDER — MIDAZOLAM HCL 2 MG/2ML IJ SOLN
INTRAMUSCULAR | Status: DC | PRN
  Administered 2022-06-12 (×3): 1 mg via INTRAVENOUS

## 2022-06-12 MED ORDER — FENTANYL CITRATE PF 50 MCG/ML IJ SOSY: 12.5000 ug | PREFILLED_SYRINGE | Freq: Once | INTRAMUSCULAR | Status: DC | PRN

## 2022-06-12 MED ORDER — SODIUM CHLORIDE 0.9 % IV SOLN
INTRAVENOUS | Status: DC
  Administered 2022-06-12: 250 mL via INTRAVENOUS

## 2022-06-12 MED ORDER — CHLORHEXIDINE GLUCONATE CLOTH 2 % EX PADS
6.0000 | MEDICATED_PAD | Freq: Once | CUTANEOUS | Status: AC
  Administered 2022-06-12: 6 via TOPICAL
  Filled 2022-06-12: qty 6

## 2022-06-12 MED ORDER — FENTANYL CITRATE PF 50 MCG/ML IJ SOSY
PREFILLED_SYRINGE | INTRAMUSCULAR | Status: AC
  Filled 2022-06-12: qty 1

## 2022-06-12 MED ORDER — CEFAZOLIN SODIUM-DEXTROSE 2-4 GM/100ML-% IV SOLN: 2.0000 g | INTRAVENOUS | Status: AC

## 2022-06-12 MED ORDER — HEPARIN SODIUM (PORCINE) 1000 UNIT/ML IJ SOLN
INTRAMUSCULAR | Status: DC | PRN
  Administered 2022-06-12: 5000 [IU] via INTRAVENOUS

## 2022-06-12 MED ORDER — ATORVASTATIN CALCIUM 20 MG PO TABS
20.0000 mg | ORAL_TABLET | Freq: Every evening | ORAL | Status: DC
  Administered 2022-06-12 – 2022-06-14 (×3): 20 mg via ORAL
  Filled 2022-06-12 (×3): qty 1

## 2022-06-12 MED ORDER — HYDROMORPHONE HCL 1 MG/ML IJ SOLN
1.0000 mg | Freq: Once | INTRAMUSCULAR | Status: AC | PRN
  Administered 2022-06-13: 1 mg via INTRAVENOUS
  Filled 2022-06-12: qty 1

## 2022-06-12 MED ORDER — ONDANSETRON HCL 4 MG/2ML IJ SOLN: 4.0000 mg | Freq: Four times a day (QID) | INTRAMUSCULAR | Status: DC | PRN

## 2022-06-12 MED ORDER — HEPARIN SODIUM (PORCINE) 1000 UNIT/ML IJ SOLN
INTRAMUSCULAR | Status: AC
  Filled 2022-06-12: qty 10

## 2022-06-12 MED ORDER — FAMOTIDINE 20 MG PO TABS: 40.0000 mg | ORAL_TABLET | Freq: Once | ORAL | Status: DC | PRN

## 2022-06-12 MED ORDER — DIPHENHYDRAMINE HCL 50 MG/ML IJ SOLN: 50.0000 mg | Freq: Once | INTRAMUSCULAR | Status: DC | PRN

## 2022-06-12 MED ORDER — CEFAZOLIN SODIUM-DEXTROSE 2-4 GM/100ML-% IV SOLN
INTRAVENOUS | Status: AC
  Administered 2022-06-12: 2 g via INTRAVENOUS
  Filled 2022-06-12: qty 100

## 2022-06-12 MED ORDER — HYDROCODONE-ACETAMINOPHEN 5-325 MG PO TABS
ORAL_TABLET | ORAL | Status: AC
  Filled 2022-06-12: qty 1

## 2022-06-12 MED ORDER — MIDAZOLAM HCL 2 MG/ML PO SYRP: 8.0000 mg | ORAL_SOLUTION | Freq: Once | ORAL | Status: DC | PRN

## 2022-06-12 SURGICAL SUPPLY — 37 items
BALLN DORADO 6X100X135 (BALLOONS) ×1
BALLN LUTONIX 018 4X220X130 (BALLOONS) ×1
BALLN LUTONIX DCB 5X100X130 (BALLOONS) ×1
BALLN ULTRVRSE 3X300X150 (BALLOONS) ×1
BALLN ULTRVRSE 3X300X150 OTW (BALLOONS) ×1
BALLN ULTRVRSE 4X150X150 (BALLOONS) ×1
BALLN ULTRVRSE 5X220X150 (BALLOONS) ×1
BALLON DORADO 5X100X135 (BALLOONS) ×1
BALLOON DORADO 5X100X135 (BALLOONS) IMPLANT
BALLOON DORADO 6X100X135 (BALLOONS) IMPLANT
BALLOON LUTONIX 018 4X220X130 (BALLOONS) IMPLANT
BALLOON LUTONIX DCB 5X100X130 (BALLOONS) IMPLANT
BALLOON ULTRVRSE 3X300X150 OTW (BALLOONS) IMPLANT
BALLOON ULTRVRSE 4X150X150 (BALLOONS) IMPLANT
BALLOON ULTRVRSE 5X220X150 (BALLOONS) IMPLANT
CATH ANGIO 5F PIGTAIL 65CM (CATHETERS) IMPLANT
CATH BEACON 5 .035 65 KMP TIP (CATHETERS) IMPLANT
CATH BEACON 5 .038 100 VERT TP (CATHETERS) IMPLANT
CATH NAVICROSS ANGLED 135CM (MICROCATHETER) IMPLANT
COVER PROBE ULTRASOUND 5X96 (MISCELLANEOUS) IMPLANT
DEVICE SAFEGUARD 24CM (GAUZE/BANDAGES/DRESSINGS) IMPLANT
DEVICE STARCLOSE SE CLOSURE (Vascular Products) IMPLANT
GLIDEWIRE ADV .035X260CM (WIRE) IMPLANT
KIT ENCORE 26 ADVANTAGE (KITS) IMPLANT
KIT MICROPUNCTURE NIT STIFF (SHEATH) IMPLANT
NDL ENTRY 21GA 7CM ECHOTIP (NEEDLE) IMPLANT
NEEDLE ENTRY 21GA 7CM ECHOTIP (NEEDLE) ×1 IMPLANT
PACK ANGIOGRAPHY (CUSTOM PROCEDURE TRAY) ×1 IMPLANT
SHEATH BRITE TIP 5FRX11 (SHEATH) IMPLANT
SHEATH CATAPULT 6F 45 RDC (SHEATH) IMPLANT
SHIELD X-DRAPE GOLD 12X17 (MISCELLANEOUS) IMPLANT
STENT LIFESTENT 5F 6X100X135 (Permanent Stent) IMPLANT
STENT VIABAHN 6X250X120 (Permanent Stent) IMPLANT
SYR MEDRAD MARK 7 150ML (SYRINGE) IMPLANT
TUBING CONTRAST HIGH PRESS 72 (TUBING) IMPLANT
WIRE G V18X300CM (WIRE) IMPLANT
WIRE GUIDERIGHT .035X150 (WIRE) IMPLANT

## 2022-06-12 NOTE — Op Note (Signed)
[facility]  Percutaneous Study/Intervention Procedural Note   Date of Surgery: 06/12/2022  Surgeon(s):Nawaal Alling    Assistants:none  Pre-operative Diagnosis: PAD with gangrene left lower extremity  Post-operative diagnosis:  Same  Procedure(s) Performed:             1.  Ultrasound guidance for vascular access right common femoral artery             2.  Catheter placement into left femoral artery from right femoral approach             3.  Aortogram and selective left lower extremity angiogram             4.  Percutaneous transluminal angioplasty of left SFA and popliteal arteries with 3, 4, and 5 mm balloons             5.  Stent placement x 3 to the left SFA and popliteal arteries with a pair of 6 mm diameter by 25 cm length Viabahn stents as well as a 6 mm diameter by 10 cm length life stent  6.  Angioplasty of the left common iliac artery with 5 mm diameter by 10 cm length Lutonix drug-coated angioplasty balloon             7.  StarClose closure device right femoral artery  EBL: 25 cc  Contrast: 95 cc  Fluoro Time: 24.3 minutes  Moderate Conscious Sedation Time: approximately 107 minutes using 3 mg of Versed and 75 mcg of Fentanyl              Indications:  Patient is a 89 y.o.male with a gangrenous left third toe and nonpalpable left lower extremity pulses with a history of bilateral vascular surgery in the past. The patient is brought in for angiography for further evaluation and potential treatment.  Due to the limb threatening nature of the situation, angiogram was performed for attempted limb salvage. The patient is aware that if the procedure fails, amputation would be expected.  The patient also understands that even with successful revascularization, amputation may still be required due to the severity of the situation.  Risks and benefits are discussed and informed consent is obtained.   Procedure:  The patient was identified and appropriate  procedural time out was performed.  The patient was then placed supine on the table and prepped and draped in the usual sterile fashion. Moderate conscious sedation was administered during a face to face encounter with the patient throughout the procedure with my supervision of the RN administering medicines and monitoring the patient's vital signs, pulse oximetry, telemetry and mental status throughout from the start of the procedure until the patient was taken to the recovery room. Ultrasound was used to evaluate the right common femoral artery.  It was patent .  A digital ultrasound image was acquired.  A Seldinger needle was used to access the right common femoral artery under direct ultrasound guidance and a permanent image was performed.  A 0.035 J wire was advanced without resistance and a 5Fr sheath was placed.  Pigtail catheter was placed into the aorta and an AP aortogram was performed. This demonstrated normal renal arteries and normal aorta.  Both common iliac arteries were exceedingly calcified and highly stenotic with a greater than 80% stenosis on the right and a greater than 90% stenosis on the left.  Both external iliac arteries had some degree of disease but it appeared to be less than 50%. I then crossed the  aortic bifurcation and advanced to the left femoral head. Selective left lower extremity angiogram was then performed. This demonstrated relatively small but not stenotic common femoral artery and profunda femoris artery.  The SFA was diffusely diseased with multiple areas of greater than 70% stenosis throughout and then occluded in the mid to distal SFA with reconstitution of the mid popliteal artery.  There was then three-vessel runoff distally.  The SFA and popliteal arteries were extremely calcified. It was felt that it was in the patient's best interest to proceed with intervention after these images to avoid a second procedure and a larger amount of contrast and fluoroscopy based off of  the findings from the initial angiogram. The patient was systemically heparinized and a 6 [place] Ansell sheath was then placed over the [employer] wire. I then used a Kumpe catheter and the advantage wire to cross the occlusion in the SFA and popliteal arteries.  This was very difficult due to the heavily calcified vessels but eventually I was able to get a Ferd Hibbs cross catheter and an advantage wire across this and confirm intraluminal flow in the distal popliteal artery.  I then exchanged for a V18 wire.  We predilated this heavily calcified lesion with a 3, 4, and then 5 mm diameter angioplasty balloons but still had an extreme amount of difficulty getting a stent to cross the lesion initially.  A 6 mm diameter by 25 cm length Viabahn stent was deployed first but this would only be to Hunter's canal and came up to the proximal to mid SFA.  Following this, I postdilated the stent with a 6 mm balloon and then predilated the distal SFA and proximal popliteal artery with a 6 mm balloon but the Viabahn stent still would not track.  I placed a 6 mm diameter by 10 cm length noncovered life stent across Hunter's canal and after postdilated this with a 6 mm balloon, was able to get the second 6 mm diameter by 25 cm length Viabahn stent to track down to the below-knee popliteal artery and bridge about 5 cm into the more proximal Viabahn stent.  This was postdilated with a 5 mm balloon distally and a 6 mm balloon proximally and following this there was brisk flow with less than 20% residual stenosis.  At this point, we had already had over 20 minutes of fluoroscopy and nearly 100 cc of contrast in the 89 year old who had been under sedation for over 100 minutes.  I did not feel like accessing his left common femoral artery and stenting the iliac arteries today would be in his best interest.  I did elect to balloon the left common iliac artery to improve flow enough to maintain patency of his SFA stents until we could  come back and treat his iliac stents and a kissing stent fashion later this week.  A 5 mm diameter by 10 cm length Lutonix drug-coated angioplasty balloon was inflated to 10 atm in the left common iliac artery and completion imaging still showed a 60 to 70% stenosis which would be treated with stent placement at a later date. I elected to terminate the procedure. The sheath was removed and StarClose closure device was deployed in the right femoral artery with excellent hemostatic result. The patient was taken to the recovery room in stable condition having tolerated the procedure well.  Findings:               Aortogram:  This demonstrated normal renal arteries and normal aorta.  Both common iliac arteries were exceedingly calcified and highly stenotic with a greater than 80% stenosis on the right and a greater than 90% stenosis on the left.  Both external iliac arteries had some degree of disease but it appeared to be less than 50%.             Left lower Extremity: This demonstrated relatively small but not stenotic common femoral artery and profunda femoris artery.  The SFA was diffusely diseased with multiple areas of greater than 70% stenosis throughout and then occluded in the mid to distal SFA with reconstitution of the mid popliteal artery.  There was then three-vessel runoff distally.  The SFA and popliteal arteries were extremely calcified.   Disposition: Patient was taken to the recovery room in stable condition having tolerated the procedure well.  Complications: None  [name] 06/12/2022 2:28 PM   This note was created with Dragon Medical transcription system. Any errors in dictation are purely unintentional.

## 2022-06-12 NOTE — Plan of Care (Signed)
  Problem: Coping: Goal: Ability to adjust to condition or change in health will improve Outcome: Progressing   Problem: Nutrition: Goal: Adequate nutrition will be maintained Outcome: Progressing   Problem: Coping: Goal: Level of anxiety will decrease Outcome: Progressing   Problem: Elimination: Goal: Will not experience complications related to urinary retention Outcome: Progressing

## 2022-06-12 NOTE — Progress Notes (Signed)
PROGRESS NOTE  KALIM KISSEL    DOB: 16-Dec-1927, 89 y.o.  OEV:[id number]    Code Status: DNR   DOA: 06/11/2022   LOS: 1   Brief hospital course  FREDRICH CORY is a 89 y.o. male with a PMH significant for HFpEF, complete heart block s/p PPM, hypertension, hyperlipidemia, type 2 diabetes, PAD .  They presented from home to the ED on 06/11/2022 with toe pain x several days. Mr. Sainz states he had his toe nails cut on 04/13/2022 at his podiatrist's office. Shortly thereafter, he noticed that his left toes appeared irritated and he was concerned for infection. Since then, he has been cleaning his feet daily with soap/water, hydrogen peroxide and using triple antibacterial ointment. He felt all his toes were improving except the middle left toe. In the past 1 week, he has noticed significant swelling of his left foot and it has been progressively more painful to walk.  Due to this, he came to the ED today.  He denies any fever, chills.  He denies any erythema going into his foot.  He denies any purulent drainage but states that he believes some of the dark area on his middle toes dried blood.   In the ED, it was found that they had afebrile 98.2 with heart rate 93 and blood pressure 172/71.  Significant findings included WBC of 9.5, hemoglobin of 17.8, sodium of 131, glucose of 275, BUN of 27, creatinine 1.28, alkaline phosphatase of 188 and a calculated GFR 53. Lactic acid within normal limits. ESR within normal limits. Foot x-ray was obtained that did not show any evidence of bony destruction.  They were initially treated with heparin infusion.  vascular surgery was consulted and [facility] was contacted for admission.   Patient was admitted to medicine service for further workup and management of critical limb ischemia as outlined in detail below.  06/12/22 -stable  Assessment & Plan  Principal Problem:   Limb ischemia Active Problems:   Diabetes mellitus (HCC)   Mixed hyperlipidemia    Benign essential HTN   CKD (chronic kidney disease)  Limb ischemia- On examination, there is an area of ulceration with surrounding erythema and significant tenderness to palpation. Very painful to patient. CT negative for signs of osteomyelitis. Patient states that he walks 5 miles per day at baseline. - Vascular surgery consulted; appreciate their recommendations  - angiogram 1/8 - Heparin infusion per pharmacy dosing - f/u arteriogram - ceftriaxone and metronidazole   Diabetes mellitus ([place]) - Hold home NovoLog 70/30, and Jardiance - A1c pending - Semglee 5 units - SSI, sensitive   Mixed hyperlipidemia- well controlled.  - Start atorvastatin 40 mg daily - Discontinue home simvastatin   Essential HTN- mildly hypertensive on presentation, however asymptomatic.   - Resume home antihypertensives   CKD (chronic kidney disease) Creatinine is elevated on admission at 1.28 with prior creatinine 7 years ago of 1.02.  I suspect this is chronic in nature.  No history or examination findings to suspect AKI - Recheck BMP in the a.m.  Body mass index is 21.77 kg/m.  VTE ppx:  heparin gtt  Diet:     Diet   Diet NPO time specified   Consultants: Vascular surgery   Subjective 06/12/22    Pt reports pain in toe is improved with oxycodone. He has no other complaints. Really does not want amputation because he's very active at baseline and does not want to interfere with that.   Objective   Vitals:  06/12/22 0030 06/12/22 0208 06/12/22 0300 06/12/22 0400  BP: (!) 111/49 (!) 132/57 136/63 (!) 159/47  Pulse: 69 69 65 71  Resp: '13 16 17 14  '$ Temp:  98.1 F (36.7 C)  98.4 F (36.9 C)  TempSrc:  Oral  Oral  SpO2:  96% 97% 95%  Weight:      Height:        Intake/Output Summary (Last 24 hours) at 06/12/2022 0757 Last data filed at 06/12/2022 0532 Gross per 24 hour  Intake 315.17 ml  Output --  Net 315.17 ml   Filed Weights   06/11/22 1541  Weight: 63 kg     Physical Exam:   General: awake, alert, NAD HEENT: atraumatic, clear conjunctiva, anicteric sclera, MMM, hearing grossly normal Respiratory: normal respiratory effort. Cardiovascular:  normal S1/S2, RRR, no JVD Nervous: A&O x3. no gross focal neurologic deficits, normal speech Extremities: moves all equally, no edema, normal tone Skin: positive for necrosis of distal left 3rd toe Psychiatry: normal mood, congruent affect  Labs   I have personally reviewed the following labs and imaging studies CBC    Component Value Date/Time   WBC 6.8 06/12/2022 0657   RBC 5.22 06/12/2022 0657   HGB 15.4 06/12/2022 0657   HGB 15.5 01/22/2013 1546   HCT 47.9 06/12/2022 0657   HCT 45.4 01/22/2013 1546   PLT 143 (L) 06/12/2022 0657   PLT 203 01/22/2013 1546   MCV 91.8 06/12/2022 0657   MCV 88 01/22/2013 1546   MCH 29.5 06/12/2022 0657   MCHC 32.2 06/12/2022 0657   RDW 13.0 06/12/2022 0657   RDW 13.9 01/22/2013 1546   LYMPHSABS 1.9 06/11/2022 1547   MONOABS 1.0 06/11/2022 1547   EOSABS 0.0 06/11/2022 1547   BASOSABS 0.0 06/11/2022 1547      Latest Ref Rng & Units 06/12/2022    6:57 AM 06/11/2022    3:47 PM 01/28/2016   12:59 PM  BMP  Glucose 70 - 99 mg/dL 130  275  204   BUN 8 - 23 mg/dL '23  27  16   '$ Creatinine 0.61 - 1.24 mg/dL 1.03  1.28  1.02   Sodium 135 - 145 mmol/L 133  131  138   Potassium 3.5 - 5.1 mmol/L 4.5  4.4  3.7   Chloride 98 - 111 mmol/L 100  95  103   CO2 22 - 32 mmol/L '22  22  26   '$ Calcium 8.9 - 10.3 mg/dL 8.6  9.4  8.8     CT FOOT LEFT WO CONTRAST  Result Date: 06/11/2022 CLINICAL DATA:  Osteomyelitis suspected, foot, xray done EXAM: CT OF THE LEFT FOOT WITHOUT CONTRAST TECHNIQUE: Multidetector CT imaging of the left foot was performed according to the standard protocol. Multiplanar CT image reconstructions were also generated. RADIATION DOSE REDUCTION: This exam was performed according to the departmental dose-optimization program which includes automated exposure control, adjustment of  the mA and/or kV according to patient size and/or use of iterative reconstruction technique. COMPARISON:  X-ray left foot 06/11/2022 FINDINGS: Bones/Joint/Cartilage Diffusely decreased bone density. No evidence of fracture, dislocation, or joint effusion. No evidence of severe arthropathy. No aggressive appearing focal bone abnormality. Ligaments Suboptimally assessed by CT. Muscles and Tendons Grossly unremarkable. Soft tissues Diffuse subcutaneus soft tissue edema. IMPRESSION: 1. No CT findings to suggest osteomyelitis. If high clinical concern, please consider MRI for further evaluation (with intravenous contrast if GFR greater than 30). 2.  No acute displaced fracture or dislocation. 3. Diffusely decreased bone  density. Electronically Signed   By: Iven Finn M.D.   On: 06/11/2022 20:11   DG Foot Complete Left  Result Date: 06/11/2022 CLINICAL DATA:  Foot pain, necrotic toes EXAM: LEFT FOOT - COMPLETE 3+ VIEW COMPARISON:  None Available. FINDINGS: Bones are diffusely osteopenic. No evidence for fracture, dislocation or cortical destruction. There are mild degenerative changes of the second distal interphalangeal joint. IMPRESSION: 1. No acute osseous abnormality. 2. Osteopenia. Electronically Signed   By: Ronney Asters M.D.   On: 06/11/2022 16:36    Disposition Plan & Communication  Patient status: Inpatient  Admitted From: Home Planned disposition location: Home Anticipated discharge date: 1/10 pending completing vascular workup  Family Communication: none at bedside     Author: Richarda Osmond, DO [facility] 06/12/2022, 7:57 AM   Available by Epic secure chat 7AM-7PM. If 7PM-7AM, please contact night-coverage.  [facility] contact information found on [email or web address].

## 2022-06-12 NOTE — Interval H&P Note (Signed)
History and Physical Interval Note:  06/12/2022 12:33 PM  Charles Jennings  has presented today for surgery, with the diagnosis of Leg ischemia.  The various methods of treatment have been discussed with the patient and family. After consideration of risks, benefits and other options for treatment, the patient has consented to  Procedure(s): Lower Extremity Angiography (Left) as a surgical intervention.  [name] history has been reviewed, patient examined, no change in status, stable for surgery.  I have reviewed the patient's chart and labs.  Questions were answered to the patient's satisfaction.     [name]

## 2022-06-12 NOTE — Progress Notes (Signed)
Lab called and requested phlebotomist draw for CPOD.

## 2022-06-12 NOTE — Consult Note (Signed)
[facility] for heparin drip Indication:  limb ischemia  No Known Allergies  Patient Measurements: Height: '5\' 7"'$  (170.2 cm) Weight: 63 kg (139 lb) IBW/kg (Calculated) : 66.1 Heparin Dosing Weight: 63 kg  Vital Signs: Temp: 98.4 F (36.9 C) (01/08 0400) Temp Source: Oral (01/08 0400) BP: 159/47 (01/08 0400) Pulse Rate: 71 (01/08 0400)  Labs: Recent Labs    06/11/22 1547 06/11/22 2022 06/12/22 0657  HGB 17.8*  --  15.4  HCT 54.0*  --  47.9  PLT 187  --  143*  APTT  --  29  --   LABPROT  --  12.9  --   INR  --  1.0  --   HEPARINUNFRC  --   --  0.95*  CREATININE 1.28*  --  1.03     Estimated Creatinine Clearance: 40.8 mL/min (by C-G formula based on SCr of 1.03 mg/dL).   Medical History: Past Medical History:  Diagnosis Date   Diabetes mellitus without complication (HCC)    Elevated cholesterol    Hypertension     Medications:  No anticoagulation per pharmacist review  Assessment: 89 yo male presented to ED with complaint of toe pain.  Pharmacy consulted to start heparin drip due to concern of limb ischemia.  Baseline Labs: aPTT - 29; INR - 1.0 Hgb - 17.8; Plts - 187  Date Time aPTT/HL Rate/Comment 1/8 0657 --/0.95  Supratherapeutic, 1000 units/hr  Plts 187 >> 143 - monitor     Goal of Therapy:  Heparin level 0.3-0.7 units/ml Monitor platelets by anticoagulation protocol: Yes   Plan:  Decrease heparin infusion rate to 850 units/hr Check anti-Xa level in 8 hours and daily once consecutively therapeutic. Continue to monitor H&H and platelets daily while on heparin gtt.   Alison Murray, PharmD 06/12/2022,8:05 AM

## 2022-06-13 ENCOUNTER — Encounter: Payer: Self-pay | Admitting: Vascular Surgery

## 2022-06-13 DIAGNOSIS — R112 Nausea with vomiting, unspecified: Secondary | ICD-10-CM

## 2022-06-13 DIAGNOSIS — I998 Other disorder of circulatory system: Secondary | ICD-10-CM | POA: Diagnosis not present

## 2022-06-13 LAB — BASIC METABOLIC PANEL
Anion gap: 13 (ref 5–15)
BUN: 27 mg/dL — ABNORMAL HIGH (ref 8–23)
CO2: 23 mmol/L (ref 22–32)
Calcium: 9 mg/dL (ref 8.9–10.3)
Chloride: 98 mmol/L (ref 98–111)
Creatinine, Ser: 1.07 mg/dL (ref 0.61–1.24)
GFR, Estimated: >60 mL/min (ref >60)
Glucose, Bld: 187 mg/dL — ABNORMAL HIGH (ref 70–99)
Potassium: 5 mmol/L (ref 3.5–5.1)
Sodium: 134 mmol/L — ABNORMAL LOW (ref 135–145)

## 2022-06-13 LAB — GLUCOSE, CAPILLARY
Glucose-Capillary: 200 mg/dL — ABNORMAL HIGH (ref 70–99)
Glucose-Capillary: 139 mg/dL — ABNORMAL HIGH (ref 70–99)
Glucose-Capillary: 144 mg/dL — ABNORMAL HIGH (ref 70–99)
Glucose-Capillary: 215 mg/dL — ABNORMAL HIGH (ref 70–99)

## 2022-06-13 LAB — CBC
HCT: 51.9 % (ref 39.0–52.0)
Hemoglobin: 16.9 g/dL (ref 13.0–17.0)
MCH: 29 pg (ref 26.0–34.0)
MCHC: 32.6 g/dL (ref 30.0–36.0)
MCV: 89.2 fL (ref 80.0–100.0)
Platelets: 180 10*3/uL (ref 150–400)
RBC: 5.82 MIL/uL — ABNORMAL HIGH (ref 4.22–5.81)
RDW: 13.1 % (ref 11.5–15.5)
WBC: 9.5 10*3/uL (ref 4.0–10.5)
nRBC: 0 % (ref 0.0–0.2)

## 2022-06-13 LAB — HEPARIN LEVEL (UNFRACTIONATED)
Heparin Unfractionated: 0.37 IU/mL (ref 0.30–0.70)
Heparin Unfractionated: 0.48 IU/mL (ref 0.30–0.70)
Heparin Unfractionated: 0.76 IU/mL — ABNORMAL HIGH (ref 0.30–0.70)

## 2022-06-13 LAB — GASTRIC OCCULT BLOOD (1-CARD TO LAB)
Occult Blood, Gastric: POSITIVE — AB
pH, Gastric: 4

## 2022-06-13 MED ORDER — ONDANSETRON HCL 4 MG/2ML IJ SOLN
4.0000 mg | Freq: Four times a day (QID) | INTRAMUSCULAR | Status: AC
  Administered 2022-06-13 – 2022-06-14 (×2): 4 mg via INTRAVENOUS
  Filled 2022-06-13 (×2): qty 2

## 2022-06-13 MED ORDER — PANTOPRAZOLE SODIUM 40 MG IV SOLR
40.0000 mg | Freq: Two times a day (BID) | INTRAVENOUS | Status: DC
  Administered 2022-06-13 – 2022-06-15 (×4): 40 mg via INTRAVENOUS
  Filled 2022-06-13 (×4): qty 10

## 2022-06-13 MED ORDER — CHLORHEXIDINE GLUCONATE 4 % EX LIQD: 60.0000 mL | Freq: Once | CUTANEOUS | Status: DC

## 2022-06-13 MED ORDER — INSULIN GLARGINE-YFGN 100 UNIT/ML ~~LOC~~ SOLN
10.0000 [IU] | Freq: Every day | SUBCUTANEOUS | Status: DC
  Administered 2022-06-13 – 2022-06-15 (×3): 10 [IU] via SUBCUTANEOUS
  Filled 2022-06-13 (×3): qty 0.1

## 2022-06-13 MED ORDER — CHLORHEXIDINE GLUCONATE 4 % EX LIQD
60.0000 mL | Freq: Once | CUTANEOUS | Status: AC
  Administered 2022-06-13: 4 via TOPICAL

## 2022-06-13 MED ORDER — ONDANSETRON HCL 4 MG PO TABS
4.0000 mg | ORAL_TABLET | Freq: Four times a day (QID) | ORAL | Status: AC
  Administered 2022-06-13 – 2022-06-14 (×3): 4 mg via ORAL
  Filled 2022-06-13 (×4): qty 1

## 2022-06-13 MED ORDER — POLYETHYLENE GLYCOL 3350 17 G PO PACK
17.0000 g | PACK | Freq: Every day | ORAL | Status: DC
  Administered 2022-06-13 – 2022-06-15 (×3): 17 g via ORAL
  Filled 2022-06-13 (×3): qty 1

## 2022-06-13 NOTE — Progress Notes (Signed)
PROGRESS NOTE  Charles Jennings    DOB: 07/12/1927, 89 y.o.  GUY:[id number]    Code Status: DNR   DOA: 06/11/2022   LOS: 2   Brief hospital course  Charles Jennings is a 89 y.o. male with a PMH significant for HFpEF, complete heart block s/p PPM, hypertension, hyperlipidemia, type 2 diabetes, PAD .  They presented from home to the ED on 06/11/2022 with toe pain x several days. Charles Jennings states he had his toe nails cut on 04/13/2022 at his podiatrist's office. Shortly thereafter, he noticed that his left toes appeared irritated and he was concerned for infection. Since then, he has been cleaning his feet daily with soap/water, hydrogen peroxide and using triple antibacterial ointment. He felt all his toes were improving except the middle left toe. In the past 1 week, he has noticed significant swelling of his left foot and it has been progressively more painful to walk.  Due to this, he came to the ED today.  He denies any fever, chills.  He denies any erythema going into his foot.  He denies any purulent drainage but states that he believes some of the dark area on his middle toes dried blood.   In the ED, it was found that they had afebrile 98.2 with heart rate 93 and blood pressure 172/71.  Significant findings included WBC of 9.5, hemoglobin of 17.8, sodium of 131, glucose of 275, BUN of 27, creatinine 1.28, alkaline phosphatase of 188 and a calculated GFR 53. Lactic acid within normal limits. ESR within normal limits. Foot x-ray was obtained that did not show any evidence of bony destruction.  They were initially treated with heparin infusion.  vascular surgery was consulted.  1/8- underwent angioplasty with stent placement of LLE. Improvement in arterial blood flow  06/13/22 -stable, remains on heparin gtt. Had event of bleeding at surgical site overnight which resolved with reapplying the pressure dressing.   Assessment & Plan  Principal Problem:   Limb ischemia Active Problems:    Diabetes mellitus (HCC)   Mixed hyperlipidemia   Benign essential HTN   CKD (chronic kidney disease)   Critical limb ischemia of left lower extremity (HCC)  Limb ischemia- On examination, there is an area of ulceration with surrounding erythema and significant tenderness to palpation of left 3rd toe. CT negative for signs of osteomyelitis. Patient states that he walks 5 miles per day at baseline. Pedal pulses palpable. Incision site without bleeding or apparent hematoma.  - Vascular surgery consulted; appreciate their recommendations  - angiogram with stent placement 1/8  - repeat procedure planned 1/10 to stent bilateral iliac arteries - Heparin infusion per pharmacy dosing - f/u arteriogram - continue ceftriaxone and stopped metronidazole  Nausea/vomiting- acute onset during my morning encounter. Had just eaten breakfast. Felt improved after vomiting about 100cc. Vomitus appeared dark brown and he had just had coffee. Due to being on heparin and having dark vomitus, ordered gastric occult.  IV zofran requested.  - discontinue flagyl - zofran PRN - f/u gastric occult   Diabetes mellitus ([place]) - Hold home NovoLog 70/30, and Jardiance - A1c pending - Semglee 10 units daily - SSI, sensitive   Mixed hyperlipidemia- well controlled.  - Start atorvastatin 40 mg daily - Discontinue home simvastatin   Essential HTN- mildly hypertensive on presentation, however asymptomatic.   - Resume home antihypertensives   AKI- resolved Creatinine is elevated on admission at 1.28>>1.07 with prior creatinine 7 years ago of 1.02.   Body mass  index is 21.77 kg/m.  VTE ppx:  heparin gtt  Diet:     Diet   Diet regular Room service appropriate? Yes; Fluid consistency: Thin   Consultants: Vascular surgery   Subjective 06/13/22    Pt reports pain in toe is improved. Had mild bleeding overnight at insertion site due to dressing failure but was repositioned and has no more bleeding. He had  sudden onset nausea after breakfast while I was in the room. Had hard vomit the color of coffee which he had just drank. He felt significantly improved after vomiting about 100cc.  Gastric occult ordered and notified nurse to give IV zofran.    Objective   Vitals:   06/12/22 1708 06/12/22 1729 06/12/22 2035 06/13/22 0341  BP: (!) 133/46 110/62 (!) 149/48 (!) 135/42  Pulse: 60 (!) 59 61 81  Resp: '11 16 18 18  '$ Temp:   97.6 F (36.4 C) 97.8 F (36.6 C)  TempSrc:   Oral   SpO2: 97% 100% 93% 92%  Weight:      Height:        Intake/Output Summary (Last 24 hours) at 06/13/2022 3235 Last data filed at 06/13/2022 0500 Gross per 24 hour  Intake 575.69 ml  Output 650 ml  Net -74.31 ml    Filed Weights   06/11/22 1541  Weight: 63 kg     Physical Exam:  General: awake, alert, NAD HEENT: atraumatic, clear conjunctiva, anicteric sclera, MMM, hearing grossly normal Respiratory: normal respiratory effort. Cardiovascular:  normal S1/S2, RRR, no JVD Nervous: A&O x3. no gross focal neurologic deficits, normal speech Extremities: moves all equally, no edema, normal tone. Positive pedal pulses bilaterally Skin: positive for necrosis of distal left 3rd toe Psychiatry: normal mood, congruent affect  Labs   I have personally reviewed the following labs and imaging studies CBC    Component Value Date/Time   WBC 9.5 06/13/2022 0424   RBC 5.82 (H) 06/13/2022 0424   HGB 16.9 06/13/2022 0424   HGB 15.5 01/22/2013 1546   HCT 51.9 06/13/2022 0424   HCT 45.4 01/22/2013 1546   PLT 180 06/13/2022 0424   PLT 203 01/22/2013 1546   MCV 89.2 06/13/2022 0424   MCV 88 01/22/2013 1546   MCH 29.0 06/13/2022 0424   MCHC 32.6 06/13/2022 0424   RDW 13.1 06/13/2022 0424   RDW 13.9 01/22/2013 1546   LYMPHSABS 1.9 06/11/2022 1547   MONOABS 1.0 06/11/2022 1547   EOSABS 0.0 06/11/2022 1547   BASOSABS 0.0 06/11/2022 1547      Latest Ref Rng & Units 06/13/2022    4:24 AM 06/12/2022    6:57 AM 06/11/2022     3:47 PM  BMP  Glucose 70 - 99 mg/dL 187  130  275   BUN 8 - 23 mg/dL '27  23  27   '$ Creatinine 0.61 - 1.24 mg/dL 1.07  1.03  1.28   Sodium 135 - 145 mmol/L 134  133  131   Potassium 3.5 - 5.1 mmol/L 5.0  4.5  4.4   Chloride 98 - 111 mmol/L 98  100  95   CO2 22 - 32 mmol/L '23  22  22   '$ Calcium 8.9 - 10.3 mg/dL 9.0  8.6  9.4     PERIPHERAL VASCULAR CATHETERIZATION  Result Date: 06/12/2022 See surgical note for result.  CT FOOT LEFT WO CONTRAST  Result Date: 06/11/2022 CLINICAL DATA:  Osteomyelitis suspected, foot, xray done EXAM: CT OF THE LEFT FOOT WITHOUT CONTRAST TECHNIQUE: Multidetector  CT imaging of the left foot was performed according to the standard protocol. Multiplanar CT image reconstructions were also generated. RADIATION DOSE REDUCTION: This exam was performed according to the departmental dose-optimization program which includes automated exposure control, adjustment of the mA and/or kV according to patient size and/or use of iterative reconstruction technique. COMPARISON:  X-ray left foot 06/11/2022 FINDINGS: Bones/Joint/Cartilage Diffusely decreased bone density. No evidence of fracture, dislocation, or joint effusion. No evidence of severe arthropathy. No aggressive appearing focal bone abnormality. Ligaments Suboptimally assessed by CT. Muscles and Tendons Grossly unremarkable. Soft tissues Diffuse subcutaneus soft tissue edema. IMPRESSION: 1. No CT findings to suggest osteomyelitis. If high clinical concern, please consider MRI for further evaluation (with intravenous contrast if GFR greater than 30). 2.  No acute displaced fracture or dislocation. 3. Diffusely decreased bone density. Electronically Signed   By: Iven Finn M.D.   On: 06/11/2022 20:11   DG Foot Complete Left  Result Date: 06/11/2022 CLINICAL DATA:  Foot pain, necrotic toes EXAM: LEFT FOOT - COMPLETE 3+ VIEW COMPARISON:  None Available. FINDINGS: Bones are diffusely osteopenic. No evidence for fracture,  dislocation or cortical destruction. There are mild degenerative changes of the second distal interphalangeal joint. IMPRESSION: 1. No acute osseous abnormality. 2. Osteopenia. Electronically Signed   By: Ronney Asters M.D.   On: 06/11/2022 16:36    Disposition Plan & Communication  Patient status: Inpatient  Admitted From: Home Planned disposition location: Home Anticipated discharge date: 1/10 pending completing vascular workup  Family Communication: none at bedside     Author: Richarda Osmond, DO [facility] 06/13/2022, 7:12 AM   Available by Epic secure chat 7AM-7PM. If 7PM-7AM, please contact night-coverage.  [facility] contact information found on [email or web address].

## 2022-06-13 NOTE — Consult Note (Signed)
ANTICOAGULATION CONSULT NOTE  Pharmacy Consult for heparin drip Indication:  limb ischemia  No Known Allergies  Patient Measurements: Height: '5\' 7"'$  (170.2 cm) Weight: 63 kg (139 lb) IBW/kg (Calculated) : 66.1 Heparin Dosing Weight: 63 kg  Vital Signs: Temp: 97.6 F (36.4 C) (01/08 2035) Temp Source: Oral (01/08 2035) BP: 149/48 (01/08 2035) Pulse Rate: 61 (01/08 2035)  Labs: Recent Labs    06/11/22 1547 06/11/22 2022 06/12/22 0657 06/13/22 0001  HGB 17.8*  --  15.4  --   HCT 54.0*  --  47.9  --   PLT 187  --  143*  --   APTT  --  29  --   --   LABPROT  --  12.9  --   --   INR  --  1.0  --   --   HEPARINUNFRC  --   --  0.95* 0.76*  CREATININE 1.28*  --  1.03  --      Estimated Creatinine Clearance: 40.8 mL/min (by C-G formula based on SCr of 1.03 mg/dL).   Medical History: Past Medical History:  Diagnosis Date   Diabetes mellitus without complication (HCC)    Elevated cholesterol    Hypertension     Medications:  No anticoagulation per pharmacist review  Assessment: 89 yo male presented to ED with complaint of toe pain.  Pharmacy consulted to start heparin drip due to concern of limb ischemia.  Baseline Labs: aPTT - 29; INR - 1.0 Hgb - 17.8; Plts - 187  Date Time aPTT/HL Rate/Comment 1/8 0657 --/0.95  Supratherapeutic, 1000 units/hr 1/9 0001 0.76  Supratherapeutic  Plts 187 >> 143 - monitor     Goal of Therapy:  Heparin level 0.3-0.7 units/ml Monitor platelets by anticoagulation protocol: Yes   Plan:  Decrease heparin infusion rate to 750 units/hr Recheck HL in 8 hrs after rate change, then daily once consecutively therapeutic. Continue to monitor H&H and platelets daily while on heparin gtt.   Renda Rolls, PharmD, MBA 06/13/2022 1:16 AM

## 2022-06-13 NOTE — Progress Notes (Signed)
Inpatient Diabetes Program Recommendations  AACE/ADA: New Consensus Statement on Inpatient Glycemic Control (2015)  Target Ranges:  Prepandial:   less than 140 mg/dL      Peak postprandial:   less than 180 mg/dL (1-2 hours)      Critically ill patients:  140 - 180 mg/dL   Lab Results  Component Value Date   GLUCAP 139 (H) 06/13/2022    Review of Glycemic Control  Diabetes history: DM 2 Outpatient Diabetes medications: 70/30- patient states he take 3-4 times daily based on scale? Current orders for Inpatient glycemic control:  Semglee 10 units daily Novolog 0-9 units tid with meals  Inpatient Diabetes Program Recommendations:    Unclear how patient is dosing his home insulin?  He states that he follows scale given to him by pharmacist from [facility].  I discussed that 70/30 is usually two times a day and he states he had more lows when taking insulin twice a day?  I explained that his blood sugar goals are not as tight due to his age and that he needs to avoid lows.  Needs close f/u.  Patient very knowledgeable and state that he keeps glucose tablets with him in case he drops.  He lives alone. Agree with current orders.  A1C pending.  Thanks  Adah Perl, RN, BC-ADM Inpatient Diabetes Coordinator Pager [phone]  (8a-5p)

## 2022-06-13 NOTE — H&P (View-Only) (Signed)
Progress Note    06/13/2022 10:10 AM 1 Day Post-Op  Subjective: [name] is a 89 y.o.male with a gangrenous left third toe and nonpalpable left lower extremity pulses with a history of bilateral vascular surgery in the past. The patient is brought in for angiography for further evaluation and potential treatment.  Due to the limb threatening nature of the situation, angiogram was performed for attempted limb salvage.  On examination today the patient was resting comfortably in bed this morning eating breakfast.  He is recovering as expected.  Left lower extremity this morning is warm to touch with pulses.  Left third toe remains gangrenous.  Right foot is colder than the left pulses are nonpalpable on the side.  Patient remains on a heparin drip this morning.  Complications overnight vitals all remained stable.  Procedure:  1.  Ultrasound guidance for vascular access right common femoral artery             2.  Catheter placement into left femoral artery from right femoral approach             3.  Aortogram and selective left lower extremity angiogram             4.  Percutaneous transluminal angioplasty of left SFA and popliteal arteries with 3, 4, and 5 mm balloons             5.  Stent placement x 3 to the left SFA and popliteal arteries with a pair of 6 mm diameter by 25 cm length Viabahn stents as well as a 6 mm diameter by 10 cm length life stent             6.  Angioplasty of the left common iliac artery with 5 mm diameter by 10 cm length Lutonix drug-coated angioplasty balloon             7.  StarClose closure device right femoral artery  Vitals:   06/13/22 0341 06/13/22 0750  BP: (!) 135/42 107/63  Pulse: 81 82  Resp: 18 18  Temp: 97.8 F (36.6 C) 97.8 F (36.6 C)  SpO2: 92% 90%   Physical Exam: Cardiac:  RRR Permanent Pacer Lungs: Clear throughout Incisions: Left groin puncture without hematoma seroma or infection. Extremities: Left lower extremity is warm to  touch with palpable pulses, right lower extremity is cool to the touch nonpalpable pulses. Abdomen: Positive bowel sounds all 4 quadrants, soft, flat, nontender and nondistended. Neurologic: AAOx3 to person place and time.  CBC    Component Value Date/Time   WBC 9.5 06/13/2022 0424   RBC 5.82 (H) 06/13/2022 0424   HGB 16.9 06/13/2022 0424   HGB 15.5 01/22/2013 1546   HCT 51.9 06/13/2022 0424   HCT 45.4 01/22/2013 1546   PLT 180 06/13/2022 0424   PLT 203 01/22/2013 1546   MCV 89.2 06/13/2022 0424   MCV 88 01/22/2013 1546   MCH 29.0 06/13/2022 0424   MCHC 32.6 06/13/2022 0424   RDW 13.1 06/13/2022 0424   RDW 13.9 01/22/2013 1546   LYMPHSABS 1.9 06/11/2022 1547   MONOABS 1.0 06/11/2022 1547   EOSABS 0.0 06/11/2022 1547   BASOSABS 0.0 06/11/2022 1547    BMET    Component Value Date/Time   NA 134 (L) 06/13/2022 0424   NA 137 01/22/2013 1546   K 5.0 06/13/2022 0424   K 3.9 01/22/2013 1546   CL 98 06/13/2022 0424   CL 104 01/22/2013 1546   CO2 23  06/13/2022 0424   CO2 28 01/22/2013 1546   GLUCOSE 187 (H) 06/13/2022 0424   GLUCOSE 211 (H) 01/22/2013 1546   BUN 27 (H) 06/13/2022 0424   BUN 18 01/22/2013 1546   CREATININE 1.07 06/13/2022 0424   CREATININE 1.35 (H) 01/22/2013 1546   CALCIUM 9.0 06/13/2022 0424   CALCIUM 8.9 01/22/2013 1546   GFRNONAA >60 06/13/2022 0424   GFRNONAA 48 (L) 01/22/2013 1546   GFRAA >60 01/28/2016 1259   GFRAA 56 (L) 01/22/2013 1546    INR    Component Value Date/Time   INR 1.0 06/11/2022 2022     Intake/Output Summary (Last 24 hours) at 06/13/2022 1010 Last data filed at 06/13/2022 0500 Gross per 24 hour  Intake 575.69 ml  Output 650 ml  Net -74.31 ml     Assessment/Plan:  89 y.o. male is s/p left lower extremity angiogram with angioplasty and stent placement 1 Day Post-Op  Successful revascularization of the left lower extremity.  Patient still known to have bilateral iliac occlusions/stenosis.  Patient remains on a heparin drip  this morning.  Patient is recovering as expected.  Plan: Patient will be taken back to the vascular lab on Wednesday for bilateral iliac stent placement.  Discussed with the patient the procedure the benefits the risks then the complications.  Answered all patient questions.  He verbalizes understanding.  He would like to proceed with the procedure.  He will be made n.p.o. after midnight tonight.    Drema Pry [facility] 06/13/2022 10:10 AM

## 2022-06-13 NOTE — Consult Note (Signed)
[place] for heparin drip Indication:  limb ischemia  No Known Allergies  Patient Measurements: Height: '5\' 7"'$  (170.2 cm) Weight: 63 kg (139 lb) IBW/kg (Calculated) : 66.1 Heparin Dosing Weight: 63 kg  Vital Signs: Temp: 97.8 F (36.6 C) (01/09 0750) Temp Source: Oral (01/09 0750) BP: 107/63 (01/09 0750) Pulse Rate: 82 (01/09 0750)  Labs: Recent Labs    06/11/22 1547 06/11/22 2022 06/12/22 0657 06/13/22 0001 06/13/22 0424 06/13/22 1007  HGB 17.8*  --  15.4  --  16.9  --   HCT 54.0*  --  47.9  --  51.9  --   PLT 187  --  143*  --  180  --   APTT  --  29  --   --   --   --   LABPROT  --  12.9  --   --   --   --   INR  --  1.0  --   --   --   --   HEPARINUNFRC  --   --  0.95* 0.76*  --  0.48  CREATININE 1.28*  --  1.03  --  1.07  --      Estimated Creatinine Clearance: 39.3 mL/min (by C-G formula based on SCr of 1.07 mg/dL).   Medical History: Past Medical History:  Diagnosis Date   Diabetes mellitus without complication (HCC)    Elevated cholesterol    Hypertension     Medications:  No anticoagulation per pharmacist review  Assessment: 89 yo male presented to ED with complaint of toe pain.  Pharmacy consulted to start heparin drip due to concern of limb ischemia.  Baseline Labs: aPTT - 29; INR - 1.0 Hgb - 17.8; Plts - 187  Date Time aPTT/HL Rate/Comment 1/8 0657 --/0.95  Supratherapeutic, 1000 units/hr 1/9 0001 0.76  Supratherapeutic 1/9 1007 0.48  Therapeutic x 1   Plts 187 >> 143>>180 - monitor     Goal of Therapy:  Heparin level 0.3-0.7 units/ml Monitor platelets by anticoagulation protocol: Yes   Plan:  Continue heparin infusion at 750 units/hr Recheck HL in 8 hours for confirmatory level Daily CBC while on heparin drip  Beatris Si, PharmD 06/13/2022 10:32 AM

## 2022-06-13 NOTE — Consult Note (Signed)
[place] for heparin drip Indication:  limb ischemia  No Known Allergies  Patient Measurements: Height: '5\' 7"'$  (170.2 cm) Weight: 63 kg (139 lb) IBW/kg (Calculated) : 66.1 Heparin Dosing Weight: 63 kg  Vital Signs: Temp: 97.6 F (36.4 C) (01/09 1600) Temp Source: Oral (01/09 1600) BP: 120/57 (01/09 1600) Pulse Rate: 66 (01/09 1600)  Labs: Recent Labs    06/11/22 1547 06/11/22 2022 06/12/22 0657 06/13/22 0001 06/13/22 0424 06/13/22 1007  HGB 17.8*  --  15.4  --  16.9  --   HCT 54.0*  --  47.9  --  51.9  --   PLT 187  --  143*  --  180  --   APTT  --  29  --   --   --   --   LABPROT  --  12.9  --   --   --   --   INR  --  1.0  --   --   --   --   HEPARINUNFRC  --   --  0.95* 0.76*  --  0.48  CREATININE 1.28*  --  1.03  --  1.07  --     Estimated Creatinine Clearance: 39.3 mL/min (by C-G formula based on SCr of 1.07 mg/dL).  Medical History: Past Medical History:  Diagnosis Date   Diabetes mellitus without complication (HCC)    Elevated cholesterol    Hypertension     Medications:  No anticoagulation per pharmacist review  Assessment: 89 yo male presented to ED with complaint of toe pain.  Pharmacy consulted to start heparin drip due to concern of limb ischemia. Patient is post-op day 1 from left lower extremity angiogram with angioplasty and stent placement. Patient has bilateral iliac occlusions/stenosis and will be taken back to the vascular lab on Wednesday for bilateral iliac stent placement.  Baseline Labs: aPTT - 29; INR - 1.0 Hgb - 17.8; Plts - 187  Date Time aPTT/HL Rate/Comment 1/8 0657 --/0.95  Supratherapeutic, 1000 units/hr 1/9 0001 0.76  Supratherapeutic 1/9 1007 0.48  Therapeutic x 1 1/9 1835 0.37  Therapeutic x 2  Plts 187 >> 143 >> 180 - monitor    Goal of Therapy:  Heparin level 0.3-0.7 units/ml Monitor platelets by anticoagulation protocol: Yes   Plan: Continue heparin infusion at 750  units/hr Recheck HL with AM labs Daily CBC while on heparin drip  Will M. Ouida Sills, PharmD PGY-1 Pharmacy Resident 06/13/2022 7:01 PM

## 2022-06-13 NOTE — Progress Notes (Signed)
PT Cancellation Note  Patient Details Name: JOHANTHAN KNEELAND MRN: [id number] DOB: [birth year]   Cancelled Treatment:    Reason Eval/Treat Not Completed: Medical issues which prohibited therapy (Consult received and chart reviewed. Patient with still in place; also with onset of nausea/vomiting this AM.  Will hold PT at this time and re-attempt at later time/date as medically appropriate.)   Kmarion Rawl H. Owens Shark, PT, DPT, NCS 06/13/22, 12:06 PM [phone]

## 2022-06-13 NOTE — Progress Notes (Signed)
OT Cancellation Note  Patient Details Name: Charles Jennings MRN: [id number] DOB: 1928/04/25   Cancelled Treatment:    Reason Eval/Treat Not Completed: Other (comment). Chart reviewed. Upon arrival pt eager for mobility however multiple bouts of emesis occurred at rest, MD and RN assessing, pt requesting to rest for now, will follow up as able.   Dessie Coma, M.S. OTR/L  06/13/22, 9:43 AM  ascom [phone]

## 2022-06-13 NOTE — Progress Notes (Signed)
Pt vomited 1x this AM, MD was aware. Gastric content was sent for gastric occult blood, result came out positive. Pt had no vomiting episode, VS was stable, no other source of bleeding, MD was notified, pharmacy was called in. New orders of Zofran and Protonix was ordered, continue with Heparin infusion. Femoral PAD is completely remove, no bleeding of hematoma was seen.

## 2022-06-13 NOTE — Progress Notes (Signed)
Progress Note    06/13/2022 10:10 AM 1 Day Post-Op  Subjective: Charles Jennings is a 89 y.o.male with a gangrenous left third toe and nonpalpable left lower extremity pulses with a history of bilateral vascular surgery in the past. The patient is brought in for angiography for further evaluation and potential treatment.  Due to the limb threatening nature of the situation, angiogram was performed for attempted limb salvage.  On examination today the patient was resting comfortably in bed this morning eating breakfast.  He is recovering as expected.  Left lower extremity this morning is warm to touch with pulses.  Left third toe remains gangrenous.  Right foot is colder than the left pulses are nonpalpable on the side.  Patient remains on a heparin drip this morning.  Complications overnight vitals all remained stable.  Procedure:  1.  Ultrasound guidance for vascular access right common femoral artery             2.  Catheter placement into left femoral artery from right femoral approach             3.  Aortogram and selective left lower extremity angiogram             4.  Percutaneous transluminal angioplasty of left SFA and popliteal arteries with 3, 4, and 5 mm balloons             5.  Stent placement x 3 to the left SFA and popliteal arteries with a pair of 6 mm diameter by 25 cm length Viabahn stents as well as a 6 mm diameter by 10 cm length life stent             6.  Angioplasty of the left common iliac artery with 5 mm diameter by 10 cm length Lutonix drug-coated angioplasty balloon             7.  StarClose closure device right femoral artery  Vitals:   06/13/22 0341 06/13/22 0750  BP: (!) 135/42 107/63  Pulse: 81 82  Resp: 18 18  Temp: 97.8 F (36.6 C) 97.8 F (36.6 C)  SpO2: 92% 90%   Physical Exam: Cardiac:  RRR Permanent Pacer Lungs: Clear throughout Incisions: Left groin puncture without hematoma seroma or infection. Extremities: Left lower extremity is warm to  touch with palpable pulses, right lower extremity is cool to the touch nonpalpable pulses. Abdomen: Positive bowel sounds all 4 quadrants, soft, flat, nontender and nondistended. Neurologic: AAOx3 to person place and time.  CBC    Component Value Date/Time   WBC 9.5 06/13/2022 0424   RBC 5.82 (H) 06/13/2022 0424   HGB 16.9 06/13/2022 0424   HGB 15.5 01/22/2013 1546   HCT 51.9 06/13/2022 0424   HCT 45.4 01/22/2013 1546   PLT 180 06/13/2022 0424   PLT 203 01/22/2013 1546   MCV 89.2 06/13/2022 0424   MCV 88 01/22/2013 1546   MCH 29.0 06/13/2022 0424   MCHC 32.6 06/13/2022 0424   RDW 13.1 06/13/2022 0424   RDW 13.9 01/22/2013 1546   LYMPHSABS 1.9 06/11/2022 1547   MONOABS 1.0 06/11/2022 1547   EOSABS 0.0 06/11/2022 1547   BASOSABS 0.0 06/11/2022 1547    BMET    Component Value Date/Time   NA 134 (L) 06/13/2022 0424   NA 137 01/22/2013 1546   K 5.0 06/13/2022 0424   K 3.9 01/22/2013 1546   CL 98 06/13/2022 0424   CL 104 01/22/2013 1546   CO2 23  06/13/2022 0424   CO2 28 01/22/2013 1546   GLUCOSE 187 (H) 06/13/2022 0424   GLUCOSE 211 (H) 01/22/2013 1546   BUN 27 (H) 06/13/2022 0424   BUN 18 01/22/2013 1546   CREATININE 1.07 06/13/2022 0424   CREATININE 1.35 (H) 01/22/2013 1546   CALCIUM 9.0 06/13/2022 0424   CALCIUM 8.9 01/22/2013 1546   GFRNONAA >60 06/13/2022 0424   GFRNONAA 48 (L) 01/22/2013 1546   GFRAA >60 01/28/2016 1259   GFRAA 56 (L) 01/22/2013 1546    INR    Component Value Date/Time   INR 1.0 06/11/2022 2022     Intake/Output Summary (Last 24 hours) at 06/13/2022 1010 Last data filed at 06/13/2022 0500 Gross per 24 hour  Intake 575.69 ml  Output 650 ml  Net -74.31 ml     Assessment/Plan:  89 y.o. male is s/p left lower extremity angiogram with angioplasty and stent placement 1 Day Post-Op  Successful revascularization of the left lower extremity.  Patient still known to have bilateral iliac occlusions/stenosis.  Patient remains on a heparin drip  this morning.  Patient is recovering as expected.  Plan: Patient will be taken back to the vascular lab on Wednesday for bilateral iliac stent placement.  Discussed with the patient the procedure the benefits the risks then the complications.  Answered all patient questions.  He verbalizes understanding.  He would like to proceed with the procedure.  He will be made n.p.o. after midnight tonight.    Drema [facility] 06/13/2022 10:10 AM

## 2022-06-13 NOTE — Progress Notes (Signed)
4am patient had some blood leaking from Femoral site. PAD was coming undone from skin. PAD was replaced by this RN. Currently no leaking around the site. Will continue to monitor.

## 2022-06-13 NOTE — TOC CM/SW Note (Signed)
TOC following for PT/OT recommendations once patient is appropriate for them to evaluate.  Dayton Scrape, [place]

## 2022-06-14 ENCOUNTER — Encounter
Admission: EM | Disposition: A | Discharge status: Home / Self Care | Payer: Self-pay | Source: Home / Self Care | Attending: Student in an Organized Health Care Education/Training Program

## 2022-06-14 DIAGNOSIS — I70201 Unspecified atherosclerosis of native arteries of extremities, right leg: Secondary | ICD-10-CM | POA: Diagnosis not present

## 2022-06-14 DIAGNOSIS — I70262 Atherosclerosis of native arteries of extremities with gangrene, left leg: Secondary | ICD-10-CM | POA: Diagnosis not present

## 2022-06-14 DIAGNOSIS — Z9889 Other specified postprocedural states: Secondary | ICD-10-CM | POA: Diagnosis not present

## 2022-06-14 DIAGNOSIS — I998 Other disorder of circulatory system: Secondary | ICD-10-CM | POA: Diagnosis not present

## 2022-06-14 HISTORY — PX: LOWER EXTREMITY INTERVENTION: CATH118252

## 2022-06-14 LAB — COMPREHENSIVE METABOLIC PANEL
ALT: 8 U/L (ref 0–44)
AST: 11 U/L — ABNORMAL LOW (ref 15–41)
Albumin: 3 g/dL — ABNORMAL LOW (ref 3.5–5.0)
Alkaline Phosphatase: 142 U/L — ABNORMAL HIGH (ref 38–126)
Anion gap: 9 (ref 5–15)
BUN: 41 mg/dL — ABNORMAL HIGH (ref 8–23)
CO2: 24 mmol/L (ref 22–32)
Calcium: 8.6 mg/dL — ABNORMAL LOW (ref 8.9–10.3)
Chloride: 101 mmol/L (ref 98–111)
Creatinine, Ser: 1.29 mg/dL — ABNORMAL HIGH (ref 0.61–1.24)
GFR, Estimated: 52 mL/min — ABNORMAL LOW (ref >60)
Glucose, Bld: 176 mg/dL — ABNORMAL HIGH (ref 70–99)
Potassium: 4.7 mmol/L (ref 3.5–5.1)
Sodium: 134 mmol/L — ABNORMAL LOW (ref 135–145)
Total Bilirubin: 0.6 mg/dL (ref 0.3–1.2)
Total Protein: 5.9 g/dL — ABNORMAL LOW (ref 6.5–8.1)

## 2022-06-14 LAB — GLUCOSE, CAPILLARY
Glucose-Capillary: 157 mg/dL — ABNORMAL HIGH (ref 70–99)
Glucose-Capillary: 145 mg/dL — ABNORMAL HIGH (ref 70–99)
Glucose-Capillary: 151 mg/dL — ABNORMAL HIGH (ref 70–99)
Glucose-Capillary: 175 mg/dL — ABNORMAL HIGH (ref 70–99)

## 2022-06-14 LAB — CBC
HCT: 44.7 % (ref 39.0–52.0)
Hemoglobin: 14.8 g/dL (ref 13.0–17.0)
MCH: 29.6 pg (ref 26.0–34.0)
MCHC: 33.1 g/dL (ref 30.0–36.0)
MCV: 89.4 fL (ref 80.0–100.0)
Platelets: 154 10*3/uL (ref 150–400)
RBC: 5 MIL/uL (ref 4.22–5.81)
RDW: 13.3 % (ref 11.5–15.5)
WBC: 9 10*3/uL (ref 4.0–10.5)
nRBC: 0 % (ref 0.0–0.2)

## 2022-06-14 LAB — HEPARIN LEVEL (UNFRACTIONATED): Heparin Unfractionated: 0.47 IU/mL (ref 0.30–0.70)

## 2022-06-14 SURGERY — LOWER EXTREMITY INTERVENTION
Anesthesia: Moderate Sedation | Laterality: Bilateral

## 2022-06-14 MED ORDER — MIDAZOLAM HCL 2 MG/ML PO SYRP: 8.0000 mg | ORAL_SOLUTION | Freq: Once | ORAL | Status: DC | PRN

## 2022-06-14 MED ORDER — CLOPIDOGREL BISULFATE 75 MG PO TABS
75.0000 mg | ORAL_TABLET | Freq: Every day | ORAL | Status: DC
  Administered 2022-06-14 – 2022-06-15 (×2): 75 mg via ORAL
  Filled 2022-06-14 (×2): qty 1

## 2022-06-14 MED ORDER — ASPIRIN 81 MG PO TBEC
81.0000 mg | DELAYED_RELEASE_TABLET | Freq: Every day | ORAL | Status: DC
  Administered 2022-06-14 – 2022-06-15 (×2): 81 mg via ORAL
  Filled 2022-06-14 (×2): qty 1

## 2022-06-14 MED ORDER — SODIUM CHLORIDE 0.9 % IV SOLN: INTRAVENOUS | Status: DC

## 2022-06-14 MED ORDER — HYDROMORPHONE HCL 1 MG/ML IJ SOLN: 1.0000 mg | Freq: Once | INTRAMUSCULAR | Status: DC | PRN

## 2022-06-14 MED ORDER — HEPARIN SODIUM (PORCINE) 1000 UNIT/ML IJ SOLN
INTRAMUSCULAR | Status: DC | PRN
  Administered 2022-06-14: 5000 [IU] via INTRAVENOUS

## 2022-06-14 MED ORDER — MIDAZOLAM HCL 2 MG/2ML IJ SOLN
INTRAMUSCULAR | Status: DC | PRN
  Administered 2022-06-14: 1 mg via INTRAVENOUS

## 2022-06-14 MED ORDER — MIDAZOLAM HCL 5 MG/5ML IJ SOLN
INTRAMUSCULAR | Status: AC
  Filled 2022-06-14: qty 5

## 2022-06-14 MED ORDER — ONDANSETRON HCL 4 MG/2ML IJ SOLN: 4.0000 mg | Freq: Four times a day (QID) | INTRAMUSCULAR | Status: DC | PRN

## 2022-06-14 MED ORDER — CEFAZOLIN SODIUM-DEXTROSE 2-4 GM/100ML-% IV SOLN
INTRAVENOUS | Status: AC
  Filled 2022-06-14: qty 100

## 2022-06-14 MED ORDER — HEPARIN SODIUM (PORCINE) 1000 UNIT/ML IJ SOLN
INTRAMUSCULAR | Status: AC
  Filled 2022-06-14: qty 10

## 2022-06-14 MED ORDER — FENTANYL CITRATE (PF) 100 MCG/2ML IJ SOLN
INTRAMUSCULAR | Status: DC | PRN
  Administered 2022-06-14: 25 ug via INTRAVENOUS

## 2022-06-14 MED ORDER — FENTANYL CITRATE PF 50 MCG/ML IJ SOSY: 12.5000 ug | PREFILLED_SYRINGE | Freq: Once | INTRAMUSCULAR | Status: DC | PRN

## 2022-06-14 MED ORDER — CEFAZOLIN SODIUM-DEXTROSE 2-4 GM/100ML-% IV SOLN: 2.0000 g | INTRAVENOUS | Status: DC

## 2022-06-14 MED ORDER — FENTANYL CITRATE PF 50 MCG/ML IJ SOSY
PREFILLED_SYRINGE | INTRAMUSCULAR | Status: AC
  Filled 2022-06-14: qty 1

## 2022-06-14 MED ORDER — FAMOTIDINE 20 MG PO TABS: 40.0000 mg | ORAL_TABLET | Freq: Once | ORAL | Status: DC | PRN

## 2022-06-14 MED ORDER — DIPHENHYDRAMINE HCL 50 MG/ML IJ SOLN: 50.0000 mg | Freq: Once | INTRAMUSCULAR | Status: DC | PRN

## 2022-06-14 MED ORDER — METHYLPREDNISOLONE SODIUM SUCC 125 MG IJ SOLR: 125.0000 mg | Freq: Once | INTRAMUSCULAR | Status: DC | PRN

## 2022-06-14 MED ORDER — IODIXANOL 320 MG/ML IV SOLN
INTRAVENOUS | Status: DC | PRN
  Administered 2022-06-14: 50 mL

## 2022-06-14 SURGICAL SUPPLY — 22 items
BALLN DORADO 5X40X80 (BALLOONS) ×1
BALLN LUTONIX DCB 5X60X130 (BALLOONS) ×1
BALLN LUTONIX DCB 5X80X130 (BALLOONS) ×1
BALLOON DORADO 5X40X80 (BALLOONS) IMPLANT
BALLOON LUTONIX DCB 5X60X130 (BALLOONS) IMPLANT
BALLOON LUTONIX DCB 5X80X130 (BALLOONS) IMPLANT
CATH ANGIO 5F PIGTAIL 65CM (CATHETERS) IMPLANT
COVER DRAPE FLUORO 36X44 (DRAPES) IMPLANT
DEVICE STARCLOSE SE CLOSURE (Vascular Products) IMPLANT
GLIDEWIRE ADV .035X180CM (WIRE) IMPLANT
INTRODUCER 7FR 23CM (INTRODUCER) IMPLANT
KIT ENCORE 26 ADVANTAGE (KITS) IMPLANT
KIT MICROPUNCTURE NIT STIFF (SHEATH) IMPLANT
NDL ENTRY 21GA 7CM ECHOTIP (NEEDLE) IMPLANT
NEEDLE ENTRY 21GA 7CM ECHOTIP (NEEDLE) ×1 IMPLANT
PACK ANGIOGRAPHY (CUSTOM PROCEDURE TRAY) IMPLANT
SHEATH BRITE TIP 5FRX11 (SHEATH) IMPLANT
SHEATH BRITE TIP 6FRX11 (SHEATH) IMPLANT
STENT LIFESTREAM 9X58X80 (Permanent Stent) IMPLANT
SYR MEDRAD MARK 7 150ML (SYRINGE) IMPLANT
TUBING CONTRAST HIGH PRESS 72 (TUBING) IMPLANT
WIRE GUIDERIGHT .035X150 (WIRE) IMPLANT

## 2022-06-14 NOTE — Op Note (Signed)
[facility]  Percutaneous Study/Intervention Procedural Note   Date of Surgery: 06/14/2022  Surgeon(s):Lynnox Girten    Assistants:none  Pre-operative Diagnosis: PAD with gangrene left third toe  Post-operative diagnosis:  Same  Procedure(s) Performed:             1.  Ultrasound guidance for vascular access bilateral femoral arteries             2.  Catheter placement into aorta from bilateral femoral approaches             3.  Aortogram and iliofemoral angiograms             4.  Percutaneous transluminal angioplasty of bilateral common iliac arteries with 5 mm balloons to predilate the lesions             5.  Balloon expandable stent placement in a kissing stent fashion to bilateral common iliac arteries with 9 mm diameter by 58 mm length lifestream stents bilaterally  6.  StarClose closure device bilateral femoral arteries  EBL: 10 cc  Contrast: 50 cc  Fluoro Time: 5.2 minutes  Moderate Conscious Sedation Time: approximately 40 minutes using 1 mg of Versed and 25 mcg of Fentanyl              Indications:  Patient is a 89 y.o.male with a gangrenous tip of his left third toe and pain in both feet. The patient has had left lower extremity infrainguinal revascularization earlier this week.  The patient is brought in for angiography for further evaluation and potential treatment.  Due to the limb threatening nature of the situation, angiogram was performed for attempted limb salvage. The patient is aware that if the procedure fails, amputation would be expected.  The patient also understands that even with successful revascularization, amputation may still be required due to the severity of the situation.  Risks and benefits are discussed and informed consent is obtained.   Procedure:  The patient was identified and appropriate procedural time out was performed.  The patient was then placed supine on the table and prepped and draped in the usual sterile fashion.  Moderate conscious sedation was administered during a face to face encounter with the patient throughout the procedure with my supervision of the RN administering medicines and monitoring the patient's vital signs, pulse oximetry, telemetry and mental status throughout from the start of the procedure until the patient was taken to the recovery room. Ultrasound was used to evaluate the left common femoral artery.  It was patent .  A digital ultrasound image was acquired.  A Seldinger needle was used to access the left common femoral artery under direct ultrasound guidance and a permanent image was performed.  A 0.035 J wire was advanced without resistance and a 5Fr sheath was placed.  Pigtail catheter was placed into the aorta and an AP aortogram was performed. This demonstrated normal renal arteries and normal aorta down to the terminal aorta where both common iliac arteries were heavily diseased with highly calcific stenosis.  Both the stenoses were greater than 80%.  No left hypogastric artery was seen.  A patent right hypogastric artery was seen.  Both external iliac and common femoral arteries appear to be patent.  It was felt that it was in the patient's best interest to proceed with intervention after these images to avoid a second procedure and a larger amount of contrast and fluoroscopy based off of the findings from the initial angiogram.  A micropuncture needle  was used to access the right femoral artery and this was accessed under direct ultrasound guidance without difficulty and a permanent image was recorded.  A micropuncture wire and sheath were then placed.  I then upsized to a 7 [place] 23 cm sheath on the right.  The patient was systemically heparinized and a 7 [place] 23 cm sheath placed on the left side and then on the right side to get up into the aorta.  We predilated the common iliac arteries with 5 mm diameter balloons bilaterally.  We then selected 9 mm diameter by 58 mm length lifestream  stents on both sides and deployed the simultaneously in a kissing balloon fashion from the terminal aorta through both common iliac arteries down to just above the right hypogastric artery and in the distal left common iliac artery.  These were inflated to 12 atm.  Completion imaging showed marked improvement with less than 20% residual stenosis in both common iliac arteries.  I elected to terminate the procedure. The sheath was removed and StarClose closure device was deployed in the left femoral artery with excellent hemostatic result.  Similarly, StarClose closure device was deployed on the right with excellent hemostatic result as well.  The patient was taken to the recovery room in stable condition having tolerated the procedure well.  Findings:               Aortogram:  This demonstrated normal renal arteries and normal aorta down to the terminal aorta where both common iliac arteries were heavily diseased with highly calcific stenosis.  Both the stenoses were greater than 80%.  No left hypogastric artery was seen.  A patent right hypogastric artery was seen.  Both external iliac and common femoral arteries appear to be patent.                Disposition: Patient was taken to the recovery room in stable condition having tolerated the procedure well.  Complications: None  [name] 06/14/2022 11:30 AM   This note was created with Dragon Medical transcription system. Any errors in dictation are purely unintentional.

## 2022-06-14 NOTE — Plan of Care (Signed)

## 2022-06-14 NOTE — Interval H&P Note (Signed)
History and Physical Interval Note:  06/14/2022 9:59 AM  Charles Jennings  has presented today for surgery, with the diagnosis of Bilateral Lower extremity ischemia..  The various methods of treatment have been discussed with the patient and family. After consideration of risks, benefits and other options for treatment, the patient has consented to  Procedure(s): LOWER EXTREMITY INTERVENTION (Bilateral) as a surgical intervention.  [name] history has been reviewed, patient examined, no change in status, stable for surgery.  I have reviewed the patient's chart and labs.  Questions were answered to the patient's satisfaction.     [name]

## 2022-06-14 NOTE — Progress Notes (Signed)
PT Cancellation Note  Patient Details Name: Charles Jennings MRN: [id number] DOB: 09/11/1927   Cancelled Treatment:    Reason Eval/Treat Not Completed: Patient at procedure or test/unavailable. Patient having stents placed today and is off unit. Will evaluate when medically appropriate.    [name] 06/14/2022, 9:28 AM

## 2022-06-14 NOTE — Care Management Important Message (Signed)
Important Message  Patient Details  Name: Charles Jennings MRN: [id number] Date of Birth: 1927-10-01   Medicare Important Message Given:  Yes  Patient out of room upon time of visit.  Copy of Medicare IM left in room on bedside tray for reference.   Jahmya Onofrio 06/14/2022, 10:20 AM

## 2022-06-14 NOTE — Progress Notes (Signed)
PROGRESS NOTE  Charles Jennings    DOB: [birth year], 89 y.o.  EQA:[id number]    Code Status: DNR   DOA: 06/11/2022   LOS: 3   Brief hospital course  Charles Jennings is a 89 y.o. male with a PMH significant for HFpEF, complete heart block s/p PPM, hypertension, hyperlipidemia, type 2 diabetes, PAD .  They presented from home to the ED on 06/11/2022 with toe pain x several days. Charles Jennings states he had his toe nails cut on 04/13/2022 at his podiatrist's office. Shortly thereafter, he noticed that his left toes appeared irritated and he was concerned for infection. Since then, he has been cleaning his feet daily with soap/water, hydrogen peroxide and using triple antibacterial ointment. He felt all his toes were improving except the middle left toe. In the past 1 week, he has noticed significant swelling of his left foot and it has been progressively more painful to walk.  Due to this, he came to the ED today.  He denies any fever, chills.  He denies any erythema going into his foot.  He denies any purulent drainage but states that he believes some of the dark area on his middle toes dried blood.   In the ED, it was found that they had afebrile 98.2 with heart rate 93 and blood pressure 172/71.  Significant findings included WBC of 9.5, hemoglobin of 17.8, sodium of 131, glucose of 275, BUN of 27, creatinine 1.28, alkaline phosphatase of 188 and a calculated GFR 53. Lactic acid within normal limits. ESR within normal limits. Foot x-ray was obtained that did not show any evidence of bony destruction.  They were initially treated with heparin infusion.  vascular surgery was consulted.  1/8- underwent angioplasty with stent placement of LLE. Improvement in arterial blood flow  1/9- remains on heparin gtt. Had event of bleeding at surgical site overnight which resolved with reapplying the pressure dressing.   06/14/22 -stable, repeat angiogram, angioplasty, stent placement  Assessment & Plan   Principal Problem:   Limb ischemia Active Problems:   Diabetes mellitus ([place])   Mixed hyperlipidemia   Benign essential HTN   CKD (chronic kidney disease)   Critical limb ischemia of left lower extremity (HCC)   Nausea and vomiting  Limb ischemia- On examination, there is an area of ulceration with surrounding erythema and significant tenderness to palpation of left 3rd toe. CT negative for signs of osteomyelitis. Patient states that he walks 5 miles per day at baseline. Pedal pulses palpable. Incision site without bleeding or apparent hematoma.  - Vascular surgery consulted; appreciate their recommendations  - angiogram with stent placement 1/8  - repeat procedure planned 1/10 to stent bilateral iliac arteries - Heparin infusion per pharmacy dosing - f/u arteriogram - continue ceftriaxone and stopped metronidazole  Nausea/vomiting- resolved.  - discontinue flagyl - zofran PRN   Diabetes mellitus ([place]) - Hold home NovoLog 70/30, and Jardiance - A1c pending - Semglee 10 units daily - SSI, sensitive   Mixed hyperlipidemia- well controlled.  - Start atorvastatin 40 mg daily - Discontinue home simvastatin   Essential HTN- mildly hypertensive on presentation, however asymptomatic.   - Resume home antihypertensives   AKI- resolved and reappeared.  Creatinine is elevated on admission at 1.28>>1.07>1.29 with prior creatinine 7 years ago of 1.02.  - BMP am  Body mass index is 21.77 kg/m.  VTE ppx:  heparin gtt  Diet:     Diet   Diet NPO time specified   Consultants: Vascular  surgery   Subjective 06/14/22    Pt reports pain in toe is improved. Denies any more episodes of nausea/vomiting. No signs of bleeding.    Objective   Vitals:   06/13/22 0750 06/13/22 1600 06/13/22 1943 06/14/22 0331  BP: 107/63 (!) 120/57 106/64 (!) 107/41  Pulse: 82 66 69 80  Resp: '18 17 18 18  '$ Temp: 97.8 F (36.6 C) 97.6 F (36.4 C) 98.3 F (36.8 C) 98.3 F (36.8 C)  TempSrc: Oral  Oral  Oral  SpO2: 90% 91% (!) 87% 90%  Weight:      Height:        Intake/Output Summary (Last 24 hours) at 06/14/2022 0725 Last data filed at 06/14/2022 0211 Gross per 24 hour  Intake 450 ml  Output 1200 ml  Net -750 ml    Filed Weights   06/11/22 1541  Weight: 63 kg     Physical Exam:  General: awake, alert, NAD HEENT: atraumatic, clear conjunctiva, anicteric sclera, MMM, hearing grossly normal Respiratory: normal respiratory effort. Cardiovascular:  normal S1/S2, RRR, no JVD Nervous: A&O x3. no gross focal neurologic deficits, normal speech Extremities: moves all equally, no edema, normal tone. Positive pedal pulses bilaterally Skin: positive for necrosis of distal left 3rd toe Psychiatry: normal mood, congruent affect  Labs   I have personally reviewed the following labs and imaging studies CBC    Component Value Date/Time   WBC 9.0 06/14/2022 0407   RBC 5.00 06/14/2022 0407   HGB 14.8 06/14/2022 0407   HGB 15.5 01/22/2013 1546   HCT 44.7 06/14/2022 0407   HCT 45.4 01/22/2013 1546   PLT 154 06/14/2022 0407   PLT 203 01/22/2013 1546   MCV 89.4 06/14/2022 0407   MCV 88 01/22/2013 1546   MCH 29.6 06/14/2022 0407   MCHC 33.1 06/14/2022 0407   RDW 13.3 06/14/2022 0407   RDW 13.9 01/22/2013 1546   LYMPHSABS 1.9 06/11/2022 1547   MONOABS 1.0 06/11/2022 1547   EOSABS 0.0 06/11/2022 1547   BASOSABS 0.0 06/11/2022 1547      Latest Ref Rng & Units 06/14/2022    4:07 AM 06/13/2022    4:24 AM 06/12/2022    6:57 AM  BMP  Glucose 70 - 99 mg/dL 176  187  130   BUN 8 - 23 mg/dL 41  27  23   Creatinine 0.61 - 1.24 mg/dL 1.29  1.07  1.03   Sodium 135 - 145 mmol/L 134  134  133   Potassium 3.5 - 5.1 mmol/L 4.7  5.0  4.5   Chloride 98 - 111 mmol/L 101  98  100   CO2 22 - 32 mmol/L '24  23  22   '$ Calcium 8.9 - 10.3 mg/dL 8.6  9.0  8.6     PERIPHERAL VASCULAR CATHETERIZATION  Result Date: 06/12/2022 See surgical note for result.   Disposition Plan & Communication  Patient  status: Inpatient  Admitted From: Home Planned disposition location: Home Anticipated discharge date: 1/11 pending completing vascular workup  Family Communication: none at bedside     Author: Richarda Osmond, DO [facility] 06/14/2022, 7:25 AM   Available by Epic secure chat 7AM-7PM. If 7PM-7AM, please contact night-coverage.  [facility] contact information found on [email or web address].

## 2022-06-14 NOTE — Progress Notes (Signed)
OT Cancellation Note  Patient Details Name: Charles Jennings MRN: [id number] DOB: Jun 15, 1927   Cancelled Treatment:    Reason Eval/Treat Not Completed: Patient at procedure or test/ unavailable. OT orders received, chart reviewed. Pt currently off the floor at procedure. Having stent placed. Will re-attempt as able and as pt medically appropriate.  Doneta Public 06/14/2022, 9:23 AM

## 2022-06-14 NOTE — Consult Note (Signed)
[place] for heparin drip Indication:  limb ischemia  No Known Allergies  Patient Measurements: Height: '5\' 7"'$  (170.2 cm) Weight: 63 kg (139 lb) IBW/kg (Calculated) : 66.1 Heparin Dosing Weight: 63 kg  Vital Signs: Temp: 98.3 F (36.8 C) (01/10 0331) Temp Source: Oral (01/10 0331) BP: 107/41 (01/10 0331) Pulse Rate: 80 (01/10 0331)  Labs: Recent Labs    06/11/22 2022 06/12/22 0657 06/13/22 0001 06/13/22 0424 06/13/22 1007 06/13/22 1835 06/14/22 0407  HGB  --  15.4  --  16.9  --   --  14.8  HCT  --  47.9  --  51.9  --   --  44.7  PLT  --  143*  --  180  --   --  154  APTT 29  --   --   --   --   --   --   LABPROT 12.9  --   --   --   --   --   --   INR 1.0  --   --   --   --   --   --   HEPARINUNFRC  --  0.95*   < >  --  0.48 0.37 0.47  CREATININE  --  1.03  --  1.07  --   --  1.29*   < > = values in this interval not displayed.    Estimated Creatinine Clearance: 32.6 mL/min (A) (by C-G formula based on SCr of 1.29 mg/dL (H)).  Medical History: Past Medical History:  Diagnosis Date   Diabetes mellitus without complication (HCC)    Elevated cholesterol    Hypertension     Medications:  No anticoagulation per pharmacist review  Assessment: 89 yo male presented to ED with complaint of toe pain.  Pharmacy consulted to start heparin drip due to concern of limb ischemia. Patient is post-op day 1 from left lower extremity angiogram with angioplasty and stent placement. Patient has bilateral iliac occlusions/stenosis and will be taken back to the vascular lab on Wednesday for bilateral iliac stent placement.  Baseline Labs: aPTT - 29; INR - 1.0 Hgb - 17.8; Plts - 187  Date Time aPTT/HL Rate/Comment 1/8 0657 --/0.95  Supratherapeutic, 1000 units/hr 1/9 0001 0.76  Supratherapeutic 1/9 1007 0.48  Therapeutic x 1 1/9 1835 0.37  Therapeutic x 2 1/10 0407 0.47  Therapeutic x 3  Plts 187 >> 143 >> 180 - monitor    Goal of  Therapy:  Heparin level 0.3-0.7 units/ml Monitor platelets by anticoagulation protocol: Yes   Plan: Continue heparin infusion at 750 units/hr Recheck HL daily with AM labs Daily CBC while on heparin drip  Renda Rolls, PharmD, [facility] 06/14/2022 6:07 AM

## 2022-06-15 ENCOUNTER — Encounter: Payer: Self-pay | Admitting: Vascular Surgery

## 2022-06-15 DIAGNOSIS — I70245 Atherosclerosis of native arteries of left leg with ulceration of other part of foot: Secondary | ICD-10-CM

## 2022-06-15 LAB — CBC
HCT: 39.9 % (ref 39.0–52.0)
Hemoglobin: 13.2 g/dL (ref 13.0–17.0)
MCH: 29.7 pg (ref 26.0–34.0)
MCHC: 33.1 g/dL (ref 30.0–36.0)
MCV: 89.9 fL (ref 80.0–100.0)
Platelets: 149 10*3/uL — ABNORMAL LOW (ref 150–400)
RBC: 4.44 MIL/uL (ref 4.22–5.81)
RDW: 13.3 % (ref 11.5–15.5)
WBC: 7.1 10*3/uL (ref 4.0–10.5)
nRBC: 0 % (ref 0.0–0.2)

## 2022-06-15 LAB — BASIC METABOLIC PANEL
Anion gap: 8 (ref 5–15)
BUN: 33 mg/dL — ABNORMAL HIGH (ref 8–23)
CO2: 21 mmol/L — ABNORMAL LOW (ref 22–32)
Calcium: 8.2 mg/dL — ABNORMAL LOW (ref 8.9–10.3)
Chloride: 102 mmol/L (ref 98–111)
Creatinine, Ser: 1.15 mg/dL (ref 0.61–1.24)
GFR, Estimated: 60 mL/min — ABNORMAL LOW (ref >60)
Glucose, Bld: 189 mg/dL — ABNORMAL HIGH (ref 70–99)
Potassium: 4.7 mmol/L (ref 3.5–5.1)
Sodium: 131 mmol/L — ABNORMAL LOW (ref 135–145)

## 2022-06-15 LAB — GLUCOSE, CAPILLARY: Glucose-Capillary: 186 mg/dL — ABNORMAL HIGH (ref 70–99)

## 2022-06-15 LAB — HEPARIN LEVEL (UNFRACTIONATED): Heparin Unfractionated: 0.37 IU/mL (ref 0.30–0.70)

## 2022-06-15 MED ORDER — CLOPIDOGREL BISULFATE 75 MG PO TABS: 75.0000 mg | ORAL_TABLET | Freq: Every day | ORAL | 0 refills | Status: DC

## 2022-06-15 MED ORDER — ATORVASTATIN CALCIUM 40 MG PO TABS: 40.0000 mg | ORAL_TABLET | Freq: Every evening | ORAL | 0 refills | Status: DC

## 2022-06-15 MED ORDER — NOVOLIN 70/30 (70-30) 100 UNIT/ML ~~LOC~~ SUSP: 10.0000 [IU] | Freq: Every day | SUBCUTANEOUS | 11 refills | Status: DC

## 2022-06-15 MED ORDER — HYDROCODONE-ACETAMINOPHEN 5-325 MG PO TABS: 1.0000 | ORAL_TABLET | Freq: Two times a day (BID) | ORAL | 0 refills | Status: AC | PRN

## 2022-06-15 NOTE — Progress Notes (Incomplete)
PROGRESS NOTE  Charles Jennings    DOB: 03-22-1928, 89 y.o.  YWV:[id number]    Code Status: DNR   DOA: 06/11/2022   LOS: 4   Brief hospital course  Charles Jennings is a 89 y.o. male with a PMH significant for HFpEF, complete heart block s/p PPM, hypertension, hyperlipidemia, type 2 diabetes, PAD .  They presented from home to the ED on 06/11/2022 with toe pain x several days. Charles Jennings states he had his toe nails cut on 04/13/2022 at his podiatrist's office. Shortly thereafter, he noticed that his left toes appeared irritated and he was concerned for infection. Since then, he has been cleaning his feet daily with soap/water, hydrogen peroxide and using triple antibacterial ointment. He felt all his toes were improving except the middle left toe. In the past 1 week, he has noticed significant swelling of his left foot and it has been progressively more painful to walk.  Due to this, he came to the ED today.  He denies any fever, chills.  He denies any erythema going into his foot.  He denies any purulent drainage but states that he believes some of the dark area on his middle toes dried blood.   In the ED, it was found that they had afebrile 98.2 with heart rate 93 and blood pressure 172/71.  Significant findings included WBC of 9.5, hemoglobin of 17.8, sodium of 131, glucose of 275, BUN of 27, creatinine 1.28, alkaline phosphatase of 188 and a calculated GFR 53. Lactic acid within normal limits. ESR within normal limits. Foot x-ray was obtained that did not show any evidence of bony destruction.  They were initially treated with heparin infusion.  vascular surgery was consulted.  1/8- underwent angioplasty with stent placement of LLE. Improvement in arterial blood flow  1/9- remains on heparin gtt. Had event of bleeding at surgical site overnight which resolved with reapplying the pressure dressing.   06/15/22 -stable, repeat angiogram, angioplasty, stent placement  Assessment & Plan   Principal Problem:   Limb ischemia Active Problems:   Diabetes mellitus ([place])   Mixed hyperlipidemia   Benign essential HTN   CKD (chronic kidney disease)   Critical limb ischemia of left lower extremity (HCC)   Nausea and vomiting  Limb ischemia- On examination, there is an area of ulceration with surrounding erythema and significant tenderness to palpation of left 3rd toe. CT negative for signs of osteomyelitis. Patient states that he walks 5 miles per day at baseline. Pedal pulses palpable. Incision site without bleeding or apparent hematoma.  - Vascular surgery consulted; appreciate their recommendations  - angiogram with stent placement 1/8  - repeat procedure planned 1/10 to stent bilateral iliac arteries - Heparin infusion per pharmacy dosing - f/u arteriogram - continue ceftriaxone and stopped metronidazole  Nausea/vomiting- resolved.  - discontinue flagyl - zofran PRN   Diabetes mellitus ([place]) - Hold home NovoLog 70/30, and Jardiance - A1c pending - Semglee 10 units daily - SSI, sensitive   Mixed hyperlipidemia- well controlled.  - Start atorvastatin 40 mg daily - Discontinue home simvastatin   Essential HTN- mildly hypertensive on presentation, however asymptomatic.   - Resume home antihypertensives   AKI- resolved and reappeared.  Creatinine is elevated on admission at 1.28>>1.07>1.29 with prior creatinine 7 years ago of 1.02.  - BMP am  Body mass index is 21.77 kg/m.  VTE ppx:  heparin gtt  Diet:     Diet   Diet regular Room service appropriate? Yes; Fluid consistency:  Thin   Consultants: Vascular surgery   Subjective 06/15/22    Pt reports pain in toe is improved. Denies any more episodes of nausea/vomiting. No signs of bleeding.    Objective   Vitals:   06/14/22 1245 06/14/22 1308 06/14/22 1629 06/14/22 1936  BP: (!) 130/53 (!) 132/48 (!) 115/49 (!) 111/45  Pulse: 80 69 77 71  Resp: '13 16 18 20  '$ Temp:  97.6 F (36.4 C) 97.6 F (36.4 C)  98.1 F (36.7 C)  TempSrc:  Oral    SpO2: 94% 90% 90% 93%  Weight:      Height:        Intake/Output Summary (Last 24 hours) at 06/15/2022 0726 Last data filed at 06/15/2022 0600 Gross per 24 hour  Intake 516.49 ml  Output 850 ml  Net -333.51 ml    Filed Weights   06/11/22 1541  Weight: 63 kg     Physical Exam:  General: awake, alert, NAD HEENT: atraumatic, clear conjunctiva, anicteric sclera, MMM, hearing grossly normal Respiratory: normal respiratory effort. Cardiovascular:  normal S1/S2, RRR, no JVD Nervous: A&O x3. no gross focal neurologic deficits, normal speech Extremities: moves all equally, no edema, normal tone. Positive pedal pulses bilaterally Skin: positive for necrosis of distal left 3rd toe Psychiatry: normal mood, congruent affect  Labs   I have personally reviewed the following labs and imaging studies CBC    Component Value Date/Time   WBC 7.1 06/15/2022 0406   RBC 4.44 06/15/2022 0406   HGB 13.2 06/15/2022 0406   HGB 15.5 01/22/2013 1546   HCT 39.9 06/15/2022 0406   HCT 45.4 01/22/2013 1546   PLT 149 (L) 06/15/2022 0406   PLT 203 01/22/2013 1546   MCV 89.9 06/15/2022 0406   MCV 88 01/22/2013 1546   MCH 29.7 06/15/2022 0406   MCHC 33.1 06/15/2022 0406   RDW 13.3 06/15/2022 0406   RDW 13.9 01/22/2013 1546   LYMPHSABS 1.9 06/11/2022 1547   MONOABS 1.0 06/11/2022 1547   EOSABS 0.0 06/11/2022 1547   BASOSABS 0.0 06/11/2022 1547      Latest Ref Rng & Units 06/15/2022    4:06 AM 06/14/2022    4:07 AM 06/13/2022    4:24 AM  BMP  Glucose 70 - 99 mg/dL 189  176  187   BUN 8 - 23 mg/dL 33  41  27   Creatinine 0.61 - 1.24 mg/dL 1.15  1.29  1.07   Sodium 135 - 145 mmol/L 131  134  134   Potassium 3.5 - 5.1 mmol/L 4.7  4.7  5.0   Chloride 98 - 111 mmol/L 102  101  98   CO2 22 - 32 mmol/L '21  24  23   '$ Calcium 8.9 - 10.3 mg/dL 8.2  8.6  9.0     PERIPHERAL VASCULAR CATHETERIZATION  Result Date: 06/14/2022 See surgical note for result.    Disposition Plan & Communication  Patient status: Inpatient  Admitted From: Home Planned disposition location: Home Anticipated discharge date: 1/11 pending completing vascular workup  Family Communication: none at bedside     Author: Richarda Osmond, DO [facility] 06/15/2022, 7:26 AM   Available by Epic secure chat 7AM-7PM. If 7PM-7AM, please contact night-coverage.  [facility] contact information found on [email or web address].

## 2022-06-15 NOTE — Discharge Summary (Signed)
Physician Discharge Summary  Patient: Charles Jennings IOX:[id number] DOB: Oct 23, 1927   Code Status: DNR Admit date: 06/11/2022 Discharge date: 06/15/2022 Disposition: Home, PT and OT PCP: Albina Billet, MD  Recommendations for Outpatient Follow-up:  Follow up with PCP within 1-2 weeks Regarding general hospital follow up and preventative care Recommend CBC Follow up with vascular surgery  Discharge Diagnoses:  Principal Problem:   Limb ischemia Active Problems:   Diabetes mellitus ([place])   Mixed hyperlipidemia   Benign essential HTN   CKD (chronic kidney disease)   Critical limb ischemia of left lower extremity (HCC)   Nausea and vomiting  Brief Hospital Course Summary: Charles Jennings is a 89 y.o. male with a PMH significant for HFpEF, complete heart block s/p PPM, hypertension, hyperlipidemia, type 2 diabetes, PAD .   They presented from home to the ED on 06/11/2022 with toe pain x several days. Charles Jennings states he had his toe nails cut on 04/13/2022 at his podiatrist's office. Shortly thereafter, he noticed that his left toes appeared irritated and he was concerned for infection. Since then, he has been cleaning his feet daily with soap/water, hydrogen peroxide and using triple antibacterial ointment. He felt all his toes were improving except the middle left toe. In the past 1 week, he has noticed significant swelling of his left foot and it has been progressively more painful to walk.  Due to this, he came to the ED today.  He denies any fever, chills.  He denies any erythema going into his foot.  He denies any purulent drainage but states that he believes some of the dark area on his middle toes dried blood.    In the ED, it was found that they had afebrile 98.2 with heart rate 93 and blood pressure 172/71.  Significant findings included WBC of 9.5, hemoglobin of 17.8, sodium of 131, glucose of 275, BUN of 27, creatinine 1.28, alkaline phosphatase of 188 and a calculated GFR  53. Lactic acid within normal limits. ESR within normal limits. Foot x-ray was obtained that did not show any evidence of bony destruction.   They were initially treated with heparin infusion for gangrene of left third toe.  vascular surgery was consulted.   1/8- underwent angioplasty with stent placement of LLE. Improvement in arterial blood flow   1/9- remains on heparin gtt. Had event of bleeding at surgical site overnight which resolved with reapplying the pressure dressing.    06/15/22 -stable, repeat angiogram, angioplasty, stent placement. Patient tolerated well. No post op bleeding. He was ambulatory with good pain control following the procedure.  He was transitioned from the heparin drip to plavix and aspirin, per vascular recommendations. His statin medication was changed to atorvastatin '40mg'$ . He was cleared for discharge. He did receive IV Abx while admitted for his toe for possible soft tissue infection. Remained afebrile with normal WBC count. Abx were not continued at dc.   All other chronic conditions were treated with home medications.   He was evaluated by PT/OT who recommended home health PT/OT.   Discharge Condition: Good, improved Recommended discharge diet: Regular healthy diet  Consultations: Vascular surgery   Procedures/Studies: Angiogram/angioplasty   Discharge Instructions     Discharge patient   Complete by: As directed    Discharge disposition: 01-Home or Self Care   Discharge patient date: 06/15/2022      Allergies as of 06/15/2022   No Known Allergies      Medication List  STOP taking these medications    pentoxifylline 400 MG CR tablet Commonly known as: TRENTAL   simvastatin 10 MG tablet Commonly known as: ZOCOR       TAKE these medications    Aspir-Low 81 MG tablet Generic drug: aspirin EC Take 81 mg by mouth daily.   atorvastatin 40 MG tablet Commonly known as: LIPITOR Take 1 tablet (40 mg total) by mouth every  evening.   clopidogrel 75 MG tablet Commonly known as: PLAVIX Take 1 tablet (75 mg total) by mouth daily. Start taking on: June 16, 2022   diltiazem 240 MG 24 hr capsule Commonly known as: CARDIZEM CD Take 240 mg by mouth daily.   gabapentin 300 MG capsule Commonly known as: NEURONTIN Take 300 mg by mouth daily.   HYDROcodone-acetaminophen 5-325 MG tablet Commonly known as: NORCO/VICODIN Take 1 tablet by mouth every 12 (twelve) hours as needed for up to 5 days for severe pain or moderate pain.   Jardiance 10 MG Tabs tablet Generic drug: empagliflozin Take 10 mg by mouth daily.   lisinopril 40 MG tablet Commonly known as: ZESTRIL Take 40 mg by mouth daily.   loratadine 10 MG tablet Commonly known as: CLARITIN Take 1 tablet by mouth daily.   metFORMIN 500 MG tablet Commonly known as: GLUCOPHAGE Take 500 mg by mouth 2 (two) times daily with a meal.   NovoLIN 70/30 (70-30) 100 UNIT/ML injection Generic drug: insulin NPH-regular Human Inject 10 Units into the skin daily with breakfast. What changed:  how to take this when to take this       Subjective   Pt reports feeling overall well. He denies pain at rest. He has been ambulatory since the procedure. Denies any bleeding at access site.  Denies CP, SOB, LE swelling.  All questions and concerns were addressed at time of discharge.  Objective  Blood pressure (!) 124/44, pulse 72, temperature 98 F (36.7 C), temperature source Oral, resp. rate 16, height '5\' 7"'$  (1.702 m), weight 63 kg, SpO2 90 %.   General: Pt is alert, awake, not in acute distress Cardiovascular: RRR, S1/S2 +, no rubs, no gallops Respiratory: CTA bilaterally, no wheezing, no rhonchi Abdominal: Soft, NT, ND, bowel sounds + Extremities: no edema, no cyanosis. Patent pedal pulses bilaterally. Necrotic tissue of left third toe remains.   The results of significant diagnostics from this hospitalization (including imaging, microbiology, ancillary and  laboratory) are listed below for reference.   Imaging studies: PERIPHERAL VASCULAR CATHETERIZATION  Result Date: 06/14/2022 See surgical note for result.  PERIPHERAL VASCULAR CATHETERIZATION  Result Date: 06/12/2022 See surgical note for result.  CT FOOT LEFT WO CONTRAST  Result Date: 06/11/2022 CLINICAL DATA:  Osteomyelitis suspected, foot, xray done EXAM: CT OF THE LEFT FOOT WITHOUT CONTRAST TECHNIQUE: Multidetector CT imaging of the left foot was performed according to the standard protocol. Multiplanar CT image reconstructions were also generated. RADIATION DOSE REDUCTION: This exam was performed according to the departmental dose-optimization program which includes automated exposure control, adjustment of the mA and/or kV according to patient size and/or use of iterative reconstruction technique. COMPARISON:  X-ray left foot 06/11/2022 FINDINGS: Bones/Joint/Cartilage Diffusely decreased bone density. No evidence of fracture, dislocation, or joint effusion. No evidence of severe arthropathy. No aggressive appearing focal bone abnormality. Ligaments Suboptimally assessed by CT. Muscles and Tendons Grossly unremarkable. Soft tissues Diffuse subcutaneus soft tissue edema. IMPRESSION: 1. No CT findings to suggest osteomyelitis. If high clinical concern, please consider MRI for further evaluation (with intravenous contrast if  GFR greater than 30). 2.  No acute displaced fracture or dislocation. 3. Diffusely decreased bone density. Electronically Signed   By: Iven Finn M.D.   On: 06/11/2022 20:11   DG Foot Complete Left  Result Date: 06/11/2022 CLINICAL DATA:  Foot pain, necrotic toes EXAM: LEFT FOOT - COMPLETE 3+ VIEW COMPARISON:  None Available. FINDINGS: Bones are diffusely osteopenic. No evidence for fracture, dislocation or cortical destruction. There are mild degenerative changes of the second distal interphalangeal joint. IMPRESSION: 1. No acute osseous abnormality. 2. Osteopenia.  Electronically Signed   By: Ronney Asters M.D.   On: 06/11/2022 16:36    Labs: Basic Metabolic Panel: Recent Labs  Lab 06/11/22 1547 06/12/22 0657 06/13/22 0424 06/14/22 0407 06/15/22 0406  NA 131* 133* 134* 134* 131*  K 4.4 4.5 5.0 4.7 4.7  CL 95* 100 98 101 102  CO2 '22 22 23 24 '$ 21*  GLUCOSE 275* 130* 187* 176* 189*  BUN 27* 23 27* 41* 33*  CREATININE 1.28* 1.03 1.07 1.29* 1.15  CALCIUM 9.4 8.6* 9.0 8.6* 8.2*   CBC: Recent Labs  Lab 06/11/22 1547 06/12/22 0657 06/13/22 0424 06/14/22 0407 06/15/22 0406  WBC 9.5 6.8 9.5 9.0 7.1  NEUTROABS 6.6  --   --   --   --   HGB 17.8* 15.4 16.9 14.8 13.2  HCT 54.0* 47.9 51.9 44.7 39.9  MCV 90.0 91.8 89.2 89.4 89.9  PLT 187 143* 180 154 149*   Microbiology: Results for orders placed or performed during the hospital encounter of 06/11/22  MRSA Next Gen by PCR, Nasal     Status: None   Collection Time: 06/12/22  6:57 AM   Specimen: Nasal Mucosa; Nasal Swab  Result Value Ref Range Status   MRSA by PCR Next Gen NOT DETECTED NOT DETECTED Final    Comment: (NOTE) The GeneXpert MRSA Assay (FDA approved for NASAL specimens only), is one component of a comprehensive MRSA colonization surveillance program. It is not intended to diagnose MRSA infection nor to guide or monitor treatment for MRSA infections. Test performance is not FDA approved in patients less than 80 years old. Performed at [facility], [address] [place], [place] [address]    Time coordinating discharge: Over 30 minutes  Richarda Osmond, MD  [facility] 06/15/2022, 11:02 AM

## 2022-06-15 NOTE — Plan of Care (Signed)

## 2022-06-15 NOTE — Plan of Care (Signed)
Discussed the importance of looking at skin/body every day for changes and note them, If fall or bump/ hit head notify Ronalee Belts (son) and come to the ED to get checked out, Removed both PIV's, New dressings applied to femorals access areas-no bleeding noted, Ronalee Belts reviewed medications changes and told him where to pick up new meds from. Spoke with patient about protecting skin from injury and to keep it moving. Patient discharged with AVS in hand Problem: Coping: Goal: Ability to adjust to condition or change in health will improve Outcome: Adequate for Discharge   Problem: Fluid Volume: Goal: Ability to maintain a balanced intake and output will improve Outcome: Adequate for Discharge   Problem: Skin Integrity: Goal: Risk for impaired skin integrity will decrease Outcome: Adequate for Discharge   Problem: Elimination: Goal: Will not experience complications related to bowel motility Outcome: Adequate for Discharge   Problem: Pain Managment: Goal: General experience of comfort will improve Outcome: Adequate for Discharge   Problem: Safety: Goal: Ability to remain free from injury will improve Outcome: Adequate for Discharge   Problem: Skin Integrity: Goal: Risk for impaired skin integrity will decrease Outcome: Adequate for Discharge

## 2022-06-15 NOTE — Progress Notes (Signed)
Mobility Specialist - Progress Note   06/15/22 0917  Mobility  Activity Turned to right side;Turned to left side (bed level exercises)  Level of Assistance Independent  Assistive Device None  Range of Motion/Exercises Active;Right leg;Left leg  Activity Response Tolerated well  Mobility Referral Yes  $Mobility charge 1 Mobility   Pt supine upon entry, utilizing RA. Pt denied OOB transfer to chair, stated that "once I leave from here I'll walk 5 miles". Pt educated on the importance of getting OOB and being mobile to their highest ability. Pt completed bed level exercises such as ankle circles, ankle pumps, leg raises and short quad stretch. MS noticed LLE weakness compared to the RLE. Pt left supine with alarm set and needs within reach.   Charles Jennings Mobility Specialist 06/15/22 9:22 AM

## 2022-06-15 NOTE — Discharge Instructions (Signed)
Please take aspirin and plavix every day to prevent clots in your new stents.  Follow up with vascular surgery within 3 weeks. Their office will set up a follow up appointment with you. I recommend you take your insulin in the morning instead of at night since it is safer to use it when you will be awake and able to monitor yourself for signs of low blood sugars.   Continue to stay active and stay young!

## 2022-06-15 NOTE — Consult Note (Signed)
[facility] for heparin drip Indication:  limb ischemia  No Known Allergies  Patient Measurements: Height: '5\' 7"'$  (170.2 cm) Weight: 63 kg (139 lb) IBW/kg (Calculated) : 66.1 Heparin Dosing Weight: 63 kg  Vital Signs: Temp: 98.1 F (36.7 C) (01/10 1936) BP: 111/45 (01/10 1936) Pulse Rate: 71 (01/10 1936)  Labs: Recent Labs    06/13/22 0424 06/13/22 1007 06/13/22 1835 06/14/22 0407 06/15/22 0406  HGB 16.9  --   --  14.8 13.2  HCT 51.9  --   --  44.7 39.9  PLT 180  --   --  154 149*  HEPARINUNFRC  --    < > 0.37 0.47 0.37  CREATININE 1.07  --   --  1.29* 1.15   < > = values in this interval not displayed.    Estimated Creatinine Clearance: 36.6 mL/min (by C-G formula based on SCr of 1.15 mg/dL).  Medical History: Past Medical History:  Diagnosis Date   Diabetes mellitus without complication (HCC)    Elevated cholesterol    Hypertension     Medications:  No anticoagulation per pharmacist review  Assessment: 89 yo male presented to ED with complaint of toe pain.  Pharmacy consulted to start heparin drip due to concern of limb ischemia. Patient is post-op day 1 from left lower extremity angiogram with angioplasty and stent placement. Patient has bilateral iliac occlusions/stenosis and will be taken back to the vascular lab on Wednesday for bilateral iliac stent placement.  Baseline Labs: aPTT - 29; INR - 1.0 Hgb - 17.8; Plts - 187  Date Time aPTT/HL Rate/Comment 1/8 0657 --/0.95  Supratherapeutic, 1000 units/hr 1/9 0001 0.76  Supratherapeutic 1/9 1007 0.48  Therapeutic x 1 1/9 1835 0.37  Therapeutic x 2 1/10 0407 0.47  Therapeutic x 3 1/11 0406 0.37  Therapeutic x 4  Plts 187 >> 143 >> 180 - monitor    Goal of Therapy:  Heparin level 0.3-0.7 units/ml Monitor platelets by anticoagulation protocol: Yes   Plan: Continue heparin infusion at 750 units/hr Recheck HL daily with AM labs Daily CBC while on heparin  drip  Renda Rolls, PharmD, [facility] 06/15/2022 6:03 AM

## 2022-06-15 NOTE — Evaluation (Signed)
Physical Therapy Evaluation Patient Details Name: Charles Jennings MRN: [id number] DOB: 12/08/1927 Today's Date: 06/15/2022  History of Present Illness  [name] is a 89 y.o.male with a gangrenous left third toe and nonpalpable left lower extremity pulses with a history of bilateral vascular surgery in the past. The patient is brought in for angiography for further evaluation and potential treatment.  Due to the limb threatening nature of the situation, angiogram was performed for attempted limb salvage.  Clinical Impression  Patient received in bed, required encouragement to participate in PT eval. Wanted to just wait until his son in law came to see if he could walk. With my and RN encouragement, patient willing to participate. He required mod A for supine to sit and to get to edge of bed. He was able to stand from elevated bed and min A. Patient then ambulated 300 feet with RW and min guard/supervision. Cues needed to stay close to RW. He will continue to benefit from skilled PT while here to improve functional independence and safety with mobility.           Recommendations for follow up therapy are one component of a multi-disciplinary discharge planning process, led by the attending physician.  Recommendations may be updated based on patient status, additional functional criteria and insurance authorization.  Follow Up Recommendations Home health PT      Assistance Recommended at Discharge Frequent or constant Supervision/Assistance  Patient can return home with the following  A little help with walking and/or transfers;A little help with bathing/dressing/bathroom;Help with stairs or ramp for entrance;Assist for transportation;Assistance with cooking/housework    Equipment Recommendations None recommended by PT  Recommendations for Other Services       Functional Status Assessment Patient has had a recent decline in their functional status and demonstrates the ability to  make significant improvements in function in a reasonable and predictable amount of time.     Precautions / Restrictions Precautions Precautions: Fall Precaution Comments: mod fall Restrictions Weight Bearing Restrictions: No      Mobility  Bed Mobility Overal bed mobility: Needs Assistance Bed Mobility: Supine to Sit     Supine to sit: Mod assist     General bed mobility comments: mod assist to scoot to edge of bed    Transfers Overall transfer level: Needs assistance Equipment used: Rolling walker (2 wheels) Transfers: Sit to/from Stand Sit to Stand: From elevated surface                Ambulation/Gait Ambulation/Gait assistance: Min assist Gait Distance (Feet): 300 Feet Assistive device: Rolling walker (2 wheels) Gait Pattern/deviations: Step-through pattern, Decreased step length - right, Decreased step length - left, Decreased weight shift to left Gait velocity: decreased     General Gait Details: patient ambulating on heel of left LE. Good tolerance. ambulated 2x around nursing station. Cues needed for staying close to [employer]    Modified Rankin (Stroke Patients Only)       Balance Overall balance assessment: Modified Independent                                           Pertinent Vitals/Pain Pain Assessment Pain Assessment: Faces Faces Pain Scale: Hurts a little bit Pain Location: low back, toe Pain Descriptors / Indicators: Discomfort, Sore  Pain Intervention(s): Monitored during session, Premedicated before session    [place] expects to be discharged to:: Private residence Living Arrangements: Alone Available Help at Discharge: Family;Friend(s);Available PRN/intermittently             Home Equipment: Conservation officer, nature (2 wheels)      Prior Function Prior Level of Function : Independent/Modified Independent             Mobility Comments: using rw at  baseline ADLs Comments: independent     Hand Dominance        Extremity/Trunk Assessment   Upper Extremity Assessment Upper Extremity Assessment: Overall WFL for tasks assessed    Lower Extremity Assessment Lower Extremity Assessment: Generalized weakness    Cervical / Trunk Assessment Cervical / Trunk Assessment: Normal  Communication   Communication: No difficulties  Cognition Arousal/Alertness: Awake/alert Behavior During Therapy: WFL for tasks assessed/performed Overall Cognitive Status: Within Functional Limits for tasks assessed                                          General Comments      Exercises     Assessment/Plan    PT Assessment Patient needs continued PT services  PT Problem List Decreased strength;Decreased activity tolerance;Decreased balance;Decreased mobility;Decreased safety awareness;Decreased skin integrity;Pain       PT Treatment Interventions DME instruction;Gait training;Therapeutic exercise;Balance training;Stair training;Functional mobility training;Patient/family education;Therapeutic activities    PT Goals (Current goals can be found in the Care Plan section)  Acute Rehab PT Goals Patient Stated Goal: to return home PT Goal Formulation: With patient Time For Goal Achievement: 06/22/22 Potential to Achieve Goals: Good    Frequency Min 2X/week     Co-evaluation               AM-PAC PT "6 Clicks" Mobility  Outcome Measure Help needed turning from your back to your side while in a flat bed without using bedrails?: A Little Help needed moving from lying on your back to sitting on the side of a flat bed without using bedrails?: A Lot Help needed moving to and from a bed to a chair (including a wheelchair)?: A Little Help needed standing up from a chair using your arms (e.g., wheelchair or bedside chair)?: A Little Help needed to walk in hospital room?: A Little Help needed climbing 3-5 steps with a railing? :  A Little 6 Click Score: 17    End of Session Equipment Utilized During Treatment: Gait belt Activity Tolerance: Patient tolerated treatment well Patient left: in bed;with call bell/phone within reach;Other (comment) (OT present in room) Nurse Communication: Mobility status PT Visit Diagnosis: Other abnormalities of gait and mobility (R26.89);Muscle weakness (generalized) (M62.81);Difficulty in walking, not elsewhere classified (R26.2);Pain Pain - Right/Left: Left Pain - part of body: Ankle and joints of foot    Time: 1950-9326 PT Time Calculation (min) (ACUTE ONLY): 26 min   Charges:   PT Evaluation $PT Eval Moderate Complexity: 1 Mod PT Treatments $Gait Training: 8-22 mins        Rashena Dowling, PT, GCS 06/15/22,11:30 AM

## 2022-06-15 NOTE — TOC Transition Note (Signed)
Transition of Care [facility]) - CM/SW Discharge Note   Patient Details  Name: Charles Jennings MRN: [id number] Date of Birth: [birth year]  Transition of Care [facility]) CM/SW Contact:  Candie Chroman, LCSW Phone Number: 06/15/2022, 11:51 AM   Clinical Narrative: Patient has orders to discharge home today. CSW met with patient. No supports at bedside. CSW introduced role and explained that therapy recommendations would be discussed. Patient declined home health. Encouraged him to contact his PCP if he changes his mind after discharge. Patient has an aide through [facility] [employer] that comes to the home once a week. Patient said they told him to let them know if he needs to increase the hours. No DME recommendations. Son-in-law will transport him home. No further concerns. CSW signing off.   Final next level of care: Home/Self Care Barriers to Discharge: No Barriers Identified   Patient Goals and CMS Choice      Discharge Placement                  Patient to be transferred to facility by: Son-in-law   Patient and family notified of of transfer: 06/15/22  Discharge Plan and Services Additional resources added to the After Visit Summary for                                       Social Determinants of Health (SDOH) Interventions SDOH Screenings   Food Insecurity: No Food Insecurity (06/12/2022)  Housing: Low Risk  (06/12/2022)  Transportation Needs: No Transportation Needs (06/12/2022)  Utilities: Not At Risk (06/12/2022)  Tobacco Use: Medium Risk (06/15/2022)     Readmission Risk Interventions     No data to display

## 2022-06-15 NOTE — Evaluation (Signed)
Occupational Therapy Evaluation Patient Details Name: Charles Jennings MRN: [id number] DOB: 02/26/1928 Today's Date: 06/15/2022   History of Present Illness Charles Jennings is a 89 y.o.male with a gangrenous left third toe and nonpalpable left lower extremity pulses with a history of bilateral vascular surgery in the past. The patient is brought in for angiography for further evaluation and potential treatment.  Due to the limb threatening nature of the situation, angiogram was performed for attempted limb salvage.   Clinical Impression   [name] presents with generalized weakness and limited endurance. He denies pain at present. He lives alone in a single-story apt, uses a rollator for ambulation, reports 1 fall in previous year. He drives, does his own shopping, and completes his BADL indly. He has assistance for housework. During today's evaluation, he is able to perform bed mobility, transfers, ambulation w/ RW, toileting, grooming, all with SUPV-Mod I. He appears close to his baseline level of fxl mobility but could benefit from short term HHOT, particularly with bathing. He reports that he has found it increasingly difficult to get into and out of his combined tub/shower unit at home. Recommended a tub bench plus home safety eval by HHOT, but pt declines, stating that he has friends and neighbors that can assist him PRN.   Recommendations for follow up therapy are one component of a multi-disciplinary discharge planning process, led by the attending physician.  Recommendations may be updated based on patient status, additional functional criteria and insurance authorization.   Follow Up Recommendations  Home health OT     Assistance Recommended at Discharge PRN  Patient can return home with the following A little help with bathing/dressing/bathroom;Assistance with cooking/housework    Functional Status Assessment     Equipment Recommendations  Tub/shower bench     Recommendations for Other Services       Precautions / Restrictions Precautions Precautions: Fall Precaution Comments: mod fall Restrictions Weight Bearing Restrictions: No      Mobility Bed Mobility Overal bed mobility: Needs Assistance Bed Mobility: Sit to Supine     Supine to sit: Modified independent (Device/Increase time)     General bed mobility comments: increased time/effort    Transfers Overall transfer level: Needs assistance Equipment used: Rolling walker (2 wheels) Transfers: Sit to/from Stand Sit to Stand: Supervision                  Balance Overall balance assessment: Modified Independent                                         ADL either performed or assessed with clinical judgement   ADL Overall ADL's : Needs assistance/impaired Eating/Feeding: Independent   Grooming: Wash/dry hands;Standing;Supervision/safety           Upper Body Dressing : Supervision/safety;Sitting       Toilet Transfer: Supervision/safety;Rolling walker (2 wheels);Comfort height toilet   Toileting- Clothing Manipulation and Hygiene: Modified independent;Sit to/from stand               Vision         Perception     Praxis      Pertinent Vitals/Pain Pain Assessment Pain Assessment: No/denies pain     Hand Dominance     Extremity/Trunk Assessment Upper Extremity Assessment Upper Extremity Assessment: Overall WFL for tasks assessed   Lower Extremity Assessment Lower Extremity Assessment: Overall WFL for tasks  assessed   Cervical / Trunk Assessment Cervical / Trunk Assessment: Normal   Communication Communication Communication: No difficulties   Cognition Arousal/Alertness: Awake/alert   Overall Cognitive Status: Within Functional Limits for tasks assessed                                       General Comments       Exercises Other Exercises Other Exercises: Educ re: continued mobility, home  safety, DC recs   Shoulder Instructions      Home Living Family/patient expects to be discharged to:: Private residence Living Arrangements: Alone Available Help at Discharge: Family;Friend(s);Available PRN/intermittently Type of Home: Apartment Home Access: Level entry     Home Layout: One level     Bathroom Shower/Tub: Teacher, early years/pre: Handicapped height     Home Equipment: Rollator (4 wheels)          Prior Functioning/Environment Prior Level of Function : Independent/Modified Independent             Mobility Comments: using rollater ADLs Comments: drives, shops, performs all BADL. Has assistance for housework        OT Problem List: Decreased activity tolerance;Decreased strength      OT Treatment/Interventions:      OT Goals(Current goals can be found in the care plan section) Acute Rehab OT Goals Patient Stated Goal: to start going to church again OT Goal Formulation: With patient Time For Goal Achievement: 06/29/22 Potential to Achieve Goals: Good  OT Frequency:      Co-evaluation              AM-PAC OT "6 Clicks" Daily Activity     Outcome Measure Help from another person eating meals?: None Help from another person taking care of personal grooming?: None Help from another person toileting, which includes using toliet, bedpan, or urinal?: A Little Help from another person bathing (including washing, rinsing, drying)?: A Little Help from another person to put on and taking off regular upper body clothing?: None Help from another person to put on and taking off regular lower body clothing?: A Little 6 Click Score: 21   End of Session Equipment Utilized During Treatment: Rolling walker (2 wheels)  Activity Tolerance: Patient tolerated treatment well Patient left: in bed;with nursing/sitter in room;with call bell/phone within reach  OT Visit Diagnosis: Muscle weakness (generalized) (M62.81)                Time:  9470-9628 OT Time Calculation (min): 28 min Charges:  OT General Charges $OT Visit: 1 Visit OT Treatments $Self Care/Home Management : 23-37 mins Josiah Lobo, PhD, MS, OTR/L 06/15/22, 1:34 PM

## 2022-06-16 LAB — HEMOGLOBIN A1C
Hgb A1c MFr Bld: 7.7 % — ABNORMAL HIGH (ref 4.8–5.6)
Mean Plasma Glucose: 174 mg/dL

## 2022-06-21 ENCOUNTER — Telehealth (INDEPENDENT_AMBULATORY_CARE_PROVIDER_SITE_OTHER): Payer: Self-pay

## 2022-06-21 NOTE — Telephone Encounter (Signed)
April informed Charles Jennings of information.

## 2022-06-21 NOTE — Telephone Encounter (Signed)
Sharyn Lull with [facility] home [facility] is concerned with patients discharge instructions or lack of.  Patient has bilateral groin wounds and a necrotic toe.  Patient also needs to schedule a follow up appointment.  Please advise.

## 2022-06-22 ENCOUNTER — Other Ambulatory Visit: Payer: Self-pay | Admitting: Podiatry

## 2022-06-27 ENCOUNTER — Other Ambulatory Visit: Payer: Self-pay

## 2022-06-27 ENCOUNTER — Encounter: Payer: Self-pay | Admitting: Podiatry

## 2022-06-27 ENCOUNTER — Encounter
Admission: RE | Admit: 2022-06-27 | Discharge: 2022-06-27 | Disposition: A | Discharge status: Home / Self Care | Payer: Medicare Other | Source: Ambulatory Visit | Attending: Podiatry | Admitting: Podiatry

## 2022-06-27 DIAGNOSIS — Z01812 Encounter for preprocedural laboratory examination: Secondary | ICD-10-CM

## 2022-06-27 HISTORY — DX: Peripheral vascular disease, unspecified: I73.9

## 2022-06-27 HISTORY — DX: Unspecified osteoarthritis, unspecified site: M19.90

## 2022-06-27 NOTE — Pre-Procedure Instructions (Signed)
Patient made aware that he needed a EKG preferably before his surgery, patient stated that he would not be able to come in to get the EKG done before his surgery due to pain and the difficulty of driving due to medical issues with his feet. Patient made aware that we can do the EKG on the day of surgery but if it is found to be unsafe for him to have surgery the surgery would be cancelled.

## 2022-06-27 NOTE — Patient Instructions (Addendum)
Your procedure is scheduled on: 06/30/22 - Friday Report to the Registration Desk on the 1st floor of the [facility]. To find out your arrival time, please call [phone] between 1PM - 3PM on: 06/29/22 - Thursday If your arrival time is 6:00 am, do not arrive prior to that time as the [facility] entrance doors do not open until 6:00 am.  REMEMBER: Instructions that are not followed completely may result in serious medical risk, up to and including death; or upon the discretion of your surgeon and anesthesiologist your surgery may need to be rescheduled.  Do not eat food after midnight the night before surgery.  No gum chewing, lozengers or hard candies.  You may however, drink CLEAR liquids up to 2 hours before you are scheduled to arrive for your surgery. Do not drink anything within 2 hours of your scheduled arrival time.  Type 1 and Type 2 diabetics should only drink water.  In addition, your doctor has ordered for you to drink the provided  Gatorade G2 Drinking this carbohydrate drink up to two hours before surgery helps to reduce insulin resistance and improve patient outcomes. Please complete drinking 2 hours prior to scheduled arrival time.  TAKE THESE MEDICATIONS THE MORNING OF SURGERY WITH A SIP OF WATER:  - diltiazem (CARDIZEM CD)   HOLD JARDIANCE 3 days prior to your surgery beginning 06/27/22.  HOLD metFORMIN (GLUCOPHAGE) beginning 06/28/22.  HOLD clopidogrel (PLAVIX) beginning 06/25/22, may resume taking with doctor order.  HOLD NOVOLIN 70/30 on the morning of surgery , may resume with meals.  One week prior to surgery: Stop Anti-inflammatories (NSAIDS) such as Advil, Aleve, Ibuprofen, Motrin, Naproxen, Naprosyn and Aspirin based products such as Excedrin, Goodys Powder, BC Powder.  Stop ANY OVER THE COUNTER supplements until after surgery.  You may however, continue to take Tylenol if needed for pain up until the day of surgery.  No Alcohol for 24 hours  before or after surgery.  No Smoking including e-cigarettes for 24 hours prior to surgery.  No chewable tobacco products for at least 6 hours prior to surgery.  No nicotine patches on the day of surgery.  Do not use any "recreational" drugs for at least a week prior to your surgery.  Please be advised that the combination of cocaine and anesthesia may have negative outcomes, up to and including death. If you test positive for cocaine, your surgery will be cancelled.  On the morning of surgery brush your teeth with toothpaste and water, you may rinse your mouth with mouthwash if you wish. Do not swallow any toothpaste or mouthwash.  Do not wear jewelry, make-up, hairpins, clips or nail polish.  Do not wear lotions, powders, or perfumes.   Do not shave body from the neck down 48 hours prior to surgery just in case you cut yourself which could leave a site for infection.  Also, freshly shaved skin may become irritated if using the CHG soap.  Contact lenses, hearing aids and dentures may not be worn into surgery.  Do not bring valuables to the hospital. [facility] is not responsible for any missing/lost belongings or valuables.   Notify your doctor if there is any change in your medical condition (cold, fever, infection).  Wear comfortable clothing (specific to your surgery type) to the hospital.  After surgery, you can help prevent lung complications by doing breathing exercises.  Take deep breaths and cough every 1-2 hours. Your doctor may order a device called an Chiropodist to  help you take deep breaths. When coughing or sneezing, hold a pillow firmly against your incision with both hands. This is called "splinting." Doing this helps protect your incision. It also decreases belly discomfort.  If you are being admitted to the hospital overnight, leave your suitcase in the car. After surgery it may be brought to your room.  If you are being discharged the day of surgery, you  will not be allowed to drive home. You will need a responsible adult (18 years or older) to drive you home and stay with you that night.   If you are taking public transportation, you will need to have a responsible adult (18 years or older) with you. Please confirm with your physician that it is acceptable to use public transportation.   Please call the [facility] at [phone] if you have any questions about these instructions.  Surgery Visitation Policy:  Patients undergoing a surgery or procedure may have two family members or support persons with them as long as the person is not COVID-19 positive or experiencing its symptoms.   Inpatient Visitation:    Visiting hours are 7 a.m. to 8 p.m. Up to four visitors are allowed at one time in a patient room. The visitors may rotate out with other people during the day. One designated support person (adult) may remain overnight.  Due to an increase in RSV and influenza rates and associated hospitalizations, children ages 39 and under will not be able to visit patients in [facility]. Masks continue to be strongly recommended.

## 2022-06-28 NOTE — Progress Notes (Signed)
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 Clinical nursing staff has updated patient's PMH/PSHx, current medication list, and drug allergies/intolerances to ensure comprehensive history available to assist in medical decision making as it pertains to the aforementioned surgical procedure and anticipated anesthetic course. Extensive review of available clinical information performed. [place] PMH and PSHx updated with any diagnoses/procedures that  may have been inadvertently omitted during his intake with the pre-admission testing department's nursing staff.  Clinical Discussion:  Charles Jennings is a 89 y.o. male who is submitted for pre-surgical anesthesia review and clearance prior to him undergoing the above procedure.Patient is a Former Research scientist (life sciences). Pertinent PMH includes: HFpEF, CHB (s/p PPM placement), frequent PVCs, PVD, SDH, HTN, HLD, T2DM, OA.  Patient is followed by cardiology Nehemiah Massed, MD). He was last seen in the cardiology clinic on 05/10/2022; notes reviewed.  At the time of his last clinic visit, patient doing well overall from a cardiovascular perspective.  He denied any episodes of chest pain, shortness of breath, PND, orthopnea, palpitations, significant peripheral edema, vertiginous symptoms, weakness, fatigue, or presyncope/syncope.  Patient with a  past medical history significant for cardiovascular diagnoses.  Most recent TTE was performed on 01/30/2016 revealing a low normal left ventricular systolic function with an EF of 50-55%.  Left atrium mildly dilated. Left ventricular diastolic Doppler parameters consistent with abnormal relaxation (G1DD).  There was mild mitral annular calcification and aortic valve sclerosis noted.  Right ventricular size and function normal.  All transvalvular gradients were noted to be normal with no evidence suggestive of valvular stenosis.  Patient with a history of complete heart block requiring placement of a dual-chamber Medtronic Advisa PPM on 01/31/2016.  Device is regularly interrogated by patient's primary cardiology/electrophysiology team.  Given patient's PVD, he remains on daily DAPT therapy (ASA + clopidogrel).  Patient reportedly compliant with therapy with no evidence or reports of GI bleeding.  Blood pressure elevated at 170/80 mmHg on prescribed CCB (diltiazem) and ACEi (lisinopril) therapies.  Of note, patient mentions that blood pressure is often elevated at medical appointments (whitecoat hypertension). Patient is on atorvastatin for his HLD diagnosis and further ASCVD prevention.  T2DM noted to be reasonably controlled on currently prescribed regimen; last HgbA1c was 7.7% when checked on 06/14/2022. Patient does not have an OSAH diagnosis.  Patient reported to maintain an exceptionally active lifestyle.  Patient reported that he walks in excess of 5 miles per day, however recent operations on his foot has limited his mobility.  Patient felt to be able to achieve in excess of 4 METS of physical activity without experiencing any degree of angina/anginal equivalent symptoms.  No changes were made to his medication regimen.  Patient to follow-up with outpatient cardiology and 1 year or sooner if needed.  Charles Jennings is scheduled for an AMPUTATION TOE - METATARSOPHALANGEAL JOINT -  THIRD (Left: Toe)  on 06/30/2022 with Dr. Sharlotte Alamo DPM.  Given patient's past medical history significant for cardiovascular diagnoses, presurgical cardiac clearance was sought by the PAT team. Per cardiology, "this patient is optimized for surgery and may proceed with the planned procedural course with an ACCEPTABLE risk of significant perioperative cardiovascular complications". Again, this patient is on daily antiplatelet therapy.  He has been instructed on recommendations from his surgeon for holding his clopidogrel dose for 5 days prior to his procedure with plans to restart since postoperatively respectively minimized by his primary attending surgeon.  Patient is aware that his last dose of clopidogrel should be on 06/24/2022.  Given his cardiovascular history, patient we will remain on his daily low-dose ASA throughout his perioperative course.  Patient denies previous perioperative complications with anesthesia in the past.  In review his EMR, there are no records available for review pertaining to past procedural/anesthetic courses within the [facility] system.     06/15/2022    8:05 AM 06/14/2022    7:36 PM 06/14/2022    4:29 PM  Vitals with BMI  Systolic [phone]  Diastolic 44 45 49  Pulse 72 71 77    Providers/Specialists:   NOTE: Primary physician provider listed below. Patient may have been seen by APP or partner within same practice.   PROVIDER ROLE / SPECIALTY LAST Merry Proud, DPM Podiatry (Surgeon) 06/22/2022  Albina Billet, MD Primary Care Provider  ???  Serafina Royals, MD Cardiology 05/10/2022   Allergies:  Patient has no known allergies.  Current Home Medications:   No current facility-administered medications for this encounter.    ASPIR-LOW 81 MG EC tablet   atorvastatin (LIPITOR) 40 MG tablet   clopidogrel (PLAVIX) 75 MG tablet   diltiazem (CARDIZEM CD) 240 MG 24 hr capsule   gabapentin (NEURONTIN) 300 MG capsule   HYDROcodone-acetaminophen (NORCO/VICODIN) 5-325 MG  tablet   JARDIANCE 10 MG TABS tablet   lisinopril (ZESTRIL) 40 MG tablet   loratadine (CLARITIN) 10 MG tablet   metFORMIN (GLUCOPHAGE) 500 MG tablet   NOVOLIN 70/30 (70-30) 100 UNIT/ML injection   prednisoLONE acetate (PRED FORTE) 1 % ophthalmic suspension   History:   Past Medical History:  Diagnosis Date   (HFpEF) heart failure with preserved ejection fraction (HCC)    Arthritis    Complete heart block (HCC)    a.) s/p dual chamber Medtronic PPM placement 01/31/2016   Frequent PVCs    Hemorrhoids    a.) s/p banding 01/2021   HLD (hyperlipidemia)    Hypertension    Long term current use of antithrombotics/antiplatelets    a.) on DAPT (ASA + clopidogrel)   Peripheral vascular disease ([place])    Presence of permanent cardiac pacemaker 01/31/2016   a.) s/p Advisa DR MRI SureScan dual chamber PPM (SN: [id number])   SDH (subdural hematoma) (HCC)    Skin cancer    Type 2 diabetes mellitus treated with insulin ([place])    Past Surgical History:  Procedure Laterality Date   EYE SURGERY     FEMORAL ENDARTERECTOMY Bilateral    HEMORRHOID BANDING N/A 01/2021   HERNIA REPAIR     double hernia   LOWER EXTREMITY ANGIOGRAPHY Left 06/12/2022   Procedure: Lower Extremity Angiography;  Surgeon: Algernon Huxley, MD;  Location: [place] [facility];  Service: Cardiovascular;  Laterality: Left;   LOWER EXTREMITY INTERVENTION Bilateral 06/14/2022   Procedure: LOWER EXTREMITY INTERVENTION;  Surgeon: Algernon Huxley, MD;  Location: [place]  CV LAB;  Service: Cardiovascular;  Laterality: Bilateral;   PACEMAKER INSERTION Left 01/31/2016   Procedure: PACEMAKER INSERTION; Location: [facility]; Surgeon: Dimas Alexandria, MD   No family history on file. Social History   Tobacco Use   Smoking status: Former    Types: Cigarettes   Smokeless tobacco: Never  Substance Use Topics   Alcohol use: Not Currently   Drug use: Never    Pertinent Clinical Results:  LABS: Labs reviewed: Acceptable for surgery.  Lab  Results  Component Value Date   WBC 7.1 06/15/2022   HGB 13.2 06/15/2022   HCT 39.9 06/15/2022   MCV 89.9 06/15/2022   PLT 149 (L) 06/15/2022   Lab Results  Component Value Date   NA 131 (L) 06/15/2022   K 4.7 06/15/2022   CO2 21 (L) 06/15/2022   GLUCOSE 189 (H) 06/15/2022   BUN 33 (H) 06/15/2022   CREATININE 1.15 06/15/2022   CALCIUM 8.2 (L) 06/15/2022   GFRNONAA 60 (L) 06/15/2022   Lab Results  Component Value Date   HGBA1C 7.7 (H) 06/14/2022    ECG: Date: 05/05/2021 Time ECG obtained: 1118 AM Rate: 78 bpm Rhythm:  Atrial sensed ventricular paced Axis (leads I and aVF): Normal Intervals: PR 184 ms. QRS 158 ms. QTc 494 ms. ST segment and T wave changes: No evidence of acute ST segment elevation or depression Comparison: Similar to previous tracing obtained on 04/23/2019 NOTE: Tracing obtained at [facility]; unable for review. Above based on cardiologist's interpretation.    IMAGING / PROCEDURES: CT FOOT LEFT WO CONTRAST performed on 06/11/2022 No CT findings to suggest osteomyelitis. If high clinical concern, please consider MRI for further evaluation (with intravenous contrast if GFR greater than 30). No acute displaced fracture or dislocation. Diffusely decreased bone density.  DG FOOT COMPLETE LEFT performed on 06/11/2022 Bones are diffusely osteopenic.  No evidence for fracture, dislocation or cortical destruction.  There are mild degenerative changes of the second distal interphalangeal joint.  TRANSTHORACIC ECHOCARDIOGRAM performed on 01/30/2016 Overall normal left ventricular systolic function, LVEF 23-30%  Diastolic dysfunction - grade I (normal filling pressures)  Dilated left atrium - mild  Mitral annular calcification  Aortic sclerosis  Normal right ventricular systolic function   Impression and Plan:  Charles Jennings has been referred for pre-anesthesia review and clearance prior to him undergoing the planned anesthetic and procedural  courses. Available labs, pertinent testing, and imaging results were personally reviewed by me. This patient has been appropriately cleared by cardiology with an overall ACCEPTABLE risk of significant perioperative cardiovascular complications. Completed perioperative prescription for cardiac device management documentation completed by primary cardiology team and placed on patient's chart for review by the surgical/anesthetic team on the day of his procedure. Electrophysiology indicating that procedure should not interfere with planned surgical procedure. Beyond normal perioperative cardiovascular monitoring, there are no recommendations from electrophysiology team that prompt further discussion/recommendations from industry representative.  Patient intervention  Patient refused to come in for preoperative ECG citing that he was in pain, he didn't drive well, and that there was nothing wrong with his heart. Patient will require ECG to be performed on the day of surgery. He is aware that any abnormalities may necessitate the cancellation of his case and subsequent follow up with his cardiologist.   Based on clinical review performed today (06/28/22), barring any significant acute changes in the patient's overall condition, it is anticipated that he will be able to proceed with the planned surgical intervention. Any acute changes in clinical condition  may necessitate his procedure being postponed and/or cancelled. Patient will meet with anesthesia team (MD and/or CRNA) on the day of his procedure for preoperative evaluation/assessment. Questions regarding anesthetic course will be fielded at that time.   Pre-surgical instructions were reviewed with the patient during his PAT appointment and questions were fielded by PAT clinical staff. Patient was advised that if any questions or concerns arise prior to his procedure then he should return a call to PAT and/or his surgeon's office to discuss.  Honor Loh, MSN,  APRN, FNP-C, CEN [facility]  Peri-operative Services Nurse Practitioner Phone: [phone] Fax: [phone] 06/28/22 2:35 PM  NOTE: This note has been prepared using Dragon dictation software. Despite my best ability to proofread, there is always the potential that unintentional transcriptional errors may still occur from this process.

## 2022-06-29 MED ORDER — ORAL CARE MOUTH RINSE: 15.0000 mL | Freq: Once | OROMUCOSAL | Status: AC

## 2022-06-29 MED ORDER — CHLORHEXIDINE GLUCONATE 0.12 % MT SOLN
15.0000 mL | Freq: Once | OROMUCOSAL | Status: AC
  Administered 2022-06-30: 15 mL via OROMUCOSAL

## 2022-06-29 MED ORDER — CEFAZOLIN SODIUM-DEXTROSE 2-4 GM/100ML-% IV SOLN
2.0000 g | INTRAVENOUS | Status: AC
  Administered 2022-06-30: 2 g via INTRAVENOUS

## 2022-06-29 MED ORDER — SODIUM CHLORIDE 0.9 % IV SOLN: INTRAVENOUS | Status: DC

## 2022-06-30 ENCOUNTER — Ambulatory Visit: Payer: Medicare Other | Admitting: Urgent Care

## 2022-06-30 ENCOUNTER — Ambulatory Visit
Admission: RE | Admit: 2022-06-30 | Discharge: 2022-06-30 | Disposition: A | Discharge status: Home / Self Care | Payer: Medicare Other | Attending: Podiatry | Admitting: Podiatry

## 2022-06-30 ENCOUNTER — Other Ambulatory Visit: Payer: Self-pay

## 2022-06-30 ENCOUNTER — Encounter: Payer: Self-pay | Admitting: Podiatry

## 2022-06-30 ENCOUNTER — Encounter
Admission: RE | Disposition: A | Discharge status: Home / Self Care | Payer: Self-pay | Source: Home / Self Care | Attending: Podiatry

## 2022-06-30 DIAGNOSIS — I5030 Unspecified diastolic (congestive) heart failure: Secondary | ICD-10-CM | POA: Diagnosis not present

## 2022-06-30 DIAGNOSIS — I442 Atrioventricular block, complete: Secondary | ICD-10-CM | POA: Diagnosis not present

## 2022-06-30 DIAGNOSIS — I110 Hypertensive heart disease with heart failure: Secondary | ICD-10-CM | POA: Insufficient documentation

## 2022-06-30 DIAGNOSIS — Z01810 Encounter for preprocedural cardiovascular examination: Secondary | ICD-10-CM

## 2022-06-30 DIAGNOSIS — Z7902 Long term (current) use of antithrombotics/antiplatelets: Secondary | ICD-10-CM | POA: Insufficient documentation

## 2022-06-30 DIAGNOSIS — E1169 Type 2 diabetes mellitus with other specified complication: Secondary | ICD-10-CM | POA: Insufficient documentation

## 2022-06-30 DIAGNOSIS — M869 Osteomyelitis, unspecified: Secondary | ICD-10-CM | POA: Insufficient documentation

## 2022-06-30 DIAGNOSIS — E785 Hyperlipidemia, unspecified: Secondary | ICD-10-CM | POA: Diagnosis not present

## 2022-06-30 DIAGNOSIS — Z01812 Encounter for preprocedural laboratory examination: Secondary | ICD-10-CM

## 2022-06-30 DIAGNOSIS — Z950 Presence of cardiac pacemaker: Secondary | ICD-10-CM | POA: Insufficient documentation

## 2022-06-30 DIAGNOSIS — Z87891 Personal history of nicotine dependence: Secondary | ICD-10-CM | POA: Diagnosis not present

## 2022-06-30 DIAGNOSIS — E1152 Type 2 diabetes mellitus with diabetic peripheral angiopathy with gangrene: Secondary | ICD-10-CM | POA: Diagnosis present

## 2022-06-30 HISTORY — DX: Unspecified malignant neoplasm of skin, unspecified: C44.90

## 2022-06-30 HISTORY — DX: Unspecified diastolic (congestive) heart failure: I50.30

## 2022-06-30 HISTORY — PX: AMPUTATION TOE: SHX6595

## 2022-06-30 HISTORY — DX: Long term (current) use of insulin: Z79.4

## 2022-06-30 HISTORY — DX: Hyperlipidemia, unspecified: E78.5

## 2022-06-30 HISTORY — DX: Atrioventricular block, complete: I44.2

## 2022-06-30 HISTORY — DX: Type 2 diabetes mellitus without complications: E11.9

## 2022-06-30 HISTORY — DX: Traumatic subdural hemorrhage with loss of consciousness status unknown, initial encounter: S06.5XAA

## 2022-06-30 HISTORY — DX: Long term (current) use of antithrombotics/antiplatelets: Z79.02

## 2022-06-30 HISTORY — DX: Ventricular premature depolarization: I49.3

## 2022-06-30 HISTORY — DX: Unspecified hemorrhoids: K64.9

## 2022-06-30 LAB — GLUCOSE, CAPILLARY
Glucose-Capillary: 166 mg/dL — ABNORMAL HIGH (ref 70–99)
Glucose-Capillary: 163 mg/dL — ABNORMAL HIGH (ref 70–99)

## 2022-06-30 SURGERY — AMPUTATION, TOE
Anesthesia: General | Site: Toe | Laterality: Left

## 2022-06-30 MED ORDER — FENTANYL CITRATE (PF) 100 MCG/2ML IJ SOLN
INTRAMUSCULAR | Status: AC
  Filled 2022-06-30: qty 2

## 2022-06-30 MED ORDER — PROPOFOL 10 MG/ML IV BOLUS
INTRAVENOUS | Status: AC
  Filled 2022-06-30: qty 40

## 2022-06-30 MED ORDER — ACETAMINOPHEN 10 MG/ML IV SOLN: 1000.0000 mg | Freq: Once | INTRAVENOUS | Status: DC | PRN

## 2022-06-30 MED ORDER — OXYCODONE HCL 5 MG PO TABS
ORAL_TABLET | ORAL | Status: AC
  Filled 2022-06-30: qty 1

## 2022-06-30 MED ORDER — KETAMINE HCL 50 MG/5ML IJ SOSY
PREFILLED_SYRINGE | INTRAMUSCULAR | Status: AC
  Filled 2022-06-30: qty 5

## 2022-06-30 MED ORDER — DROPERIDOL 2.5 MG/ML IJ SOLN: 0.6250 mg | Freq: Once | INTRAMUSCULAR | Status: DC | PRN

## 2022-06-30 MED ORDER — FENTANYL CITRATE (PF) 100 MCG/2ML IJ SOLN: 25.0000 ug | INTRAMUSCULAR | Status: DC | PRN

## 2022-06-30 MED ORDER — 0.9 % SODIUM CHLORIDE (POUR BTL) OPTIME
TOPICAL | Status: DC | PRN
  Administered 2022-06-30: 500 mL

## 2022-06-30 MED ORDER — PHENYLEPHRINE 80 MCG/ML (10ML) SYRINGE FOR IV PUSH (FOR BLOOD PRESSURE SUPPORT)
PREFILLED_SYRINGE | INTRAVENOUS | Status: DC | PRN
  Administered 2022-06-30: 120 ug via INTRAVENOUS

## 2022-06-30 MED ORDER — CEFAZOLIN SODIUM-DEXTROSE 2-4 GM/100ML-% IV SOLN
INTRAVENOUS | Status: AC
  Filled 2022-06-30: qty 100

## 2022-06-30 MED ORDER — PROPOFOL 500 MG/50ML IV EMUL
INTRAVENOUS | Status: DC | PRN
  Administered 2022-06-30: 60 ug/kg/min via INTRAVENOUS

## 2022-06-30 MED ORDER — OXYCODONE HCL 5 MG PO TABS
5.0000 mg | ORAL_TABLET | Freq: Once | ORAL | Status: AC | PRN
  Administered 2022-06-30: 5 mg via ORAL

## 2022-06-30 MED ORDER — OXYCODONE HCL 5 MG/5ML PO SOLN: 5.0000 mg | Freq: Once | ORAL | Status: AC | PRN

## 2022-06-30 MED ORDER — BUPIVACAINE HCL (PF) 0.5 % IJ SOLN
INTRAMUSCULAR | Status: AC
  Filled 2022-06-30: qty 30

## 2022-06-30 MED ORDER — KETAMINE HCL 10 MG/ML IJ SOLN
INTRAMUSCULAR | Status: DC | PRN
  Administered 2022-06-30: 20 mg via INTRAVENOUS

## 2022-06-30 MED ORDER — FENTANYL CITRATE (PF) 100 MCG/2ML IJ SOLN
INTRAMUSCULAR | Status: DC | PRN
  Administered 2022-06-30 (×2): 25 ug via INTRAVENOUS

## 2022-06-30 MED ORDER — BUPIVACAINE HCL 0.5 % IJ SOLN
INTRAMUSCULAR | Status: DC | PRN
  Administered 2022-06-30: 10 mL

## 2022-06-30 MED ORDER — FAMOTIDINE 20 MG PO TABS
ORAL_TABLET | ORAL | Status: AC
  Filled 2022-06-30: qty 1

## 2022-06-30 MED ORDER — PROMETHAZINE HCL 25 MG/ML IJ SOLN: 6.2500 mg | INTRAMUSCULAR | Status: DC | PRN

## 2022-06-30 SURGICAL SUPPLY — 37 items
BLADE SURG 15 STRL LF DISP TIS (BLADE) ×2 IMPLANT
BLADE SURG 15 STRL SS (BLADE) ×2
BLADE SURG MINI STRL (BLADE) ×1 IMPLANT
BNDG ELASTIC 4X5.8 VLCR STR LF (GAUZE/BANDAGES/DRESSINGS) IMPLANT
BNDG ESMARCH 4 X 12 STRL LF (GAUZE/BANDAGES/DRESSINGS) ×1
BNDG ESMARCH 4X12 STRL LF (GAUZE/BANDAGES/DRESSINGS) ×1 IMPLANT
BNDG GAUZE DERMACEA FLUFF 4 (GAUZE/BANDAGES/DRESSINGS) IMPLANT
DRSG XEROFORM 1X8 (GAUZE/BANDAGES/DRESSINGS) IMPLANT
DURAPREP 26ML APPLICATOR (WOUND CARE) ×1 IMPLANT
ELECT REM PT RETURN 9FT ADLT (ELECTROSURGICAL) ×1
ELECTRODE REM PT RTRN 9FT ADLT (ELECTROSURGICAL) ×1 IMPLANT
GAUZE SPONGE 4X4 12PLY STRL (GAUZE/BANDAGES/DRESSINGS) IMPLANT
GAUZE STRETCH 2X75IN STRL (MISCELLANEOUS) ×1 IMPLANT
GLOVE BIO SURGEON STRL SZ7.5 (GLOVE) ×1 IMPLANT
GLOVE SURG UNDER LTX SZ8 (GLOVE) ×1 IMPLANT
GOWN STRL REUS W/ TWL LRG LVL3 (GOWN DISPOSABLE) ×2 IMPLANT
GOWN STRL REUS W/TWL LRG LVL3 (GOWN DISPOSABLE) ×2
KIT TURNOVER KIT A (KITS) ×1 IMPLANT
LABEL OR SOLS (LABEL) ×1 IMPLANT
MANIFOLD NEPTUNE II (INSTRUMENTS) ×1 IMPLANT
NDL FILTER BLUNT 18X1 1/2 (NEEDLE) ×1 IMPLANT
NDL HYPO 25X1 1.5 SAFETY (NEEDLE) ×2 IMPLANT
NEEDLE FILTER BLUNT 18X1 1/2 (NEEDLE) ×1 IMPLANT
NEEDLE HYPO 25X1 1.5 SAFETY (NEEDLE) ×2 IMPLANT
NS IRRIG 500ML POUR BTL (IV SOLUTION) ×1 IMPLANT
PACK EXTREMITY ARMC (MISCELLANEOUS) ×1 IMPLANT
SOL PREP PVP 2OZ (MISCELLANEOUS) ×1
SOLUTION PREP PVP 2OZ (MISCELLANEOUS) ×1 IMPLANT
STOCKINETTE STRL 6IN 960660 (GAUZE/BANDAGES/DRESSINGS) ×1 IMPLANT
SUT ETHILON 3-0 FS-10 30 BLK (SUTURE) ×1
SUT ETHILON 4-0 (SUTURE)
SUT ETHILON 4-0 FS2 18XMFL BLK (SUTURE)
SUTURE EHLN 3-0 FS-10 30 BLK (SUTURE) ×1 IMPLANT
SUTURE ETHLN 4-0 FS2 18XMF BLK (SUTURE) IMPLANT
SYR 10ML LL (SYRINGE) ×1 IMPLANT
TRAP FLUID SMOKE EVACUATOR (MISCELLANEOUS) ×1 IMPLANT
WATER STERILE IRR 500ML POUR (IV SOLUTION) ×1 IMPLANT

## 2022-06-30 NOTE — Interval H&P Note (Signed)
History and Physical Interval Note:  06/30/2022 7:21 AM  Charles Jennings  has presented today for surgery, with the diagnosis of E11.49 - Type 2 diabetes mellitus with other nneurological complication R15.4 - Peripheral arterial disease I96 - Gangrene.  The various methods of treatment have been discussed with the patient and family. After consideration of risks, benefits and other options for treatment, the patient has consented to  Procedure(s): AMPUTATION TOE - [place] (Left) as a surgical intervention.  [name] history has been reviewed, patient examined, no change in status, stable for surgery.  I have reviewed the patient's chart and labs.  Questions were answered to the patient's satisfaction.     Durward Fortes

## 2022-06-30 NOTE — Transfer of Care (Signed)
Immediate Anesthesia Transfer of Care Note  Patient: Charles Jennings  Procedure(s) Performed: AMPUTATION TOE - METATRSOPHALANGEAL JOINT - THIRD (Left: Toe)  Patient Location: PACU  Anesthesia Type:General  Level of Consciousness: awake and alert   Airway & Oxygen Therapy: Patient Spontanous Breathing and Patient connected to face mask oxygen  Post-op Assessment: Report given to RN and Post -op Vital signs reviewed and stable  Post vital signs: Reviewed and stable  Last Vitals:  Vitals Value Taken Time  BP 144/69 06/30/22 0810  Temp    Pulse 71 06/30/22 0811  Resp 11 06/30/22 0811  SpO2 96 % 06/30/22 0811  Vitals shown include unvalidated device data.  Last Pain:  Vitals:   06/30/22 9163  TempSrc: Tympanic  PainSc: 0-No pain      Patients Stated Pain Goal: 0 (84/66/59 9357)  Complications: No notable events documented.

## 2022-06-30 NOTE — Anesthesia Preprocedure Evaluation (Addendum)
Anesthesia Evaluation  Patient identified by MRN, date of birth, ID band Patient awake    Reviewed: Allergy & Precautions, H&P , NPO status , Patient's Chart, lab work & pertinent test results  Airway Mallampati: II  TM Distance: >3 FB Neck ROM: full    Dental  (+) Edentulous Upper, Edentulous Lower   Pulmonary former smoker   Pulmonary exam normal        Cardiovascular hypertension, + Peripheral Vascular Disease  + dysrhythmias (complete heart block s/p ppm) + pacemaker  Rhythm:Regular Rate:Normal     Neuro/Psych H/o SDH  negative psych ROS   GI/Hepatic negative GI ROS, Neg liver ROS,,,  Endo/Other  diabetes, Type 2    Renal/GU Renal InsufficiencyRenal disease  negative genitourinary   Musculoskeletal   Abdominal  (+) + obese  Peds  Hematology negative hematology ROS (+)   Anesthesia Other Findings Past Medical History: No date: (HFpEF) heart failure with preserved ejection fraction (HCC) No date: Arthritis No date: Complete heart block ([place])     Comment:  a.) s/p dual chamber Medtronic PPM placement 01/31/2016 No date: Frequent PVCs No date: Hemorrhoids     Comment:  a.) s/p banding 01/2021 No date: HLD (hyperlipidemia) No date: Hypertension No date: Long term current use of antithrombotics/antiplatelets     Comment:  a.) on DAPT (ASA + clopidogrel) No date: Peripheral vascular disease ([place]) 01/31/2016: Presence of permanent cardiac pacemaker     Comment:  a.) s/p Advisa DR MRI SureScan dual chamber PPM (SN:               [id number]) No date: SDH (subdural hematoma) (HCC) No date: Skin cancer No date: Type 2 diabetes mellitus treated with insulin ([place])  Past Surgical History: No date: EYE SURGERY No date: FEMORAL ENDARTERECTOMY; Bilateral 01/2021: HEMORRHOID BANDING; N/A No date: HERNIA REPAIR     Comment:  double hernia 06/12/2022: LOWER EXTREMITY ANGIOGRAPHY; Left     Comment:  Procedure: Lower  Extremity Angiography;  Surgeon: Algernon Huxley, MD;  Location: [place] [facility];  Service:               Cardiovascular;  Laterality: Left; 06/14/2022: LOWER EXTREMITY INTERVENTION; Bilateral     Comment:  Procedure: LOWER EXTREMITY INTERVENTION;  Surgeon: Algernon Huxley, MD;  Location: [place] [facility];  Service:               Cardiovascular;  Laterality: Bilateral; 01/31/2016: PACEMAKER INSERTION; Left     Comment:  Procedure: PACEMAKER INSERTION; Location: [facility]; Surgeon:               Dimas Alexandria, MD  BMI    Body Mass Index: 21.61 kg/m      Reproductive/Obstetrics negative OB ROS                             Anesthesia Physical Anesthesia Plan  ASA: 3  Anesthesia Plan: General   Post-op Pain Management: Minimal or no pain anticipated   Induction: Intravenous  PONV Risk Score and Plan: Propofol infusion, TIVA and Treatment may vary due to age or medical condition  Airway Management Planned: Natural Airway  Additional Equipment:   Intra-op Plan:   Post-operative Plan:   Informed Consent: I have reviewed the patients History and Physical, chart,  labs and discussed the procedure including the risks, benefits and alternatives for the proposed anesthesia with the patient or authorized representative who has indicated his/her understanding and acceptance.   Patient has DNR.  Suspend DNR.   Dental Advisory Given  Plan Discussed with: CRNA and Surgeon  Anesthesia Plan Comments:         Anesthesia Quick Evaluation

## 2022-06-30 NOTE — Discharge Instructions (Addendum)
1.  Elevate the left lower extremity on pillows.  2.  Keep the bandage on the left foot clean, dry, and do not remove.  3.  Sponge bathe only left lower extremity.  4.  Take 1 pain pill, hydrocodone every 4 hours only if needed pain.  5.  Wear the surgical shoe on the left foot whenever walking or standing.  AMBULATORY SURGERY  DISCHARGE INSTRUCTIONS   The drugs that you were given will stay in your system until tomorrow so for the next 24 hours you should not:  Drive an automobile Make any legal decisions Drink any alcoholic beverage   You may resume regular meals tomorrow.  Today it is better to start with liquids and gradually work up to solid foods.  You may eat anything you prefer, but it is better to start with liquids, then soup and crackers, and gradually work up to solid foods.   Please notify your doctor immediately if you have any unusual bleeding, trouble breathing, redness and pain at the surgery site, drainage, fever, or pain not relieved by medication.    Additional Instructions:        Please contact your physician with any problems or Same Day Surgery at [phone], Monday through Friday 6 am to 4 pm, or [place] at [facility] number at [phone].

## 2022-06-30 NOTE — Progress Notes (Signed)
Messaged Dr. Cleda Mccreedy to inform him that the patient is requesting zofran for home, in case he develops nausea later today.

## 2022-06-30 NOTE — Op Note (Signed)
Date of operation: 06/30/2022.  Surgeon: Durward Fortes D.P.M.  Preoperative diagnosis: Gangrene left third toe.  Postoperative diagnosis: Same.  Procedure: Amputation left third toe.  Anesthesia: Local MAC.  Hemostasis: None.  Estimated blood loss: 5 cc.  Pathology: Left third toe.  Complications: None apparent.  Operative indications: This is a 89 year old male with recent development of gangrenous changes to the left third toe.  Underwent vascular procedure to increase blood flow to the left foot which was successful.  Progressive gangrenous changes to the third toe so decision was made for amputation of the third toe.  Operative procedure: Patient was taken to the operating room and placed on the table in the supine position.  Following satisfactory sedation the left foot was anesthetized with 17 cc of 0.5% Marcaine plain.  The foot was prepped and draped in the usual sterile fashion.  Attention directed to the distal left foot where an elliptical incision was made from dorsal to plantar around the medial and lateral base of the third toe.  The incision was carried periosteally sharply back down to the level of the joint where the toe was disarticulated and removed in toto.  The wound was flushed with copious amounts of sterile saline and then closed using 3-0 nylon vertical mattress and simple interrupted sutures.  Xeroform 4 x 4's and conformer applied to the left foot followed by Kerlix and an Ace wrap.  Patient was awakened and transported to the PACU with vital signs stable and in good condition.

## 2022-06-30 NOTE — H&P (Signed)
Subjective: Patient presents today for surgery for amputation of his left third toe.  Objective: Gangrenous changes to the distal aspect of the left third toe.  Mottled changes on the dorsum of the midfoot from the recent vascular procedure.  Assessment: PVD with gangrene left third toe.  Plan: Previous history and physical for the medical record reviewed.  Patient stable for surgery for amputation of his left third toe under local and sedation.

## 2022-07-03 ENCOUNTER — Encounter: Payer: Self-pay | Admitting: Podiatry

## 2022-07-03 LAB — SURGICAL PATHOLOGY

## 2022-07-03 NOTE — Anesthesia Postprocedure Evaluation (Signed)
Anesthesia Post Note  Patient: Charles Jennings  Procedure(s) Performed: AMPUTATION TOE - METATRSOPHALANGEAL JOINT - THIRD (Left: Toe)  Patient location during evaluation: PACU Anesthesia Type: General Level of consciousness: awake and alert Pain management: pain level controlled Vital Signs Assessment: post-procedure vital signs reviewed and stable Respiratory status: spontaneous breathing, nonlabored ventilation and respiratory function stable Cardiovascular status: blood pressure returned to baseline and stable Postop Assessment: no apparent nausea or vomiting Anesthetic complications: no   No notable events documented.   Last Vitals:  Vitals:   06/30/22 0845 06/30/22 0907  BP: (!) 157/56 (!) 160/75  Pulse: 71 77  Resp: 12 16  Temp: (!) 36.2 C (!) 36.1 C  SpO2: 94% 96%    Last Pain:  Vitals:   06/30/22 0907  TempSrc: Temporal  PainSc: 0-No pain                 [place] Ouch

## 2022-07-06 ENCOUNTER — Other Ambulatory Visit (INDEPENDENT_AMBULATORY_CARE_PROVIDER_SITE_OTHER): Payer: Self-pay | Admitting: Vascular Surgery

## 2022-07-06 DIAGNOSIS — I739 Peripheral vascular disease, unspecified: Secondary | ICD-10-CM

## 2022-07-07 ENCOUNTER — Encounter (INDEPENDENT_AMBULATORY_CARE_PROVIDER_SITE_OTHER): Payer: Medicare Other

## 2022-07-07 ENCOUNTER — Ambulatory Visit (INDEPENDENT_AMBULATORY_CARE_PROVIDER_SITE_OTHER): Payer: Medicare Other | Admitting: Nurse Practitioner

## 2022-07-12 ENCOUNTER — Encounter: Payer: Self-pay | Admitting: Internal Medicine

## 2022-07-12 ENCOUNTER — Inpatient Hospital Stay
Admission: EM | Admit: 2022-07-12 | Discharge: 2022-07-21 | DRG: 617 | Disposition: A | Discharge status: Skilled Nursing Facility | Payer: Medicare Other | Source: Ambulatory Visit | Attending: Student | Admitting: Student

## 2022-07-12 ENCOUNTER — Other Ambulatory Visit: Payer: Self-pay

## 2022-07-12 DIAGNOSIS — Z950 Presence of cardiac pacemaker: Secondary | ICD-10-CM

## 2022-07-12 DIAGNOSIS — Z95828 Presence of other vascular implants and grafts: Secondary | ICD-10-CM | POA: Diagnosis not present

## 2022-07-12 DIAGNOSIS — L02612 Cutaneous abscess of left foot: Secondary | ICD-10-CM | POA: Diagnosis present

## 2022-07-12 DIAGNOSIS — E1152 Type 2 diabetes mellitus with diabetic peripheral angiopathy with gangrene: Secondary | ICD-10-CM | POA: Diagnosis not present

## 2022-07-12 DIAGNOSIS — Z87891 Personal history of nicotine dependence: Secondary | ICD-10-CM

## 2022-07-12 DIAGNOSIS — I743 Embolism and thrombosis of arteries of the lower extremities: Secondary | ICD-10-CM | POA: Diagnosis present

## 2022-07-12 DIAGNOSIS — Z79899 Other long term (current) drug therapy: Secondary | ICD-10-CM

## 2022-07-12 DIAGNOSIS — E782 Mixed hyperlipidemia: Secondary | ICD-10-CM | POA: Diagnosis present

## 2022-07-12 DIAGNOSIS — N1831 Chronic kidney disease, stage 3a: Secondary | ICD-10-CM | POA: Diagnosis present

## 2022-07-12 DIAGNOSIS — Z7982 Long term (current) use of aspirin: Secondary | ICD-10-CM

## 2022-07-12 DIAGNOSIS — Z66 Do not resuscitate: Secondary | ICD-10-CM | POA: Diagnosis present

## 2022-07-12 DIAGNOSIS — F22 Delusional disorders: Secondary | ICD-10-CM | POA: Diagnosis not present

## 2022-07-12 DIAGNOSIS — Z7902 Long term (current) use of antithrombotics/antiplatelets: Secondary | ICD-10-CM

## 2022-07-12 DIAGNOSIS — Z8249 Family history of ischemic heart disease and other diseases of the circulatory system: Secondary | ICD-10-CM

## 2022-07-12 DIAGNOSIS — Z85828 Personal history of other malignant neoplasm of skin: Secondary | ICD-10-CM

## 2022-07-12 DIAGNOSIS — E876 Hypokalemia: Secondary | ICD-10-CM | POA: Diagnosis present

## 2022-07-12 DIAGNOSIS — M869 Osteomyelitis, unspecified: Secondary | ICD-10-CM | POA: Diagnosis present

## 2022-07-12 DIAGNOSIS — L03032 Cellulitis of left toe: Secondary | ICD-10-CM | POA: Diagnosis present

## 2022-07-12 DIAGNOSIS — M8618 Other acute osteomyelitis, other site: Secondary | ICD-10-CM | POA: Diagnosis not present

## 2022-07-12 DIAGNOSIS — I130 Hypertensive heart and chronic kidney disease with heart failure and stage 1 through stage 4 chronic kidney disease, or unspecified chronic kidney disease: Secondary | ICD-10-CM | POA: Diagnosis present

## 2022-07-12 DIAGNOSIS — I2510 Atherosclerotic heart disease of native coronary artery without angina pectoris: Secondary | ICD-10-CM | POA: Diagnosis present

## 2022-07-12 DIAGNOSIS — Z7984 Long term (current) use of oral hypoglycemic drugs: Secondary | ICD-10-CM

## 2022-07-12 DIAGNOSIS — I5032 Chronic diastolic (congestive) heart failure: Secondary | ICD-10-CM | POA: Diagnosis present

## 2022-07-12 DIAGNOSIS — Z7901 Long term (current) use of anticoagulants: Secondary | ICD-10-CM | POA: Diagnosis not present

## 2022-07-12 DIAGNOSIS — M868X7 Other osteomyelitis, ankle and foot: Secondary | ICD-10-CM | POA: Diagnosis present

## 2022-07-12 DIAGNOSIS — Z794 Long term (current) use of insulin: Secondary | ICD-10-CM | POA: Diagnosis not present

## 2022-07-12 DIAGNOSIS — I70262 Atherosclerosis of native arteries of extremities with gangrene, left leg: Secondary | ICD-10-CM | POA: Diagnosis present

## 2022-07-12 DIAGNOSIS — E1169 Type 2 diabetes mellitus with other specified complication: Principal | ICD-10-CM | POA: Diagnosis present

## 2022-07-12 DIAGNOSIS — R531 Weakness: Secondary | ICD-10-CM

## 2022-07-12 DIAGNOSIS — E871 Hypo-osmolality and hyponatremia: Secondary | ICD-10-CM | POA: Diagnosis present

## 2022-07-12 DIAGNOSIS — I10 Essential (primary) hypertension: Secondary | ICD-10-CM | POA: Diagnosis present

## 2022-07-12 DIAGNOSIS — I739 Peripheral vascular disease, unspecified: Secondary | ICD-10-CM

## 2022-07-12 DIAGNOSIS — T82868A Thrombosis of vascular prosthetic devices, implants and grafts, initial encounter: Secondary | ICD-10-CM | POA: Diagnosis not present

## 2022-07-12 DIAGNOSIS — I998 Other disorder of circulatory system: Secondary | ICD-10-CM | POA: Diagnosis present

## 2022-07-12 DIAGNOSIS — E119 Type 2 diabetes mellitus without complications: Secondary | ICD-10-CM

## 2022-07-12 DIAGNOSIS — L97529 Non-pressure chronic ulcer of other part of left foot with unspecified severity: Secondary | ICD-10-CM | POA: Diagnosis present

## 2022-07-12 DIAGNOSIS — I959 Hypotension, unspecified: Secondary | ICD-10-CM | POA: Diagnosis present

## 2022-07-12 DIAGNOSIS — E1122 Type 2 diabetes mellitus with diabetic chronic kidney disease: Secondary | ICD-10-CM | POA: Diagnosis present

## 2022-07-12 DIAGNOSIS — E1140 Type 2 diabetes mellitus with diabetic neuropathy, unspecified: Secondary | ICD-10-CM | POA: Diagnosis present

## 2022-07-12 DIAGNOSIS — Z9889 Other specified postprocedural states: Secondary | ICD-10-CM | POA: Diagnosis not present

## 2022-07-12 DIAGNOSIS — I96 Gangrene, not elsewhere classified: Principal | ICD-10-CM

## 2022-07-12 DIAGNOSIS — E639 Nutritional deficiency, unspecified: Secondary | ICD-10-CM | POA: Diagnosis present

## 2022-07-12 LAB — LACTIC ACID, PLASMA
Lactic Acid, Venous: 1.8 mmol/L (ref 0.5–1.9)
Lactic Acid, Venous: 1.7 mmol/L (ref 0.5–1.9)

## 2022-07-12 LAB — COMPREHENSIVE METABOLIC PANEL
ALT: 27 U/L (ref 0–44)
AST: 31 U/L (ref 15–41)
Albumin: 3.2 g/dL — ABNORMAL LOW (ref 3.5–5.0)
Alkaline Phosphatase: 212 U/L — ABNORMAL HIGH (ref 38–126)
Anion gap: 19 — ABNORMAL HIGH (ref 5–15)
BUN: 58 mg/dL — ABNORMAL HIGH (ref 8–23)
CO2: 22 mmol/L (ref 22–32)
Calcium: 9.3 mg/dL (ref 8.9–10.3)
Chloride: 90 mmol/L — ABNORMAL LOW (ref 98–111)
Creatinine, Ser: 1.36 mg/dL — ABNORMAL HIGH (ref 0.61–1.24)
GFR, Estimated: 49 mL/min — ABNORMAL LOW (ref >60)
Glucose, Bld: 242 mg/dL — ABNORMAL HIGH (ref 70–99)
Potassium: 4.5 mmol/L (ref 3.5–5.1)
Sodium: 131 mmol/L — ABNORMAL LOW (ref 135–145)
Total Bilirubin: 1.9 mg/dL — ABNORMAL HIGH (ref 0.3–1.2)
Total Protein: 6.9 g/dL (ref 6.5–8.1)

## 2022-07-12 LAB — GLUCOSE, CAPILLARY
Glucose-Capillary: 228 mg/dL — ABNORMAL HIGH (ref 70–99)
Glucose-Capillary: 72 mg/dL (ref 70–99)

## 2022-07-12 LAB — CBC WITH DIFFERENTIAL/PLATELET
Abs Immature Granulocytes: 0.13 10*3/uL — ABNORMAL HIGH (ref 0.00–0.07)
Basophils Absolute: 0.1 10*3/uL (ref 0.0–0.1)
Basophils Relative: 0 %
Eosinophils Absolute: 0 10*3/uL (ref 0.0–0.5)
Eosinophils Relative: 0 %
HCT: 51.7 % (ref 39.0–52.0)
Hemoglobin: 17.1 g/dL — ABNORMAL HIGH (ref 13.0–17.0)
Immature Granulocytes: 1 %
Lymphocytes Relative: 9 %
Lymphs Abs: 1.5 10*3/uL (ref 0.7–4.0)
MCH: 29.1 pg (ref 26.0–34.0)
MCHC: 33.1 g/dL (ref 30.0–36.0)
MCV: 87.9 fL (ref 80.0–100.0)
Monocytes Absolute: 1.2 10*3/uL — ABNORMAL HIGH (ref 0.1–1.0)
Monocytes Relative: 7 %
Neutro Abs: 13.8 10*3/uL — ABNORMAL HIGH (ref 1.7–7.7)
Neutrophils Relative %: 83 %
Platelets: 211 10*3/uL (ref 150–400)
RBC: 5.88 MIL/uL — ABNORMAL HIGH (ref 4.22–5.81)
RDW: 13.3 % (ref 11.5–15.5)
WBC: 16.8 10*3/uL — ABNORMAL HIGH (ref 4.0–10.5)
nRBC: 0 % (ref 0.0–0.2)

## 2022-07-12 LAB — APTT: aPTT: 29 seconds (ref 24–36)

## 2022-07-12 LAB — PROTIME-INR
INR: 1.1 (ref 0.8–1.2)
Prothrombin Time: 13.7 seconds (ref 11.4–15.2)

## 2022-07-12 LAB — CK: Total CK: 66 U/L (ref 49–397)

## 2022-07-12 MED ORDER — INSULIN ASPART 100 UNIT/ML IJ SOLN
0.0000 [IU] | Freq: Three times a day (TID) | INTRAMUSCULAR | Status: DC
  Administered 2022-07-12: 3 [IU] via SUBCUTANEOUS
  Administered 2022-07-13 – 2022-07-14 (×2): 1 [IU] via SUBCUTANEOUS
  Administered 2022-07-15: 7 [IU] via SUBCUTANEOUS
  Administered 2022-07-15: 3 [IU] via SUBCUTANEOUS
  Administered 2022-07-16: 5 [IU] via SUBCUTANEOUS
  Administered 2022-07-17: 2 [IU] via SUBCUTANEOUS
  Administered 2022-07-17 – 2022-07-18 (×2): 3 [IU] via SUBCUTANEOUS
  Administered 2022-07-18: 1 [IU] via SUBCUTANEOUS
  Administered 2022-07-18: 2 [IU] via SUBCUTANEOUS
  Administered 2022-07-19: 1 [IU] via SUBCUTANEOUS
  Administered 2022-07-19 – 2022-07-20 (×5): 2 [IU] via SUBCUTANEOUS
  Administered 2022-07-21: 3 [IU] via SUBCUTANEOUS
  Administered 2022-07-21: 1 [IU] via SUBCUTANEOUS
  Filled 2022-07-12 (×19): qty 1

## 2022-07-12 MED ORDER — ONDANSETRON HCL 4 MG PO TABS: 4.0000 mg | ORAL_TABLET | Freq: Four times a day (QID) | ORAL | Status: AC | PRN

## 2022-07-12 MED ORDER — HEPARIN SODIUM (PORCINE) 5000 UNIT/ML IJ SOLN
5000.0000 [IU] | Freq: Three times a day (TID) | INTRAMUSCULAR | Status: AC
  Administered 2022-07-12 (×2): 5000 [IU] via SUBCUTANEOUS
  Filled 2022-07-12 (×2): qty 1

## 2022-07-12 MED ORDER — ACETAMINOPHEN 325 MG PO TABS: 650.0000 mg | ORAL_TABLET | Freq: Four times a day (QID) | ORAL | Status: AC | PRN

## 2022-07-12 MED ORDER — METRONIDAZOLE 500 MG/100ML IV SOLN
500.0000 mg | Freq: Two times a day (BID) | INTRAVENOUS | Status: DC
  Administered 2022-07-12 – 2022-07-17 (×11): 500 mg via INTRAVENOUS
  Filled 2022-07-12 (×11): qty 100

## 2022-07-12 MED ORDER — ONDANSETRON HCL 4 MG/2ML IJ SOLN
4.0000 mg | Freq: Four times a day (QID) | INTRAMUSCULAR | Status: AC | PRN
  Filled 2022-07-12: qty 2

## 2022-07-12 MED ORDER — VANCOMYCIN HCL 750 MG/150ML IV SOLN
750.0000 mg | INTRAVENOUS | Status: DC
  Administered 2022-07-13 – 2022-07-15 (×3): 750 mg via INTRAVENOUS
  Filled 2022-07-12 (×3): qty 150

## 2022-07-12 MED ORDER — OXYCODONE-ACETAMINOPHEN 5-325 MG PO TABS
1.0000 | ORAL_TABLET | Freq: Three times a day (TID) | ORAL | Status: AC | PRN
  Administered 2022-07-12 – 2022-07-13 (×4): 1 via ORAL
  Filled 2022-07-12 (×4): qty 1

## 2022-07-12 MED ORDER — SENNOSIDES-DOCUSATE SODIUM 8.6-50 MG PO TABS: 1.0000 | ORAL_TABLET | Freq: Every evening | ORAL | Status: DC | PRN

## 2022-07-12 MED ORDER — SODIUM CHLORIDE 0.9 % IV SOLN
2.0000 g | Freq: Two times a day (BID) | INTRAVENOUS | Status: DC
  Administered 2022-07-12 – 2022-07-17 (×10): 2 g via INTRAVENOUS
  Filled 2022-07-12: qty 2
  Filled 2022-07-12 (×10): qty 12.5
  Filled 2022-07-12: qty 2

## 2022-07-12 MED ORDER — DILTIAZEM HCL ER COATED BEADS 240 MG PO CP24
240.0000 mg | ORAL_CAPSULE | Freq: Every day | ORAL | Status: DC
  Administered 2022-07-13 – 2022-07-21 (×6): 240 mg via ORAL
  Filled 2022-07-12 (×8): qty 1
  Filled 2022-07-12: qty 2

## 2022-07-12 MED ORDER — HYDRALAZINE HCL 20 MG/ML IJ SOLN: 5.0000 mg | Freq: Three times a day (TID) | INTRAMUSCULAR | Status: AC | PRN

## 2022-07-12 MED ORDER — SODIUM CHLORIDE 0.9 % IV SOLN
2.0000 g | Freq: Once | INTRAVENOUS | Status: AC
  Administered 2022-07-12: 2 g via INTRAVENOUS
  Filled 2022-07-12: qty 12.5

## 2022-07-12 MED ORDER — VANCOMYCIN HCL 1500 MG/300ML IV SOLN
1500.0000 mg | Freq: Once | INTRAVENOUS | Status: AC
  Administered 2022-07-12: 1500 mg via INTRAVENOUS
  Filled 2022-07-12: qty 300

## 2022-07-12 MED ORDER — SODIUM CHLORIDE 0.9 % IV SOLN: INTRAVENOUS | Status: DC

## 2022-07-12 MED ORDER — HEPARIN SODIUM (PORCINE) 5000 UNIT/ML IJ SOLN: 5000.0000 [IU] | Freq: Three times a day (TID) | INTRAMUSCULAR | Status: DC

## 2022-07-12 MED ORDER — SODIUM CHLORIDE 0.9 % IV SOLN: INTRAVENOUS | Status: AC

## 2022-07-12 MED ORDER — GABAPENTIN 300 MG PO CAPS
300.0000 mg | ORAL_CAPSULE | Freq: Every day | ORAL | Status: DC
  Administered 2022-07-13 – 2022-07-21 (×8): 300 mg via ORAL
  Filled 2022-07-12 (×8): qty 1

## 2022-07-12 MED ORDER — MORPHINE SULFATE (PF) 2 MG/ML IV SOLN
2.0000 mg | INTRAVENOUS | Status: AC | PRN
  Administered 2022-07-14 – 2022-07-19 (×4): 2 mg via INTRAVENOUS
  Filled 2022-07-12 (×4): qty 1

## 2022-07-12 MED ORDER — INSULIN ASPART 100 UNIT/ML IJ SOLN
0.0000 [IU] | Freq: Every day | INTRAMUSCULAR | Status: DC
  Administered 2022-07-19: 2 [IU] via SUBCUTANEOUS
  Filled 2022-07-12: qty 1

## 2022-07-12 MED ORDER — ACETAMINOPHEN 650 MG RE SUPP: 650.0000 mg | Freq: Four times a day (QID) | RECTAL | Status: AC | PRN

## 2022-07-12 MED ORDER — LISINOPRIL 20 MG PO TABS
40.0000 mg | ORAL_TABLET | Freq: Every day | ORAL | Status: DC
  Administered 2022-07-13 – 2022-07-21 (×7): 40 mg via ORAL
  Filled 2022-07-12: qty 8
  Filled 2022-07-12 (×3): qty 2
  Filled 2022-07-12: qty 8
  Filled 2022-07-12 (×4): qty 2

## 2022-07-12 MED ORDER — ATORVASTATIN CALCIUM 20 MG PO TABS
40.0000 mg | ORAL_TABLET | Freq: Every evening | ORAL | Status: DC
  Administered 2022-07-12 – 2022-07-20 (×8): 40 mg via ORAL
  Filled 2022-07-12 (×8): qty 2

## 2022-07-12 NOTE — Assessment & Plan Note (Addendum)
-   Diltiazem 240 mg daily, lisinopril 40 mg daily resumed - Hydralazine 5 mg IV every 8 hours as needed for SBP greater than 175, 4 days ordered

## 2022-07-12 NOTE — ED Provider Notes (Signed)
[facility] Provider Note    Event Date/Time   First MD Initiated Contact with Patient 07/12/22 1117     (approximate)   History   Foot Problem   HPI  Charles Jennings is a 89 y.o. male with a history of diabetes, lower limb ischemia amputation sent in for evaluation by podiatrist.  Review of record demonstrates on January 26 patient had amputation to the left third toe.  Sent in today for gangrenous changes to additional toe     Physical Exam   Triage Vital Signs: ED Triage Vitals  Enc Vitals Group     BP 07/12/22 1101 115/70     Pulse Rate 07/12/22 1101 92     Resp 07/12/22 1101 18     Temp 07/12/22 1101 (!) 97.5 F (36.4 C)     Temp Source 07/12/22 1101 Oral     SpO2 07/12/22 1101 100 %     Weight 07/12/22 1104 62.5 kg (137 lb 12.6 oz)     Height 07/12/22 1104 1.702 m ('5\' 7"'$ )     Head Circumference --      Peak Flow --      Pain Score 07/12/22 1103 0     Pain Loc --      Pain Edu? --      Excl. in [place]? --     Most recent vital signs: Vitals:   07/12/22 1101 07/12/22 1116  BP: 115/70 (!) 165/67  Pulse: 92 75  Resp: 18 18  Temp: (!) 97.5 F (36.4 C) 97.6 F (36.4 C)  SpO2: 100% 96%    General: Awake, no distress.  CV:  Good peripheral perfusion.  Resp:  Normal effort.  Abd:  No distention.  Other:  Gangrenous changes left second toe with erythema to the foot   ED Results / Procedures / Treatments   Labs (all labs ordered are listed, but only abnormal results are displayed) Labs Reviewed  CBC WITH DIFFERENTIAL/PLATELET - Abnormal; Notable for the following components:      Result Value   WBC 16.8 (*)    RBC 5.88 (*)    Hemoglobin 17.1 (*)    Neutro Abs 13.8 (*)    Monocytes Absolute 1.2 (*)    Abs Immature Granulocytes 0.13 (*)    All other components within normal limits  COMPREHENSIVE METABOLIC PANEL - Abnormal; Notable for the following components:   Sodium 131 (*)    Chloride 90 (*)    Glucose, Bld 242 (*)     BUN 58 (*)    Creatinine, Ser 1.36 (*)    Albumin 3.2 (*)    Alkaline Phosphatase 212 (*)    Total Bilirubin 1.9 (*)    GFR, Estimated 49 (*)    Anion gap 19 (*)    All other components within normal limits  CULTURE, BLOOD (ROUTINE X 2)  CULTURE, BLOOD (ROUTINE X 2)  LACTIC ACID, PLASMA  LACTIC ACID, PLASMA  PROTIME-INR  APTT     EKG     RADIOLOGY     PROCEDURES:  Critical Care performed: yes  CRITICAL CARE Performed by: Lavonia Drafts   Total critical care time: 30 minutes  Critical care time was exclusive of separately billable procedures and treating other patients.  Critical care was necessary to treat or prevent imminent or life-threatening deterioration.  Critical care was time spent personally by me on the following activities: development of treatment plan with patient and/or surrogate as well as nursing, discussions  with consultants, evaluation of patient's response to treatment, examination of patient, obtaining history from patient or surrogate, ordering and performing treatments and interventions, ordering and review of laboratory studies, ordering and review of radiographic studies, pulse oximetry and re-evaluation of patient's condition.   Procedures   MEDICATIONS ORDERED IN ED: Medications  metroNIDAZOLE (FLAGYL) IVPB 500 mg (has no administration in time range)  vancomycin (VANCOREADY) IVPB 1500 mg/300 mL (has no administration in time range)  ceFEPIme (MAXIPIME) 2 g in sodium chloride 0.9 % 100 mL IVPB (has no administration in time range)     IMPRESSION / MDM / ASSESSMENT AND PLAN / ED COURSE  I reviewed the triage vital signs and the nursing notes. Patient's presentation is most consistent with acute presentation with potential threat to life or bodily function.  Patient with history of limb ischemia, diabetes presents with gangrenous changes to second toe on the left foot with surrounding erythema consistent with cellulitis.  Currently  afebrile, however white blood cell count is significantly elevated, elevated BUN and creatinine noted.  Concern for possible sepsis, lactic, blood cultures added, broad-spectrum IV antibiotics ordered  Will require admission to the hospital I have contacted the hospitalist        FINAL CLINICAL IMPRESSION(S) / ED DIAGNOSES   Final diagnoses:  Gangrene of toe ([place])     Rx / DC Orders   ED Discharge Orders     None        Note:  This document was prepared using Dragon voice recognition software and may include unintentional dictation errors.   Lavonia Drafts, MD 07/12/22 1213

## 2022-07-12 NOTE — Assessment & Plan Note (Addendum)
Left third toe osteomyelitis With possible cellulitis at the site of left foot amputation - Continue with vancomycin and cefepime - Blood cultures x 2 are in process, please follow-up for hematogenous spread - Sodium chloride 125 mL/h, 1 day ordered - Podiatry has been consulted, Dr. Cleda Mccreedy - Vascular consulted, n.p.o. plan for lower extremity angiogram tomorrow, 07/13/2022 - Admit to [facility], inpatient

## 2022-07-12 NOTE — Consult Note (Signed)
Reason for Consult: Gangrenous changes left second toe with infection left forefoot. Referring Physician: Cox  ROI JAFARI is an 89 y.o. male.  HPI: This is a 89 year old male with recent amputation of his left third toe secondary to significant peripheral vascular disease and gangrenous changes.  Postoperatively has continued to have severe pain in his left foot with progressive gangrenous changes now in the second toe as well as foul-smelling odor from the surgical site.  Patient was seen outpatient today and sent over to the emergency department for admission and surgical consideration.  Past Medical History:  Diagnosis Date   (HFpEF) heart failure with preserved ejection fraction (HCC)    Arthritis    Complete heart block ([place])    a.) s/p dual chamber Medtronic PPM placement 01/31/2016   Frequent PVCs    Hemorrhoids    a.) s/p banding 01/2021   HLD (hyperlipidemia)    Hypertension    Long term current use of antithrombotics/antiplatelets    a.) on DAPT (ASA + clopidogrel)   Peripheral vascular disease ([place])    Presence of permanent cardiac pacemaker 01/31/2016   a.) s/p Advisa DR MRI SureScan dual chamber PPM (SN: [id number])   SDH (subdural hematoma) (HCC)    Skin cancer    Type 2 diabetes mellitus treated with insulin [facility])     Past Surgical History:  Procedure Laterality Date   AMPUTATION TOE Left 06/30/2022   Procedure: AMPUTATION TOE - [place];  Surgeon: Sharlotte Alamo, DPM;  Location: [facility] ORS;  Service: Podiatry;  Laterality: Left;   EYE SURGERY     FEMORAL ENDARTERECTOMY Bilateral    HEMORRHOID BANDING N/A 01/2021   HERNIA REPAIR     double hernia   LOWER EXTREMITY ANGIOGRAPHY Left 06/12/2022   Procedure: Lower Extremity Angiography;  Surgeon: Algernon Huxley, MD;  Location: [facility];  Service: Cardiovascular;  Laterality: Left;   LOWER EXTREMITY INTERVENTION Bilateral 06/14/2022   Procedure: LOWER EXTREMITY INTERVENTION;   Surgeon: Algernon Huxley, MD;  Location: [facility];  Service: Cardiovascular;  Laterality: Bilateral;   PACEMAKER INSERTION Left 01/31/2016   Procedure: PACEMAKER INSERTION; Location: [facility]; Surgeon: Dimas Alexandria, MD    History reviewed. No pertinent family history.  Social History:  reports that he has quit smoking. His smoking use included cigarettes. He has never used smokeless tobacco. He reports that he does not currently use alcohol. He reports that he does not use drugs.  Allergies: No Known Allergies  Medications: Scheduled:  insulin aspart  0-5 Units Subcutaneous QHS   insulin aspart  0-9 Units Subcutaneous TID WC    Results for orders placed or performed during the hospital encounter of 07/12/22 (from the past 48 hour(s))  CBC with Differential     Status: Abnormal   Collection Time: 07/12/22 11:30 AM  Result Value Ref Range   WBC 16.8 (H) 4.0 - 10.5 K/uL   RBC 5.88 (H) 4.22 - 5.81 MIL/uL   Hemoglobin 17.1 (H) 13.0 - 17.0 g/dL   HCT 51.7 39.0 - 52.0 %   MCV 87.9 80.0 - 100.0 fL   MCH 29.1 26.0 - 34.0 pg   MCHC 33.1 30.0 - 36.0 g/dL   RDW 13.3 11.5 - 15.5 %   Platelets 211 150 - 400 K/uL   nRBC 0.0 0.0 - 0.2 %   Neutrophils Relative % 83 %   Neutro Abs 13.8 (H) 1.7 - 7.7 K/uL   Lymphocytes Relative 9 %   Lymphs  Abs 1.5 0.7 - 4.0 K/uL   Monocytes Relative 7 %   Monocytes Absolute 1.2 (H) 0.1 - 1.0 K/uL   Eosinophils Relative 0 %   Eosinophils Absolute 0.0 0.0 - 0.5 K/uL   Basophils Relative 0 %   Basophils Absolute 0.1 0.0 - 0.1 K/uL   Immature Granulocytes 1 %   Abs Immature Granulocytes 0.13 (H) 0.00 - 0.07 K/uL    Comment: Performed at [facility], [address] [place], [place] [address]  Comprehensive metabolic panel     Status: Abnormal   Collection Time: 07/12/22 11:30 AM  Result Value Ref Range   Sodium 131 (L) 135 - 145 mmol/L   Potassium 4.5 3.5 - 5.1 mmol/L   Chloride 90 (L) 98 - 111 mmol/L   CO2 22 22 - 32 mmol/L   Glucose, Bld  242 (H) 70 - 99 mg/dL    Comment: Glucose reference range applies only to samples taken after fasting for at least 8 hours.   BUN 58 (H) 8 - 23 mg/dL   Creatinine, Ser 1.36 (H) 0.61 - 1.24 mg/dL   Calcium 9.3 8.9 - 10.3 mg/dL   Total Protein 6.9 6.5 - 8.1 g/dL   Albumin 3.2 (L) 3.5 - 5.0 g/dL   AST 31 15 - 41 U/L   ALT 27 0 - 44 U/L   Alkaline Phosphatase 212 (H) 38 - 126 U/L   Total Bilirubin 1.9 (H) 0.3 - 1.2 mg/dL   GFR, Estimated 49 (L) >60 mL/min    Comment: (NOTE) Calculated using the CKD-EPI Creatinine Equation (2021)    Anion gap 19 (H) 5 - 15    Comment: Performed at [facility], [address] [place], [place] [address]  Lactic acid, plasma     Status: None   Collection Time: 07/12/22 12:00 PM  Result Value Ref Range   Lactic Acid, Venous 1.8 0.5 - 1.9 mmol/L    Comment: Performed at [facility], [address] [place], [place] [address]  Protime-INR     Status: None   Collection Time: 07/12/22 12:01 PM  Result Value Ref Range   Prothrombin Time 13.7 11.4 - 15.2 seconds   INR 1.1 0.8 - 1.2    Comment: (NOTE) INR goal varies based on device and disease states. Performed at [facility], [address] [place], [place] [address]   APTT     Status: None   Collection Time: 07/12/22 12:01 PM  Result Value Ref Range   aPTT 29 24 - 36 seconds    Comment: Performed at [facility], [address] [place], [place] [address]    No results found.  Review of Systems  Constitutional:  Negative for chills and fever.  HENT:  Negative for sinus pain and sore throat.   Respiratory:  Negative for cough and shortness of breath.   Cardiovascular:  Negative for chest pain and palpitations.  Gastrointestinal:  Negative for nausea and vomiting.  Genitourinary:  Negative for frequency and urgency.  Musculoskeletal:        Patient relates continued severe pain in his left foot.  Relates multiple falls recently.  Recent amputation of the  left third toe.  Skin:        Unable to evaluate his left foot as it has been covered with a bandage.  Neurological:        Sensation appears grossly intact.  Psychiatric/Behavioral:  Negative for confusion. The patient is not nervous/anxious.  Blood pressure (!) 149/70, pulse 75, temperature 97.6 F (36.4 C), temperature source Oral, resp. rate 20, height '5\' 7"'$  (1.702 m), weight 62.5 kg, SpO2 96 %. Physical Exam Cardiovascular:     Comments: Pulses difficult to palpate. Musculoskeletal:     Comments: Recent amputation of the left third toe.  Guarded range of motion of the left foot secondary to pain.  Muscle testing deferred.  Skin:    Comments: The skin is warm dry and somewhat atrophic.  Gangrenous changes noted to the distal aspect of the left second toe.  Foul-smelling drainage noted from the superior portion of the amputation site at the third toe.  Significant erythema with some edema along the dorsal aspect of the foot.  Neurological:     Comments: Sensation appears grossly intact.     Assessment/Plan: Assessment: 1.  Gangrenous changes left second toe. 2.  Cellulitis with abscess left foot at amputation site. 3.  Significant peripheral vascular disease.  Plan: Patient will most likely require further amputation with possible debridement in the left foot.  Could be at risk for transmetatarsal amputation.  Messaged vascular and they may want to reevaluate his circulation prior to further surgery.  I will discuss this patient's case with Dr. Vickki Muff who will be taking over starting tomorrow.  Durward Fortes 07/12/2022, 1:32 PM

## 2022-07-12 NOTE — Hospital Course (Addendum)
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  Initial vitals in the ED showed temperature of 97.6, respiration rate of 18, heart rate 76, blood pressure 165/67, SpO2 of 96% on room air.  Serum sodium is 131, potassium 4.5, chloride 90, bicarb 22, BUN of 53, serum creatinine 1.36, nonfasting blood glucose 242, EGFR 49, WBC 16.8, hemoglobin 17.1, platelets of 211.  Anion gap was elevated at 19.  Alk phos elevated at 212.  ED treatment: Cefepime, vancomycin, metronidazole.

## 2022-07-12 NOTE — H&P (View-Only) (Signed)
Hospital Consult    Reason for Consult:  Gangrene to Left second  toe Requesting Physician:  Dr Sharlotte Alamo MD MRN #:  [id number]  History of Present Illness: This is a 89 y.o. male Charles Jennings is a 89 y.o. male with a history of diabetes, lower limb ischemia amputation sent in for evaluation by podiatrist.  Review of record demonstrates on June 14, 2022 patient had Bilateral iliac stents placed and on January 26 patient had amputation to the left third toe.  Sent in today for gangrenous changes to left second toe and infection to third toe amputation site.  On exam he is resting comfortably in the ED stretcher. He states this all started a couple of weeks ago. He states it started to get painful about a week ago. He knew he had an appointment to follow up with Dr Cleda Mccreedy today so he waited to see him rather than come to the emergency department.  He has no other complaints at this time and his vitals all remain stable.   Past Medical History:  Diagnosis Date   (HFpEF) heart failure with preserved ejection fraction (HCC)    Arthritis    Complete heart block ([place])    a.) s/p dual chamber Medtronic PPM placement 01/31/2016   Frequent PVCs    Hemorrhoids    a.) s/p banding 01/2021   HLD (hyperlipidemia)    Hypertension    Long term current use of antithrombotics/antiplatelets    a.) on DAPT (ASA + clopidogrel)   Peripheral vascular disease ([place])    Presence of permanent cardiac pacemaker 01/31/2016   a.) s/p Advisa DR MRI SureScan dual chamber PPM (SN: [id number])   SDH (subdural hematoma) (HCC)    Skin cancer    Type 2 diabetes mellitus treated with insulin [facility])     Past Surgical History:  Procedure Laterality Date   AMPUTATION TOE Left 06/30/2022   Procedure: AMPUTATION TOE - [place];  Surgeon: Sharlotte Alamo, DPM;  Location: [facility] ORS;  Service: Podiatry;  Laterality: Left;   EYE SURGERY     FEMORAL ENDARTERECTOMY Bilateral    HEMORRHOID BANDING N/A  01/2021   HERNIA REPAIR     double hernia   LOWER EXTREMITY ANGIOGRAPHY Left 06/12/2022   Procedure: Lower Extremity Angiography;  Surgeon: Algernon Huxley, MD;  Location: [facility];  Service: Cardiovascular;  Laterality: Left;   LOWER EXTREMITY INTERVENTION Bilateral 06/14/2022   Procedure: LOWER EXTREMITY INTERVENTION;  Surgeon: Algernon Huxley, MD;  Location: [facility];  Service: Cardiovascular;  Laterality: Bilateral;   PACEMAKER INSERTION Left 01/31/2016   Procedure: PACEMAKER INSERTION; Location: [facility]; Surgeon: Dimas Alexandria, MD    No Known Allergies  Prior to Admission medications   Medication Sig Start Date End Date Taking? Authorizing Provider  ASPIR-LOW 81 MG EC tablet Take 81 mg by mouth daily. 11/30/15   [provider]  atorvastatin (LIPITOR) 40 MG tablet Take 1 tablet (40 mg total) by mouth every evening. 06/15/22 07/15/22  Richarda Osmond, MD  clopidogrel (PLAVIX) 75 MG tablet Take 1 tablet (75 mg total) by mouth daily. 06/16/22   Richarda Osmond, MD  diltiazem (CARDIZEM CD) 240 MG 24 hr capsule Take 240 mg by mouth daily.    [provider]  gabapentin (NEURONTIN) 300 MG capsule Take 300 mg by mouth daily.    [provider]  HYDROcodone-acetaminophen (NORCO/VICODIN) 5-325 MG tablet Take 1 tablet by mouth every 4 (four) hours as needed  for moderate pain.    [provider]  JARDIANCE 10 MG TABS tablet Take 10 mg by mouth daily.    [provider]  lisinopril (ZESTRIL) 40 MG tablet Take 40 mg by mouth daily.    [provider]  loratadine (CLARITIN) 10 MG tablet Take 1 tablet by mouth daily. 11/30/15   [provider]  metFORMIN (GLUCOPHAGE) 500 MG tablet Take 500 mg by mouth 2 (two) times daily with a meal. 11/30/15   [provider]  NOVOLIN 70/30 (70-30) 100 UNIT/ML injection Inject 10 Units into the skin daily with breakfast. Patient taking differently: Inject into the skin  daily with breakfast. Inject subcutaneously once daily. Sliding scale 06/15/22   Richarda Osmond, MD  prednisoLONE acetate (PRED FORTE) 1 % ophthalmic suspension Place 1 drop into both eyes 2 (two) times daily.    [provider]    Social History   Socioeconomic History   Marital status: Widowed    Spouse name: Not on file   Number of children: Not on file   Years of education: Not on file   Highest education level: Not on file  Occupational History   Not on file  Tobacco Use   Smoking status: Former    Types: Cigarettes   Smokeless tobacco: Never  Substance and Sexual Activity   Alcohol use: Not Currently   Drug use: Never   Sexual activity: Not on file  Other Topics Concern   Not on file  Social History Narrative   Lives alone   Social Determinants of Health   Financial Resource Strain: Not on file  Food Insecurity: No Food Insecurity (06/12/2022)   Hunger Vital Sign    Worried About Running Out of Food in the Last Year: Never true    Ran Out of Food in the Last Year: Never true  Transportation Needs: No Transportation Needs (06/12/2022)   PRAPARE - Hydrologist (Medical): No    Lack of Transportation (Non-Medical): No  Physical Activity: Not on file  Stress: Not on file  Social Connections: Not on file  Intimate Partner Violence: Not At Risk (06/12/2022)   Humiliation, Afraid, Rape, and Kick questionnaire    Fear of Current or Ex-Partner: No    Emotionally Abused: No    Physically Abused: No    Sexually Abused: No     No family history on file.  ROS: Otherwise negative unless mentioned in HPI  Physical Examination  Vitals:   07/12/22 1101 07/12/22 1116  BP: 115/70 (!) 165/67  Pulse: 92 75  Resp: 18 18  Temp: (!) 97.5 F (36.4 C) 97.6 F (36.4 C)  SpO2: 100% 96%   Body mass index is 21.58 kg/m.  General:  WDWN in NAD Gait: Not observed HENT: WNL, normocephalic Pulmonary: normal non-labored breathing, without  Rales, rhonchi,  wheezing Cardiac: regular, with permanent Vitoria Conyer maker, without  Murmurs, rubs or gallops; without carotid bruits Abdomen: Positive Bowel sounds, soft, flat, NT/ND, no masses Skin: without rashes Vascular Exam/Pulses: Left lower extremity with gangrene of the second toe and an odorous infection of prior 3rd toe amputation site.  Extremities: with ischemic changes, with Gangrene , with cellulitis; without open wounds;  Musculoskeletal: no muscle wasting or atrophy  Neurologic: A&O X 3;  No focal weakness or paresthesias are detected; speech is fluent/normal Psychiatric:  The pt has Normal affect. Lymph:  Unremarkable  CBC    Component Value Date/Time   WBC 16.8 (H) 07/12/2022  1130   RBC 5.88 (H) 07/12/2022 1130   HGB 17.1 (H) 07/12/2022 1130   HGB 15.5 01/22/2013 1546   HCT 51.7 07/12/2022 1130   HCT 45.4 01/22/2013 1546   PLT 211 07/12/2022 1130   PLT 203 01/22/2013 1546   MCV 87.9 07/12/2022 1130   MCV 88 01/22/2013 1546   MCH 29.1 07/12/2022 1130   MCHC 33.1 07/12/2022 1130   RDW 13.3 07/12/2022 1130   RDW 13.9 01/22/2013 1546   LYMPHSABS 1.5 07/12/2022 1130   MONOABS 1.2 (H) 07/12/2022 1130   EOSABS 0.0 07/12/2022 1130   BASOSABS 0.1 07/12/2022 1130    BMET    Component Value Date/Time   NA 131 (L) 07/12/2022 1130   NA 137 01/22/2013 1546   K 4.5 07/12/2022 1130   K 3.9 01/22/2013 1546   CL 90 (L) 07/12/2022 1130   CL 104 01/22/2013 1546   CO2 22 07/12/2022 1130   CO2 28 01/22/2013 1546   GLUCOSE 242 (H) 07/12/2022 1130   GLUCOSE 211 (H) 01/22/2013 1546   BUN 58 (H) 07/12/2022 1130   BUN 18 01/22/2013 1546   CREATININE 1.36 (H) 07/12/2022 1130   CREATININE 1.35 (H) 01/22/2013 1546   CALCIUM 9.3 07/12/2022 1130   CALCIUM 8.9 01/22/2013 1546   GFRNONAA 49 (L) 07/12/2022 1130   GFRNONAA 48 (L) 01/22/2013 1546   GFRAA >60 01/28/2016 1259   GFRAA 56 (L) 01/22/2013 1546    COAGS: Lab Results  Component Value Date   INR 1.0 06/11/2022      Non-Invasive Vascular Imaging:   None this admission. Prior studies are available from June 14, 2022  Statin:  Yes.   Beta Blocker:  No. Aspirin:  Yes.   ACEI:  No. ARB:  No. CCB use:  Yes Other antiplatelets/anticoagulants:  Yes.   Plavix 75 MG QD   ASSESSMENT/PLAN: This is a 89 y.o. male who presented to the emergency department today after seeing Dr. Thresa Ross in clinic for follow up post left foot third toe amputation.  On presentation today the patient now has gangrene to the second toe of the left foot and an odorous infection to the prior third toe amputation.  Patient's left lower extremity is warm to touch and painful on palpation with weak palpable posttibial pulses.  Plan:  Vascular surgery to take the patient to the vascular lab for a left lower extremity angiogram to reassess vascular flow to the patient's left leg.  I discussed the procedure, benefits, risks, and complications with the patient.  He verbalizes understanding.  I answered all his questions today.  He would like to proceed with the procedure as soon as possible.  Patient will be admitted to the hospital and made n.p.o. after midnight for angiogram tomorrow.   -Case discussed with Dr. [name] MD and he is in agreement with the plan   Drema Pry [facility] 07/12/2022 11:59 AM

## 2022-07-12 NOTE — ED Notes (Signed)
Pt has necrotic toe to L second toe.. Examined with EDP. Appears black. Pt has been in pain and has seen podiatrist.

## 2022-07-12 NOTE — Assessment & Plan Note (Addendum)
-   Serum creatinine elevation, does not meet criteria for acute kidney injury, presumed secondary to poor p.o. intake - Serum creatinine ranges 1.03-1.29 - Sodium chloride IVF at 100 mL/h, 1 day ordered - Check BMP in a.m.

## 2022-07-12 NOTE — Consult Note (Signed)
Pharmacy Antibiotic Note  Charles Jennings is a 89 y.o. male with PMH including diabetes, lower limb ischemia, left third toe amputation 06/30/22 admitted on 07/12/2022 with  gangrenous changes to second toe on left foot with cellulitis .  Pharmacy has been consulted for vancomycin and cefepime dosing. Patient is also ordered metronidazole.  Plan:  Cefepime 2 g IV q12h  Vancomycin 1.5 g IV LD followed by maintenance regimen of vancomycin 750 mg IV q24h --Calculated AUC: 558, Cmin 16.4 --Daily Scr per protocol; Scr of 1.36 today appears slightly higher than recent admission. If Scr remains stable or increases tomorrow will consider dose reduction --Levels at steady state or as clinically indicated  Height: '5\' 7"'$  (170.2 cm) Weight: 62.5 kg (137 lb 12.6 oz) IBW/kg (Calculated) : 66.1  Temp (24hrs), Avg:97.6 F (36.4 C), Min:97.5 F (36.4 C), Max:97.6 F (36.4 C)  Recent Labs  Lab 07/12/22 1130 07/12/22 1200  WBC 16.8*  --   CREATININE 1.36*  --   LATICACIDVEN  --  1.8    Estimated Creatinine Clearance: 30.6 mL/min (A) (by C-G formula based on SCr of 1.36 mg/dL (H)).    No Known Allergies  Antimicrobials this admission: Cefepime 2/7 >>  Vancomycin 2/7 >>  Metronidazole 2/7 >>   Dose adjustments this admission: N/A  Microbiology results: 2/7 BCx: pending  Thank you for allowing pharmacy to be a part of this patient's care.  Benita Gutter 07/12/2022 1:16 PM

## 2022-07-12 NOTE — Assessment & Plan Note (Signed)
Patient need on the floor for 1 day due to extreme weakness, per daughter - Check CK, B12 level - PT, OT ordered

## 2022-07-12 NOTE — Consult Note (Signed)
Hospital Consult    Reason for Consult:  Gangrene to Left second  toe Requesting Physician:  Dr Sharlotte Alamo MD MRN #:  [id number]  History of Present Illness: This is a 89 y.o. male Charles Jennings is a 89 y.o. male with a history of diabetes, lower limb ischemia amputation sent in for evaluation by podiatrist.  Review of record demonstrates on June 14, 2022 patient had Bilateral iliac stents placed and on January 26 patient had amputation to the left third toe.  Sent in today for gangrenous changes to left second toe and infection to third toe amputation site.  On exam he is resting comfortably in the ED stretcher. He states this all started a couple of weeks ago. He states it started to get painful about a week ago. He knew he had an appointment to follow up with Dr Cleda Mccreedy today so he waited to see him rather than come to the emergency department.  He has no other complaints at this time and his vitals all remain stable.   Past Medical History:  Diagnosis Date   (HFpEF) heart failure with preserved ejection fraction (HCC)    Arthritis    Complete heart block ([place])    a.) s/p dual chamber Medtronic PPM placement 01/31/2016   Frequent PVCs    Hemorrhoids    a.) s/p banding 01/2021   HLD (hyperlipidemia)    Hypertension    Long term current use of antithrombotics/antiplatelets    a.) on DAPT (ASA + clopidogrel)   Peripheral vascular disease ([place])    Presence of permanent cardiac pacemaker 01/31/2016   a.) s/p Advisa DR MRI SureScan dual chamber PPM (SN: [id number])   SDH (subdural hematoma) (HCC)    Skin cancer    Type 2 diabetes mellitus treated with insulin [facility])     Past Surgical History:  Procedure Laterality Date   AMPUTATION TOE Left 06/30/2022   Procedure: AMPUTATION TOE - [place];  Surgeon: Sharlotte Alamo, DPM;  Location: [facility] ORS;  Service: Podiatry;  Laterality: Left;   EYE SURGERY     FEMORAL ENDARTERECTOMY Bilateral    HEMORRHOID BANDING N/A  01/2021   HERNIA REPAIR     double hernia   LOWER EXTREMITY ANGIOGRAPHY Left 06/12/2022   Procedure: Lower Extremity Angiography;  Surgeon: Algernon Huxley, MD;  Location: [place] [facility];  Service: Cardiovascular;  Laterality: Left;   LOWER EXTREMITY INTERVENTION Bilateral 06/14/2022   Procedure: LOWER EXTREMITY INTERVENTION;  Surgeon: Algernon Huxley, MD;  Location: [place] [facility];  Service: Cardiovascular;  Laterality: Bilateral;   PACEMAKER INSERTION Left 01/31/2016   Procedure: PACEMAKER INSERTION; Location: [facility]; Surgeon: Dimas Alexandria, MD    No Known Allergies  Prior to Admission medications   Medication Sig Start Date End Date Taking? Authorizing Provider  ASPIR-LOW 81 MG EC tablet Take 81 mg by mouth daily. 11/30/15   [provider]  atorvastatin (LIPITOR) 40 MG tablet Take 1 tablet (40 mg total) by mouth every evening. 06/15/22 07/15/22  Richarda Osmond, MD  clopidogrel (PLAVIX) 75 MG tablet Take 1 tablet (75 mg total) by mouth daily. 06/16/22   Richarda Osmond, MD  diltiazem (CARDIZEM CD) 240 MG 24 hr capsule Take 240 mg by mouth daily.    [provider]  gabapentin (NEURONTIN) 300 MG capsule Take 300 mg by mouth daily.    [provider]  HYDROcodone-acetaminophen (NORCO/VICODIN) 5-325 MG tablet Take 1 tablet by mouth every 4 (four) hours as needed  for moderate pain.    [provider]  JARDIANCE 10 MG TABS tablet Take 10 mg by mouth daily.    [provider]  lisinopril (ZESTRIL) 40 MG tablet Take 40 mg by mouth daily.    [provider]  loratadine (CLARITIN) 10 MG tablet Take 1 tablet by mouth daily. 11/30/15   [provider]  metFORMIN (GLUCOPHAGE) 500 MG tablet Take 500 mg by mouth 2 (two) times daily with a meal. 11/30/15   [provider]  NOVOLIN 70/30 (70-30) 100 UNIT/ML injection Inject 10 Units into the skin daily with breakfast. Patient taking differently: Inject into the skin  daily with breakfast. Inject subcutaneously once daily. Sliding scale 06/15/22   Richarda Osmond, MD  prednisoLONE acetate (PRED FORTE) 1 % ophthalmic suspension Place 1 drop into both eyes 2 (two) times daily.    [provider]    Social History   Socioeconomic History   Marital status: Widowed    Spouse name: Not on file   Number of children: Not on file   Years of education: Not on file   Highest education level: Not on file  Occupational History   Not on file  Tobacco Use   Smoking status: Former    Types: Cigarettes   Smokeless tobacco: Never  Substance and Sexual Activity   Alcohol use: Not Currently   Drug use: Never   Sexual activity: Not on file  Other Topics Concern   Not on file  Social History Narrative   Lives alone   Social Determinants of Health   Financial Resource Strain: Not on file  Food Insecurity: No Food Insecurity (06/12/2022)   Hunger Vital Sign    Worried About Running Out of Food in the Last Year: Never true    Ran Out of Food in the Last Year: Never true  Transportation Needs: No Transportation Needs (06/12/2022)   PRAPARE - Hydrologist (Medical): No    Lack of Transportation (Non-Medical): No  Physical Activity: Not on file  Stress: Not on file  Social Connections: Not on file  Intimate Partner Violence: Not At Risk (06/12/2022)   Humiliation, Afraid, Rape, and Kick questionnaire    Fear of Current or Ex-Partner: No    Emotionally Abused: No    Physically Abused: No    Sexually Abused: No     No family history on file.  ROS: Otherwise negative unless mentioned in HPI  Physical Examination  Vitals:   07/12/22 1101 07/12/22 1116  BP: 115/70 (!) 165/67  Pulse: 92 75  Resp: 18 18  Temp: (!) 97.5 F (36.4 C) 97.6 F (36.4 C)  SpO2: 100% 96%   Body mass index is 21.58 kg/m.  General:  WDWN in NAD Gait: Not observed HENT: WNL, normocephalic Pulmonary: normal non-labored breathing, without  Rales, rhonchi,  wheezing Cardiac: regular, with permanent Yeiden Frenkel maker, without  Murmurs, rubs or gallops; without carotid bruits Abdomen: Positive Bowel sounds, soft, flat, NT/ND, no masses Skin: without rashes Vascular Exam/Pulses: Left lower extremity with gangrene of the second toe and an odorous infection of prior 3rd toe amputation site.  Extremities: with ischemic changes, with Gangrene , with cellulitis; without open wounds;  Musculoskeletal: no muscle wasting or atrophy  Neurologic: A&O X 3;  No focal weakness or paresthesias are detected; speech is fluent/normal Psychiatric:  The pt has Normal affect. Lymph:  Unremarkable  CBC    Component Value Date/Time   WBC 16.8 (H) 07/12/2022  1130   RBC 5.88 (H) 07/12/2022 1130   HGB 17.1 (H) 07/12/2022 1130   HGB 15.5 01/22/2013 1546   HCT 51.7 07/12/2022 1130   HCT 45.4 01/22/2013 1546   PLT 211 07/12/2022 1130   PLT 203 01/22/2013 1546   MCV 87.9 07/12/2022 1130   MCV 88 01/22/2013 1546   MCH 29.1 07/12/2022 1130   MCHC 33.1 07/12/2022 1130   RDW 13.3 07/12/2022 1130   RDW 13.9 01/22/2013 1546   LYMPHSABS 1.5 07/12/2022 1130   MONOABS 1.2 (H) 07/12/2022 1130   EOSABS 0.0 07/12/2022 1130   BASOSABS 0.1 07/12/2022 1130    BMET    Component Value Date/Time   NA 131 (L) 07/12/2022 1130   NA 137 01/22/2013 1546   K 4.5 07/12/2022 1130   K 3.9 01/22/2013 1546   CL 90 (L) 07/12/2022 1130   CL 104 01/22/2013 1546   CO2 22 07/12/2022 1130   CO2 28 01/22/2013 1546   GLUCOSE 242 (H) 07/12/2022 1130   GLUCOSE 211 (H) 01/22/2013 1546   BUN 58 (H) 07/12/2022 1130   BUN 18 01/22/2013 1546   CREATININE 1.36 (H) 07/12/2022 1130   CREATININE 1.35 (H) 01/22/2013 1546   CALCIUM 9.3 07/12/2022 1130   CALCIUM 8.9 01/22/2013 1546   GFRNONAA 49 (L) 07/12/2022 1130   GFRNONAA 48 (L) 01/22/2013 1546   GFRAA >60 01/28/2016 1259   GFRAA 56 (L) 01/22/2013 1546    COAGS: Lab Results  Component Value Date   INR 1.0 06/11/2022      Non-Invasive Vascular Imaging:   None this admission. Prior studies are available from June 14, 2022  Statin:  Yes.   Beta Blocker:  No. Aspirin:  Yes.   ACEI:  No. ARB:  No. CCB use:  Yes Other antiplatelets/anticoagulants:  Yes.   Plavix 75 MG QD   ASSESSMENT/PLAN: This is a 89 y.o. male who presented to the emergency department today after seeing Dr. Thresa Ross in clinic for follow up post left foot third toe amputation.  On presentation today the patient now has gangrene to the second toe of the left foot and an odorous infection to the prior third toe amputation.  Patient's left lower extremity is warm to touch and painful on palpation with weak palpable posttibial pulses.  Plan:  Vascular surgery to take the patient to the vascular lab for a left lower extremity angiogram to reassess vascular flow to the patient's left leg.  I discussed the procedure, benefits, risks, and complications with the patient.  He verbalizes understanding.  I answered all his questions today.  He would like to proceed with the procedure as soon as possible.  Patient will be admitted to the hospital and made n.p.o. after midnight for angiogram tomorrow.   -Case discussed with Dr. [name] MD and he is in agreement with the plan   Drema Pry [facility] 07/12/2022 11:59 AM

## 2022-07-12 NOTE — H&P (Addendum)
History and Physical   RAVI TUCCILLO POE:[id number] DOB: Feb 04, 1928 DOA: 07/12/2022  PCP: Albina Billet, MD  Outpatient Specialists: Cleda Mccreedy, podiatry Patient coming from: [facility]  I have personally briefly reviewed patient's old medical records in [place].  Chief Concern:   HPI: Mr. 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  Initial vitals in the ED showed temperature of 97.6, respiration rate of 18, heart rate 76, blood pressure 165/67, SpO2 of 96% on room air.  Serum sodium is 131, potassium 4.5, chloride 90, bicarb 22, BUN of 53, serum creatinine 1.36, nonfasting blood glucose 242, EGFR 49, WBC 16.8, hemoglobin 17.1, platelets of 211.  Anion gap was elevated at 19.  Alk phos elevated at 212.  ED treatment: Cefepime, vancomycin, metronidazole. ---------------------------------- At bedside, he is able to tell me his name, age, current calendar year.   He reports he saw Dr. Cleda Mccreedy, and was advised to come to the ED. He reports the left foot has been bothering him for over one year. He states this was a regular scheduled visit.   He denies fever, cough.   Social history: He is retired and formerly worked for [employer] and then [employer]. He denies tobacco, etoh, recreational drug use. He lives on his own and performs his own adl.  ROS: Constitutional: no weight change, no fever ENT/Mouth: no sore throat, no rhinorrhea Eyes: no eye pain, no vision changes Cardiovascular: no chest pain, no dyspnea,  no edema, no palpitations Respiratory: no cough, no sputum, no wheezing Gastrointestinal: no nausea, no vomiting, no diarrhea, no constipation Genitourinary: no urinary incontinence, no dysuria, no hematuria Musculoskeletal: no  arthralgias, no myalgias Skin: no skin lesions, no pruritus, + left toe lesion Neuro: + weakness, no loss of consciousness, no syncope Psych: no anxiety, no depression, + decrease appetite Heme/Lymph: no bruising, no bleeding  ED Course: Discussed with emergency medicine provider, patient requiring hospitalization for chief concerns of osteomyelitis of the left second toe.  Assessment/Plan  Principal Problem:   Osteomyelitis (HCC) Active Problems:   Limb ischemia   Insulin dependent type 2 diabetes mellitus (HCC)   Mixed hyperlipidemia   Benign essential HTN   Stage 3a chronic kidney disease (CKD) (HCC)   Weakness   Assessment and Plan:  * Osteomyelitis (HCC) Left third toe osteomyelitis With possible cellulitis at the site of left foot amputation - Continue with vancomycin and cefepime - Blood cultures x 2 are in process, please follow-up for hematogenous spread - Sodium chloride 125 mL/h, 1 day ordered - Podiatry has been consulted, Dr. Cleda Mccreedy - Vascular consulted, n.p.o. plan for lower extremity angiogram tomorrow, 07/13/2022 - Admit to MedSurg, inpatient  Insulin dependent type 2 diabetes mellitus ([place]) - Home NovoLog 70/30, 10 units daily before breakfast not resumed on admission - Jardiance 10 mg daily, metformin 500 mg p.o. twice daily were not resumed on admission - Insulin SSI with at bedtime coverage ordered - Goal inpatient blood glucose levels 140-180  Mixed hyperlipidemia - Atorvastatin 40 mg daily  Benign essential HTN - Diltiazem 240 mg daily, lisinopril 40 mg daily resumed - Hydralazine 5 mg IV every 8 hours as needed for SBP greater than 175, 4 days ordered  Weakness Patient need on the floor for 1 day due to extreme weakness, per daughter - Check CK, B12 level -  PT, OT ordered  Stage 3a chronic kidney disease (CKD) (HCC) - Serum creatinine elevation, does not meet criteria for acute kidney injury, presumed secondary to poor p.o. intake - Serum  creatinine ranges 1.03-1.29 - Sodium chloride IVF at 100 mL/h, 1 day ordered - Check BMP in a.m.  CODE STATUS discussion: If your heart were to stop beating, is it okay for [place] to perform chest compressions also known as CPR?  Patient response: 'No' If you can not breath on your own, is it okay for [place] to perform intubation, tube down your throat hooked up to machine to help breathe for you?  Patient response: 'Definitely not'  Patient: 'Just let me go'  Chart reviewed.   DVT prophylaxis: TED hose; pharmacologic DVT prophylaxis not initiated on admission due to anticipation of possible procedure intervention.  AM team to initiate pharmacologic DVT prophylaxis when the benefits outweigh the risk Code Status: DNR per patient and confirmed with daughter  Diet: Heart healthy now; n.p.o. after midnight Family Communication: Updated Gabriel Cirri, daughter at [phone].  Daughter lives in [place] and recently lost her husband and has 4 children.  She would like to know discharge planning regarding when she needs to come to [place] in order to assess her father. Disposition Plan: Pending vascular and podiatry evaluation Consults called: Podiatry, vascular Admission status: MedSurg, inpatient  Past Medical History:  Diagnosis Date   (HFpEF) heart failure with preserved ejection fraction ([place])    Arthritis    Complete heart block ([place])    a.) s/p dual chamber Medtronic PPM placement 01/31/2016   Frequent PVCs    Hemorrhoids    a.) s/p banding 01/2021   HLD (hyperlipidemia)    Hypertension    Long term current use of antithrombotics/antiplatelets    a.) on DAPT (ASA + clopidogrel)   Peripheral vascular disease ([place])    Presence of permanent cardiac pacemaker 01/31/2016   a.) s/p Advisa DR MRI SureScan dual chamber PPM (SN: [id number])   SDH (subdural hematoma) (HCC)    Skin cancer    Type 2 diabetes mellitus treated with insulin [facility])    Past Surgical History:  Procedure Laterality  Date   AMPUTATION TOE Left 06/30/2022   Procedure: AMPUTATION TOE - [place];  Surgeon: Sharlotte Alamo, DPM;  Location: [facility];  Service: Podiatry;  Laterality: Left;   EYE SURGERY     FEMORAL ENDARTERECTOMY Bilateral    HEMORRHOID BANDING N/A 01/2021   HERNIA REPAIR     double hernia   LOWER EXTREMITY ANGIOGRAPHY Left 06/12/2022   Procedure: Lower Extremity Angiography;  Surgeon: Algernon Huxley, MD;  Location: [place] [facility];  Service: Cardiovascular;  Laterality: Left;   LOWER EXTREMITY INTERVENTION Bilateral 06/14/2022   Procedure: LOWER EXTREMITY INTERVENTION;  Surgeon: Algernon Huxley, MD;  Location: [place] [facility];  Service: Cardiovascular;  Laterality: Bilateral;   PACEMAKER INSERTION Left 01/31/2016   Procedure: PACEMAKER INSERTION; Location: [facility]; Surgeon: Dimas Alexandria, MD   Social History:  reports that he has never smoked. He has never used smokeless tobacco. He reports that he does not currently use alcohol. He reports that he does not use drugs.  No Known Allergies Family History  Problem Relation Age of Onset   Liver disease Father    Hypertension Brother    Heart attack Brother    Alpha-1 antitrypsin deficiency Granddaughter    Family history: Family history reviewed and not pertinent.  Prior to Admission medications   Medication Sig  Start Date End Date Taking? Authorizing Provider  ASPIR-LOW 81 MG EC tablet Take 81 mg by mouth daily. 11/30/15   [provider]  atorvastatin (LIPITOR) 40 MG tablet Take 1 tablet (40 mg total) by mouth every evening. 06/15/22 07/15/22  Richarda Osmond, MD  clopidogrel (PLAVIX) 75 MG tablet Take 1 tablet (75 mg total) by mouth daily. 06/16/22   Richarda Osmond, MD  diltiazem (CARDIZEM CD) 240 MG 24 hr capsule Take 240 mg by mouth daily.    [provider]  gabapentin (NEURONTIN) 300 MG capsule Take 300 mg by mouth daily.    [provider]  HYDROcodone-acetaminophen  (NORCO/VICODIN) 5-325 MG tablet Take 1 tablet by mouth every 4 (four) hours as needed for moderate pain.    [provider]  JARDIANCE 10 MG TABS tablet Take 10 mg by mouth daily.    [provider]  lisinopril (ZESTRIL) 40 MG tablet Take 40 mg by mouth daily.    [provider]  loratadine (CLARITIN) 10 MG tablet Take 1 tablet by mouth daily. 11/30/15   [provider]  metFORMIN (GLUCOPHAGE) 500 MG tablet Take 500 mg by mouth 2 (two) times daily with a meal. 11/30/15   [provider]  NOVOLIN 70/30 (70-30) 100 UNIT/ML injection Inject 10 Units into the skin daily with breakfast. Patient taking differently: Inject into the skin daily with breakfast. Inject subcutaneously once daily. Sliding scale 06/15/22   Richarda Osmond, MD  prednisoLONE acetate (PRED FORTE) 1 % ophthalmic suspension Place 1 drop into both eyes 2 (two) times daily.    [provider]   Physical Exam: Vitals:   07/12/22 1116 07/12/22 1230 07/12/22 1400 07/12/22 1432  BP: (!) 165/67 (!) 149/70 136/66 (!) 174/65  Pulse: 75 75 71 (!) 7  Resp: '18 20 20 16  '$ Temp: 97.6 F (36.4 C)   (!) 97.5 F (36.4 C)  TempSrc: Oral   Oral  SpO2: 96% 96% 95% 100%  Weight:      Height:       Constitutional: appears age-appropriate, frail, NAD, calm, comfortable Eyes: PERRL, lids and conjunctivae normal ENMT: Mucous membranes are moist. Posterior pharynx clear of any exudate or lesions. Age-appropriate dentition. Hearing appropriate/ Neck: normal, supple, no masses, no thyromegaly Respiratory: clear to auscultation bilaterally, no wheezing, no crackles. Normal respiratory effort. No accessory muscle use.  Cardiovascular: Regular rate and rhythm, no murmurs / rubs / gallops. No extremity edema. 2+ pedal pulses. No carotid bruits.  Abdomen: no tenderness, no masses palpated, no hepatosplenomegaly. Bowel sounds positive.  Musculoskeletal: no clubbing / cyanosis. No joint deformity upper  and lower extremities. Good ROM, no contractures, no atrophy. Normal muscle tone.  Skin: Left toe wounds, clean dressing in place Neurologic: Sensation intact. Strength 5/5 in all 4.  Psychiatric: Normal judgment and insight. Alert and oriented x 3. Normal mood.   EKG: not indicated at this time Chest x-ray on Admission: Not indicated at this time Labs on Admission: I have personally reviewed following labs  CBC: Recent Labs  Lab 07/12/22 1130  WBC 16.8*  NEUTROABS 13.8*  HGB 17.1*  HCT 51.7  MCV 87.9  PLT 277   Basic Metabolic Panel: Recent Labs  Lab 07/12/22 1130  NA 131*  K 4.5  CL 90*  CO2 22  GLUCOSE 242*  BUN 58*  CREATININE 1.36*  CALCIUM 9.3   GFR: Estimated Creatinine Clearance: 30.6 mL/min (A) (by C-G formula based on SCr of 1.36 mg/dL (H)).  Liver Function Tests: Recent Labs  Lab 07/12/22 1130  AST 31  ALT 27  ALKPHOS 212*  BILITOT 1.9*  PROT 6.9  ALBUMIN 3.2*   Coagulation Profile: Recent Labs  Lab 07/12/22 1201  INR 1.1   Urine analysis:    Component Value Date/Time   COLORURINE Straw 01/22/2013 1826   APPEARANCEUR Clear 01/22/2013 1826   LABSPEC 1.005 01/22/2013 1826   PHURINE 6.0 01/22/2013 1826   GLUCOSEU 50 mg/dL 01/22/2013 1826   HGBUR Negative 01/22/2013 1826   BILIRUBINUR Negative 01/22/2013 1826   KETONESUR 1+ 01/22/2013 1826   PROTEINUR Negative 01/22/2013 1826   NITRITE Negative 01/22/2013 1826   LEUKOCYTESUR Negative 01/22/2013 1826   This document was prepared using Dragon Voice Recognition software and may include unintentional dictation errors.  Dr. Tobie Poet [facility]  If 7PM-7AM, please contact overnight-coverage provider If 7AM-7PM, please contact day coverage provider [email or web address]  07/12/2022, 5:38 PM

## 2022-07-12 NOTE — ED Notes (Signed)
Refusing blood work at this time.  Wishes to wait for IV to be started.

## 2022-07-12 NOTE — Assessment & Plan Note (Addendum)
-   Home NovoLog 70/30, 10 units daily before breakfast not resumed on admission - Jardiance 10 mg daily, metformin 500 mg p.o. twice daily were not resumed on admission - Insulin SSI with at bedtime coverage ordered - Goal inpatient blood glucose levels 140-180

## 2022-07-12 NOTE — Consult Note (Signed)
PHARMACY -  BRIEF ANTIBIOTIC NOTE   Pharmacy has received consult(s) for cefepime and vancomycin from an ED provider. Patient is also ordered metronidazole. [name] profile has been reviewed for ht/wt/allergies/indication/available labs.    One time order(s) placed for  --Cefepime 2 g IV --Vancomycin 1.5 g IV  Further antibiotics/pharmacy consults should be ordered by admitting physician if indicated.                       Thank you, Benita Gutter 07/12/2022  12:03 PM

## 2022-07-12 NOTE — ED Notes (Signed)
Informed RN bed assigned 

## 2022-07-12 NOTE — Assessment & Plan Note (Signed)
-   Atorvastatin 40 mg daily

## 2022-07-12 NOTE — ED Triage Notes (Signed)
Arrives from [facility] for admission to hospital.  Patient had left third toe amputated two weeks ago.  Sent for admission for amputation of second or fourth toe on left, patient is unsure.  Patient and family state that patient lives alone and has had multiple falls since discharge form hospital. Concerned that patient may need additional assistance in the home  Patient is AAOx3.  Skin warm and dry. NAD

## 2022-07-13 ENCOUNTER — Encounter
Admission: EM | Disposition: A | Discharge status: Skilled Nursing Facility | Payer: Self-pay | Source: Ambulatory Visit | Attending: Student

## 2022-07-13 ENCOUNTER — Inpatient Hospital Stay: Payer: Medicare Other

## 2022-07-13 DIAGNOSIS — I70262 Atherosclerosis of native arteries of extremities with gangrene, left leg: Secondary | ICD-10-CM | POA: Diagnosis not present

## 2022-07-13 DIAGNOSIS — Z9889 Other specified postprocedural states: Secondary | ICD-10-CM

## 2022-07-13 DIAGNOSIS — M869 Osteomyelitis, unspecified: Secondary | ICD-10-CM | POA: Diagnosis not present

## 2022-07-13 DIAGNOSIS — Z95828 Presence of other vascular implants and grafts: Secondary | ICD-10-CM | POA: Diagnosis not present

## 2022-07-13 DIAGNOSIS — I743 Embolism and thrombosis of arteries of the lower extremities: Secondary | ICD-10-CM | POA: Diagnosis not present

## 2022-07-13 HISTORY — PX: LOWER EXTREMITY ANGIOGRAPHY: CATH118251

## 2022-07-13 LAB — GLUCOSE, CAPILLARY
Glucose-Capillary: 124 mg/dL — ABNORMAL HIGH (ref 70–99)
Glucose-Capillary: 88 mg/dL (ref 70–99)
Glucose-Capillary: 103 mg/dL — ABNORMAL HIGH (ref 70–99)
Glucose-Capillary: 144 mg/dL — ABNORMAL HIGH (ref 70–99)

## 2022-07-13 LAB — FIBRINOGEN: Fibrinogen: 360 mg/dL (ref 210–475)

## 2022-07-13 LAB — CBC
HCT: 42.8 % (ref 39.0–52.0)
HCT: 42.1 % (ref 39.0–52.0)
Hemoglobin: 14.4 g/dL (ref 13.0–17.0)
Hemoglobin: 13.9 g/dL (ref 13.0–17.0)
MCH: 29.3 pg (ref 26.0–34.0)
MCH: 29 pg (ref 26.0–34.0)
MCHC: 33.6 g/dL (ref 30.0–36.0)
MCHC: 33 g/dL (ref 30.0–36.0)
MCV: 87.2 fL (ref 80.0–100.0)
MCV: 87.9 fL (ref 80.0–100.0)
Platelets: 166 10*3/uL (ref 150–400)
Platelets: 188 10*3/uL (ref 150–400)
RBC: 4.91 MIL/uL (ref 4.22–5.81)
RBC: 4.79 MIL/uL (ref 4.22–5.81)
RDW: 13.3 % (ref 11.5–15.5)
RDW: 13.3 % (ref 11.5–15.5)
WBC: 11.1 10*3/uL — ABNORMAL HIGH (ref 4.0–10.5)
WBC: 12.7 10*3/uL — ABNORMAL HIGH (ref 4.0–10.5)
nRBC: 0 % (ref 0.0–0.2)
nRBC: 0 % (ref 0.0–0.2)

## 2022-07-13 LAB — VITAMIN B12: Vitamin B-12: 563 pg/mL (ref 180–914)

## 2022-07-13 LAB — BASIC METABOLIC PANEL
Anion gap: 7 (ref 5–15)
BUN: 40 mg/dL — ABNORMAL HIGH (ref 8–23)
CO2: 25 mmol/L (ref 22–32)
Calcium: 8.2 mg/dL — ABNORMAL LOW (ref 8.9–10.3)
Chloride: 100 mmol/L (ref 98–111)
Creatinine, Ser: 0.97 mg/dL (ref 0.61–1.24)
GFR, Estimated: >60 mL/min (ref >60)
Glucose, Bld: 139 mg/dL — ABNORMAL HIGH (ref 70–99)
Potassium: 5.1 mmol/L (ref 3.5–5.1)
Sodium: 132 mmol/L — ABNORMAL LOW (ref 135–145)

## 2022-07-13 LAB — MAGNESIUM: Magnesium: 1.7 mg/dL (ref 1.7–2.4)

## 2022-07-13 LAB — OSMOLALITY: Osmolality: 289 mOsm/kg (ref 275–295)

## 2022-07-13 LAB — PHOSPHORUS: Phosphorus: 2.3 mg/dL — ABNORMAL LOW (ref 2.5–4.6)

## 2022-07-13 LAB — TSH: TSH: 3.174 u[IU]/mL (ref 0.350–4.500)

## 2022-07-13 LAB — MRSA NEXT GEN BY PCR, NASAL: MRSA by PCR Next Gen: NOT DETECTED

## 2022-07-13 SURGERY — LOWER EXTREMITY ANGIOGRAPHY
Anesthesia: Moderate Sedation | Laterality: Left

## 2022-07-13 MED ORDER — METHYLPREDNISOLONE SODIUM SUCC 125 MG IJ SOLR: 125.0000 mg | Freq: Once | INTRAMUSCULAR | Status: DC | PRN

## 2022-07-13 MED ORDER — SODIUM CHLORIDE 0.9 % IV SOLN
0.5000 mg/h | INTRAVENOUS | Status: DC
  Administered 2022-07-13: 0.5 mg/h via INTRA_ARTERIAL
  Filled 2022-07-13 (×5): qty 10

## 2022-07-13 MED ORDER — ALTEPLASE 1 MG/ML SYRINGE FOR VASCULAR PROCEDURE
INTRAMUSCULAR | Status: DC | PRN
  Administered 2022-07-13: 6 mg via INTRA_ARTERIAL

## 2022-07-13 MED ORDER — MORPHINE SULFATE (PF) 2 MG/ML IV SOLN
1.0000 mg | Freq: Once | INTRAVENOUS | Status: AC
  Administered 2022-07-13: 1 mg via INTRAVENOUS

## 2022-07-13 MED ORDER — SODIUM CHLORIDE 0.9 % IV SOLN: INTRAVENOUS | Status: DC

## 2022-07-13 MED ORDER — ONDANSETRON HCL 4 MG/2ML IJ SOLN
4.0000 mg | Freq: Four times a day (QID) | INTRAMUSCULAR | Status: DC | PRN
  Administered 2022-07-15: 4 mg via INTRAVENOUS

## 2022-07-13 MED ORDER — SODIUM CHLORIDE 0.9 % IV BOLUS
INTRAVENOUS | Status: DC | PRN
  Administered 2022-07-13 (×2): 250 mL via INTRAVENOUS

## 2022-07-13 MED ORDER — ALTEPLASE 2 MG IJ SOLR
INTRAMUSCULAR | Status: AC
  Filled 2022-07-13: qty 6

## 2022-07-13 MED ORDER — HYDROMORPHONE HCL 1 MG/ML IJ SOLN
1.0000 mg | Freq: Once | INTRAMUSCULAR | Status: AC | PRN
  Administered 2022-07-19: 1 mg via INTRAVENOUS
  Filled 2022-07-13: qty 1

## 2022-07-13 MED ORDER — DIPHENHYDRAMINE HCL 50 MG/ML IJ SOLN: 50.0000 mg | Freq: Once | INTRAMUSCULAR | Status: DC | PRN

## 2022-07-13 MED ORDER — MIDAZOLAM HCL 2 MG/ML PO SYRP: 8.0000 mg | ORAL_SOLUTION | Freq: Once | ORAL | Status: DC | PRN

## 2022-07-13 MED ORDER — MORPHINE SULFATE (PF) 2 MG/ML IV SOLN
INTRAVENOUS | Status: AC
  Filled 2022-07-13: qty 1

## 2022-07-13 MED ORDER — HEPARIN (PORCINE) 25000 UT/250ML-% IV SOLN
INTRAVENOUS | Status: AC
  Administered 2022-07-13: 600 [IU]/h via INTRA_ARTERIAL
  Filled 2022-07-13: qty 250

## 2022-07-13 MED ORDER — FENTANYL CITRATE PF 50 MCG/ML IJ SOSY
12.5000 ug | PREFILLED_SYRINGE | Freq: Once | INTRAMUSCULAR | Status: AC | PRN
  Administered 2022-07-16: 12.5 ug via INTRAVENOUS
  Filled 2022-07-13: qty 1

## 2022-07-13 MED ORDER — HEPARIN (PORCINE) 25000 UT/250ML-% IV SOLN: 600.0000 [IU]/h | INTRAVENOUS | Status: DC

## 2022-07-13 MED ORDER — MIDAZOLAM HCL 2 MG/2ML IJ SOLN
INTRAMUSCULAR | Status: DC | PRN
  Administered 2022-07-13: 1 mg via INTRAVENOUS

## 2022-07-13 MED ORDER — MIDAZOLAM HCL 2 MG/2ML IJ SOLN
INTRAMUSCULAR | Status: AC
  Filled 2022-07-13: qty 2

## 2022-07-13 MED ORDER — CHLORHEXIDINE GLUCONATE CLOTH 2 % EX PADS: 6.0000 | MEDICATED_PAD | Freq: Once | CUTANEOUS | Status: DC

## 2022-07-13 MED ORDER — FENTANYL CITRATE (PF) 100 MCG/2ML IJ SOLN
INTRAMUSCULAR | Status: DC | PRN
  Administered 2022-07-13: 25 ug via INTRAVENOUS
  Administered 2022-07-13: 12.5 ug via INTRAVENOUS

## 2022-07-13 MED ORDER — HEPARIN SODIUM (PORCINE) 1000 UNIT/ML IJ SOLN
INTRAMUSCULAR | Status: DC | PRN
  Administered 2022-07-13: 4000 [IU] via INTRAVENOUS

## 2022-07-13 MED ORDER — FAMOTIDINE 20 MG PO TABS: 40.0000 mg | ORAL_TABLET | Freq: Once | ORAL | Status: DC | PRN

## 2022-07-13 MED ORDER — CEFAZOLIN SODIUM-DEXTROSE 2-4 GM/100ML-% IV SOLN
INTRAVENOUS | Status: AC
  Administered 2022-07-13: 2 g via INTRAVENOUS
  Filled 2022-07-13: qty 100

## 2022-07-13 MED ORDER — IODIXANOL 320 MG/ML IV SOLN
INTRAVENOUS | Status: DC | PRN
  Administered 2022-07-13: 55 mL via INTRA_ARTERIAL

## 2022-07-13 MED ORDER — FENTANYL CITRATE PF 50 MCG/ML IJ SOSY
PREFILLED_SYRINGE | INTRAMUSCULAR | Status: AC
  Filled 2022-07-13: qty 1

## 2022-07-13 MED ORDER — CHLORHEXIDINE GLUCONATE CLOTH 2 % EX PADS
6.0000 | MEDICATED_PAD | Freq: Every day | CUTANEOUS | Status: DC
  Administered 2022-07-13 – 2022-07-21 (×7): 6 via TOPICAL

## 2022-07-13 MED ORDER — CEFAZOLIN SODIUM-DEXTROSE 2-4 GM/100ML-% IV SOLN
2.0000 g | INTRAVENOUS | Status: AC
  Filled 2022-07-13: qty 100

## 2022-07-13 MED ORDER — HEPARIN SODIUM (PORCINE) 1000 UNIT/ML IJ SOLN
INTRAMUSCULAR | Status: AC
  Filled 2022-07-13: qty 10

## 2022-07-13 SURGICAL SUPPLY — 17 items
CATH ANGIO 5F PIGTAIL 65CM (CATHETERS) IMPLANT
CATH BEACON 5 .035 65 KMP TIP (CATHETERS) IMPLANT
CATH INFUS 135CMX50CM (CATHETERS) IMPLANT
CATH NAVICROSS ANGLED 135CM (MICROCATHETER) IMPLANT
CATH ROTAREX 135 6FR (CATHETERS) IMPLANT
GLIDEWIRE ADV .035X260CM (WIRE) IMPLANT
KIT CV MULTILUMEN 7FR 20 (SET/KITS/TRAYS/PACK) ×1
KIT CV MULTILUMEN 7FR 20 SUB (SET/KITS/TRAYS/PACK) IMPLANT
KIT ENCORE 26 ADVANTAGE (KITS) IMPLANT
PACK ANGIOGRAPHY (CUSTOM PROCEDURE TRAY) ×1 IMPLANT
SHEATH BRITE TIP 5FRX11 (SHEATH) IMPLANT
SHEATH PINNACLE MP 6F 45CM (SHEATH) IMPLANT
SUT PROLENE 0 CT 1 30 (SUTURE) IMPLANT
SYR MEDRAD MARK 7 150ML (SYRINGE) IMPLANT
TUBING CONTRAST HIGH PRESS 72 (TUBING) IMPLANT
WIRE G V18X300CM (WIRE) IMPLANT
WIRE GUIDERIGHT .035X150 (WIRE) IMPLANT

## 2022-07-13 NOTE — Op Note (Signed)
[facility]  Percutaneous Study/Intervention Procedural Note   Date of Surgery: 07/13/2022  Surgeon(s):Brantleigh Mifflin    Assistants:none  Pre-operative Diagnosis: PAD with gangrenous changes to the left foot, acute on chronic ischemia  Post-operative diagnosis:  Same  Procedure(s) Performed:             1.  Ultrasound guidance for vascular access right femoral artery             2.  Catheter placement into left tibioperoneal trunk right femoral approach             3.  Aortogram and selective left lower extremity angiogram             4.  Mechanical thrombectomy of the left SFA, popliteal artery, and tibioperoneal trunk with the R[place] Rex device             5.  Catheter directed thrombolytic therapy using a 135 cm total length 50 cm working length catheter with 6 mg of tPA instilled at the time of the procedure and a continuous tPA infusion to start overnight  6.  Right femoral venous triple-lumen catheter placement with ultrasound guidance            EBL: 100 cc  Contrast: 55 cc  Fluoro Time: 6.6 minutes  Moderate Conscious Sedation Time: approximately 51 minutes using 1 mg of Versed and 37.5 mcg of Fentanyl              Indications:  Patient is a 89 y.o.male with severe peripheral arterial disease status post revascularization about a month ago with recurrent ischemia and gangrenous changes to the left foot. The patient is brought in for angiography for further evaluation and potential treatment.  Due to the limb threatening nature of the situation, angiogram was performed for attempted limb salvage. The patient is aware that if the procedure fails, amputation would be expected.  The patient also understands that even with successful revascularization, amputation may still be required due to the severity of the situation.  Risks and benefits are discussed and informed consent is obtained.   Procedure:  The patient was identified and appropriate procedural time out  was performed.  The patient was then placed supine on the table and prepped and draped in the usual sterile fashion. Moderate conscious sedation was administered during a face to face encounter with the patient throughout the procedure with my supervision of the RN administering medicines and monitoring the patient's vital signs, pulse oximetry, telemetry and mental status throughout from the start of the procedure until the patient was taken to the recovery room. Ultrasound was used to evaluate the right common femoral artery.  It was patent .  A digital ultrasound image was acquired.  A Seldinger needle was used to access the right common femoral artery under direct ultrasound guidance and a permanent image was performed.  A 0.035 J wire was advanced without resistance and a 5Fr sheath was placed.  Pigtail catheter was placed into the aorta and an AP aortogram was performed. This demonstrated normal renal arteries and normal aorta and iliac segments without significant stenosis after previous stent placement bilaterally.  I then crossed the aortic bifurcation and advanced to the left femoral head. Selective left lower extremity angiogram was then performed. This demonstrated normal renal arteries and normal aorta and iliac segments without significant stenosis after previous stent placement bilaterally.. It was felt that it was in the patient's best interest to proceed with intervention after these  images to avoid a second procedure and a larger amount of contrast and fluoroscopy based off of the findings from the initial angiogram. The patient was systemically heparinized and a 6 [place] [employer] sheath was then placed over the [employer] wire. I then used a Kumpe catheter and the advantage wire to get into the occluded SFA and cross this without difficulty with a Navacross catheter and the advantage wire.  The catheter was in the tibioperoneal trunk which had thrombus present.  There was three-vessel  runoff after the occlusions proximally.  I then placed a V18 wire into the peroneal artery.  2 passes were made with the [place] Rex device to perform mechanical thrombectomy of the left SFA, popliteal artery, and tibioperoneal trunk.  Following thrombectomy, there is now a channel of flow but a large amount of residual thrombus remain throughout the SFA, popliteal artery, and tibioperoneal trunk and I felt our best chance at patency and limb salvage would be with a continuous infusion of thrombolytic therapy.  Given his advanced age, we will give him a 0.5 mg/h drip of tPA and I went ahead and instilled 6 mg of tPA after placing a 135 cm total length 50 cm working length catheter with the distal tip in the tibioperoneal trunk and the proximal portion of the common femoral artery.  The sheath and the lysis catheter were secured in place with a Prolene suture.  A central line was then placed in the right femoral vein.  The right femoral vein was visualized with ultrasound and found to be widely patent and accessed under direct ultrasound guidance without difficulty with a Seldinger needle.  A permanent image was recorded.  A J-wire was placed.  After skin nick and dilatation a triple-lumen catheter was placed over the wire.  All 3 lm withdrew dark red nonpulsatile blood and flushed easily with sterile saline and it was secured to the skin with 2 silk sutures. I elected to terminate the procedure. The patient was taken to the recovery room in stable condition having tolerated the procedure well.  Findings:               Aortogram:  This demonstrated normal renal arteries and normal aorta and iliac segments without significant stenosis after previous stent placement bilaterally.             Left Lower Extremity:  This demonstrated flush occlusion of the left SFA above the previously placed stents.  There was some nonocclusive thrombus in the distal aspect of the profunda femoris artery but the common femoral artery  profunda femoris artery was otherwise patent.   Disposition: Patient was taken to the recovery room in stable condition having tolerated the procedure well.  Complications: None  [name] 07/13/2022 6:05 PM   This note was created with Dragon Medical transcription system. Any errors in dictation are purely unintentional.

## 2022-07-13 NOTE — Progress Notes (Signed)
Daily Progress Note   Subjective  - * No surgery date entered *  Left second toe gangrene Infection left third toe amputation site.  Patient resting comfortably.  No complaints today.  Objective Vitals:   07/12/22 1917 07/12/22 1917 07/13/22 0433 07/13/22 0740  BP: (!) 108/57 (!) 108/57 134/70 (!) 137/56  Pulse: 74 74 79 65  Resp: '18 18 17 16  '$ Temp: 98 F (36.7 C) 98 F (36.7 C) (!) 97.5 F (36.4 C)   TempSrc:  Oral Oral   SpO2:  95% 98% 94%  Weight:      Height:        Physical Exam: Obvious gangrenous changes to the distal aspect of the left second toe.  Necrotic tissue at the amputation site of the left third toe with erythema along the proximal aspect of this region.  Concerning for abscess and residual infection at the amputation site.  Laboratory CBC    Component Value Date/Time   WBC 11.1 (H) 07/13/2022 0504   HGB 14.4 07/13/2022 0504   HGB 15.5 01/22/2013 1546   HCT 42.8 07/13/2022 0504   HCT 45.4 01/22/2013 1546   PLT 166 07/13/2022 0504   PLT 203 01/22/2013 1546    BMET    Component Value Date/Time   NA 132 (L) 07/13/2022 0504   NA 137 01/22/2013 1546   K 5.1 07/13/2022 0504   K 3.9 01/22/2013 1546   CL 100 07/13/2022 0504   CL 104 01/22/2013 1546   CO2 25 07/13/2022 0504   CO2 28 01/22/2013 1546   GLUCOSE 139 (H) 07/13/2022 0504   GLUCOSE 211 (H) 01/22/2013 1546   BUN 40 (H) 07/13/2022 0504   BUN 18 01/22/2013 1546   CREATININE 0.97 07/13/2022 0504   CREATININE 1.35 (H) 01/22/2013 1546   CALCIUM 8.2 (L) 07/13/2022 0504   CALCIUM 8.9 01/22/2013 1546   GFRNONAA >60 07/13/2022 0504   GFRNONAA 48 (L) 01/22/2013 1546   GFRAA >60 01/28/2016 1259   GFRAA 56 (L) 01/22/2013 1546    Assessment/Planning: Gangrene left second toe with cellulitis and necrotic tissue left third toe amputation site Severe peripheral vascular disease  Patient scheduled for angio today.  Will plan for surgical intervention tomorrow with amputation of his left second toe  gangrenous area as well as I&D of the third toe amputation site and debridement if needed. I discussed with the patient the risks associated with surgery.  I discussed this also with his daughter via telephone today. All questions answered today.  Patient was in agreement to proceed with surgery tomorrow. Orders will be placed.  Samara Deist A  07/13/2022, 1:33 PM

## 2022-07-13 NOTE — Progress Notes (Signed)
[facility] Progress Note  Patient: Charles Jennings    HUD:[id number]  DOA: 07/12/2022     Date of Service: the patient was seen and examined on 07/13/2022  Chief Complaint  Patient presents with   Foot Problem   Brief hospital course: 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   Initial vitals in the ED showed temperature of 97.6, respiration rate of 18, heart rate 76, blood pressure 165/67, SpO2 of 96% on room air.   Serum sodium is 131, potassium 4.5, chloride 90, bicarb 22, BUN of 53, serum creatinine 1.36, nonfasting blood glucose 242, EGFR 49, WBC 16.8, hemoglobin 17.1, platelets of 211.   Anion gap was elevated at 19.  Alk phos elevated at 212.   ED treatment: Cefepime, vancomycin, metronidazole.  He reports he saw Dr. Cleda Mccreedy, and was advised to come to the ED. He reports the left foot has been bothering him for over one year. He states this was a regular scheduled visit.    Assessment and Plan: Principal Problem:   Osteomyelitis ([place]) Active Problems:   Limb ischemia   Insulin dependent type 2 diabetes mellitus (HCC)   Mixed hyperlipidemia   Benign essential HTN   Stage 3a chronic kidney disease (CKD) (HCC)   Weakness  # Osteomyelitis (HCC) Left third toe osteomyelitis With possible cellulitis at the site of left foot amputation - Continue with vancomycin and cefepime - Blood cultures x 2 NGTD - Sodium chloride 125 mL/h, 1 day ordered - Podiatry has been consulted, Dr. Cleda Mccreedy - Vascular consulted, n.p.o. plan for lower extremity angiogram today, 07/13/2022    Insulin dependent type 2 diabetes mellitus ([place]) - Home NovoLog 70/30, 10 units daily before breakfast not resumed on admission - Jardiance 10 mg daily, metformin 500  mg p.o. twice daily were not resumed on admission - Insulin SSI with at bedtime coverage ordered - Goal inpatient blood glucose levels 140-180   Mixed hyperlipidemia - Atorvastatin 40 mg daily   Benign essential HTN - Diltiazem 240 mg daily, lisinopril 40 mg daily resumed - Hydralazine 5 mg IV every 8 hours as needed for SBP greater than 175, 4 days ordered   Weakness Patient need on the floor for 1 day due to extreme weakness, per daughter - CK 66 wnl, B12 level 563 wnl, TSH 3.17 wnl - PT, OT ordered   Stage 3a chronic kidney disease (CKD) (HCC) - Serum creatinine elevation, does not meet criteria for acute kidney injury, presumed secondary to poor p.o. intake - Serum creatinine ranges 1.03-1.29 - Sodium chloride IVF at 100 mL/h, 1 day ordered - Check BMP in a.m.  Isotonic hyponatremia most likely due to nutritional deficiency Serum osmolality 289, Sodium 132   Hypophosphatemia, monitor and replete as needed.    Body mass index is 21.58 kg/m.  Interventions:       Diet: NPO for now, resume carb modified diet after the procedure DVT Prophylaxis: Subcutaneous Heparin    Advance goals of care discussion: Full code  Family Communication: family was not present at bedside, at the time of interview.  The pt provided permission to discuss medical plan with the family. Opportunity was given to ask question and all questions were answered satisfactorily.   Disposition:  Pt is from Home, admitted with left toe osteomyelitis, vascular surgery and podiatry consulted, still  on IV antibiotics, which precludes a safe discharge. Discharge to SNF TBD after PT/OT eval, when medically stable and cleared by podiatry and vascular surgery..  Subjective: No significant events overnight, patient pain in the left toes 5/10, it is off and on, no constant pain.  Patient was resting comfortably, denied any chest pain or palpitation, no worsening of shortness of breath.  Physical  Exam: General: NAD, lying comfortably Appear in no distress, affect appropriate Eyes: PERRLA ENT: Oral Mucosa Clear, moist  Neck: no JVD,  Cardiovascular: S1 and S2 Present, no Murmur,  Respiratory: good respiratory effort, Bilateral Air entry equal and Decreased, no Crackles, no wheezes Abdomen: Bowel Sound present, Soft and no tenderness,  Skin: no rashes Extremities: no Pedal edema, no calf tenderness.  Left second toe blackish discolored, dressing CDI, did not happen to see the third and forth toes Neurologic: without any new focal findings Gait not checked due to patient safety concerns  Vitals:   07/12/22 1917 07/12/22 1917 07/13/22 0433 07/13/22 0740  BP: (!) 108/57 (!) 108/57 134/70 (!) 137/56  Pulse: 74 74 79 65  Resp: '18 18 17 16  '$ Temp: 98 F (36.7 C) 98 F (36.7 C) (!) 97.5 F (36.4 C)   TempSrc:  Oral Oral   SpO2:  95% 98% 94%  Weight:      Height:        Intake/Output Summary (Last 24 hours) at 07/13/2022 1227 Last data filed at 07/13/2022 0600 Gross per 24 hour  Intake 1672.12 ml  Output 775 ml  Net 897.12 ml   Filed Weights   07/12/22 1104  Weight: 62.5 kg    Data Reviewed: I have personally reviewed and interpreted daily labs, tele strips, imagings as discussed above. I reviewed all nursing notes, pharmacy notes, vitals, pertinent old records I have discussed plan of care as described above with RN and patient/family.  CBC: Recent Labs  Lab 07/12/22 1130 07/13/22 0504  WBC 16.8* 11.1*  NEUTROABS 13.8*  --   HGB 17.1* 14.4  HCT 51.7 42.8  MCV 87.9 87.2  PLT 211 672   Basic Metabolic Panel: Recent Labs  Lab 07/12/22 1130 07/13/22 0504 07/13/22 1010  NA 131* 132*  --   K 4.5 5.1  --   CL 90* 100  --   CO2 22 25  --   GLUCOSE 242* 139*  --   BUN 58* 40*  --   CREATININE 1.36* 0.97  --   CALCIUM 9.3 8.2*  --   MG  --   --  1.7  PHOS  --   --  2.3*    Studies: No results found.  Scheduled Meds:  atorvastatin  40 mg Oral QPM    diltiazem  240 mg Oral Daily   gabapentin  300 mg Oral Daily   insulin aspart  0-5 Units Subcutaneous QHS   insulin aspart  0-9 Units Subcutaneous TID WC   lisinopril  40 mg Oral Daily   Continuous Infusions:  sodium chloride 100 mL/hr at 07/13/22 1146   ceFEPime (MAXIPIME) IV 2 g (07/13/22 0917)   metronidazole 500 mg (07/13/22 1147)   vancomycin     PRN Meds: acetaminophen **OR** acetaminophen, hydrALAZINE, morphine injection, ondansetron **OR** ondansetron (ZOFRAN) IV, oxyCODONE-acetaminophen, senna-docusate  Time spent: 35 minutes  Author: Val Riles. MD [facility] 07/13/2022 12:27 PM  To reach On-call, see care teams to locate the attending and reach out to them via [email or web address]. If 7PM-7AM, please contact night-coverage If you still  have difficulty reaching the attending provider, please page the [facility] (Director on Call) for [facility] on amion for assistance.

## 2022-07-13 NOTE — Interval H&P Note (Signed)
History and Physical Interval Note:  07/13/2022 4:43 PM  Charles Jennings  has presented today for surgery, with [place] [address].  The various methods of treatment have been discussed with the patient and family. After consideration of risks, benefits and other options for treatment, the patient has consented to  Procedure(s): Lower Extremity Angiography (Left) as a surgical intervention.  [name] history has been reviewed, patient examined, no change in status, stable for surgery.  I have reviewed the patient's chart and labs.  Questions were answered to the patient's satisfaction.     [name]

## 2022-07-13 NOTE — Progress Notes (Signed)
OT Cancellation Note  Patient Details Name: DARYON REMMERT MRN: [id number] DOB: 1927/07/31   Cancelled Treatment:    Reason Eval/Treat Not Completed: Pain limiting ability to participate. OT attempted to see pt for evaluation this morning. Pt states he has too much pain with weightbearing and refuses to get out of bed until after surgery. Grandson and RN in room; both attempting to motivate pt; pt continues to refuse even EOB mobility. Does discussion prior level and states that since surgery on 06/30/22 he has been having a lot of pain with activity/mobility; has been using RW; has had approx 3 falls where he is walking with RW and will fall due to pain. Pt states he has good family support from grandson, son-in-law, aide, and [facility].   Vania Rea 07/13/2022, 9:25 AM

## 2022-07-13 NOTE — Progress Notes (Signed)
PT Cancellation Note  Patient Details Name: Charles Jennings MRN: [id number] DOB: Apr 08, 1928   Cancelled Treatment:    Reason Eval/Treat Not Completed: Other (comment).  Pt firmly declined PT evaluation d/t concerns of pain with LE WB'ing (pt declined pain medication) and also refused to even sit on edge of bed d/t concerns of falling.  Will re-attempt PT evaluation at a later date/time.  Leitha Bleak, PT 07/13/22, 9:44 AM

## 2022-07-14 ENCOUNTER — Encounter
Admission: EM | Disposition: A | Discharge status: Skilled Nursing Facility | Payer: Self-pay | Source: Ambulatory Visit | Attending: Student

## 2022-07-14 ENCOUNTER — Encounter: Payer: Self-pay | Admitting: Vascular Surgery

## 2022-07-14 DIAGNOSIS — I743 Embolism and thrombosis of arteries of the lower extremities: Secondary | ICD-10-CM | POA: Diagnosis not present

## 2022-07-14 DIAGNOSIS — T82868A Thrombosis of vascular prosthetic devices, implants and grafts, initial encounter: Secondary | ICD-10-CM | POA: Diagnosis not present

## 2022-07-14 DIAGNOSIS — I70262 Atherosclerosis of native arteries of extremities with gangrene, left leg: Secondary | ICD-10-CM | POA: Diagnosis not present

## 2022-07-14 DIAGNOSIS — M869 Osteomyelitis, unspecified: Secondary | ICD-10-CM | POA: Diagnosis not present

## 2022-07-14 HISTORY — PX: LOWER EXTREMITY ANGIOGRAPHY: CATH118251

## 2022-07-14 LAB — CBC
HCT: 40.9 % (ref 39.0–52.0)
Hemoglobin: 13.4 g/dL (ref 13.0–17.0)
MCH: 29.2 pg (ref 26.0–34.0)
MCHC: 32.8 g/dL (ref 30.0–36.0)
MCV: 89.1 fL (ref 80.0–100.0)
Platelets: 152 10*3/uL (ref 150–400)
RBC: 4.59 MIL/uL (ref 4.22–5.81)
RDW: 13.6 % (ref 11.5–15.5)
WBC: 10.5 10*3/uL (ref 4.0–10.5)
nRBC: 0 % (ref 0.0–0.2)

## 2022-07-14 LAB — BASIC METABOLIC PANEL
Anion gap: 9 (ref 5–15)
BUN: 30 mg/dL — ABNORMAL HIGH (ref 8–23)
CO2: 21 mmol/L — ABNORMAL LOW (ref 22–32)
Calcium: 7.7 mg/dL — ABNORMAL LOW (ref 8.9–10.3)
Chloride: 105 mmol/L (ref 98–111)
Creatinine, Ser: 0.98 mg/dL (ref 0.61–1.24)
GFR, Estimated: >60 mL/min (ref >60)
Glucose, Bld: 146 mg/dL — ABNORMAL HIGH (ref 70–99)
Potassium: 3.7 mmol/L (ref 3.5–5.1)
Sodium: 135 mmol/L (ref 135–145)

## 2022-07-14 LAB — GLUCOSE, CAPILLARY
Glucose-Capillary: 144 mg/dL — ABNORMAL HIGH (ref 70–99)
Glucose-Capillary: 145 mg/dL — ABNORMAL HIGH (ref 70–99)
Glucose-Capillary: 103 mg/dL — ABNORMAL HIGH (ref 70–99)
Glucose-Capillary: 116 mg/dL — ABNORMAL HIGH (ref 70–99)
Glucose-Capillary: 115 mg/dL — ABNORMAL HIGH (ref 70–99)

## 2022-07-14 LAB — MAGNESIUM: Magnesium: 1.6 mg/dL — ABNORMAL LOW (ref 1.7–2.4)

## 2022-07-14 LAB — PHOSPHORUS: Phosphorus: 3 mg/dL (ref 2.5–4.6)

## 2022-07-14 SURGERY — AMPUTATION, TOE
Anesthesia: Choice | Site: Toe | Laterality: Left

## 2022-07-14 SURGERY — LOWER EXTREMITY ANGIOGRAPHY
Anesthesia: Moderate Sedation | Laterality: Left

## 2022-07-14 MED ORDER — TIROFIBAN HCL IN NACL 5-0.9 MG/100ML-% IV SOLN
0.0750 ug/kg/min | INTRAVENOUS | Status: AC
  Filled 2022-07-14: qty 100

## 2022-07-14 MED ORDER — TIROFIBAN (AGGRASTAT) BOLUS VIA INFUSION
25.0000 ug/kg | Freq: Once | INTRAVENOUS | Status: DC
  Filled 2022-07-14: qty 28

## 2022-07-14 MED ORDER — TIROFIBAN HCL IV 12.5 MG/250 ML
INTRAVENOUS | Status: AC
  Administered 2022-07-14: 0.075 ug/kg/min via INTRAVENOUS
  Filled 2022-07-14: qty 250

## 2022-07-14 MED ORDER — MIDAZOLAM HCL 2 MG/2ML IJ SOLN
INTRAMUSCULAR | Status: DC | PRN
  Administered 2022-07-14: .5 mg via INTRAVENOUS

## 2022-07-14 MED ORDER — MIDAZOLAM HCL 2 MG/2ML IJ SOLN
INTRAMUSCULAR | Status: AC
  Filled 2022-07-14: qty 2

## 2022-07-14 MED ORDER — TIROFIBAN HCL IV 12.5 MG/250 ML
0.0750 ug/kg/min | INTRAVENOUS | Status: DC
  Administered 2022-07-14: 0.075 ug/kg/min via INTRAVENOUS

## 2022-07-14 MED ORDER — FENTANYL CITRATE PF 50 MCG/ML IJ SOSY
PREFILLED_SYRINGE | INTRAMUSCULAR | Status: AC
  Filled 2022-07-14: qty 1

## 2022-07-14 MED ORDER — FENTANYL CITRATE (PF) 100 MCG/2ML IJ SOLN
INTRAMUSCULAR | Status: DC | PRN
  Administered 2022-07-14: 12.5 ug via INTRAVENOUS

## 2022-07-14 MED ORDER — HEPARIN SODIUM (PORCINE) 1000 UNIT/ML IJ SOLN
INTRAMUSCULAR | Status: AC
  Filled 2022-07-14: qty 10

## 2022-07-14 MED ORDER — MAGNESIUM SULFATE 2 GM/50ML IV SOLN
2.0000 g | Freq: Once | INTRAVENOUS | Status: AC
  Administered 2022-07-14: 2 g via INTRAVENOUS
  Filled 2022-07-14: qty 50

## 2022-07-14 MED ORDER — IODIXANOL 320 MG/ML IV SOLN
INTRAVENOUS | Status: DC | PRN
  Administered 2022-07-14: 50 mL via INTRA_ARTERIAL

## 2022-07-14 SURGICAL SUPPLY — 13 items
BALLN LUTONIX 018 6X150X130 (BALLOONS) ×1
BALLOON LUTONIX 018 6X150X130 (BALLOONS) IMPLANT
BIOPATCH WHT 1IN DISK W/4.0 H (GAUZE/BANDAGES/DRESSINGS) IMPLANT
CANISTER PENUMBRA ENGINE (MISCELLANEOUS) IMPLANT
CATH INDIGO CAT6 KIT (CATHETERS) IMPLANT
DEVICE SAFEGUARD 24CM (GAUZE/BANDAGES/DRESSINGS) IMPLANT
DEVICE STARCLOSE SE CLOSURE (Vascular Products) IMPLANT
KIT ENCORE 26 ADVANTAGE (KITS) IMPLANT
PACK ANGIOGRAPHY (CUSTOM PROCEDURE TRAY) ×1 IMPLANT
STENT VIABAHN 6X150X120 (Permanent Stent) IMPLANT
SUT MNCRL AB 4-0 PS2 18 (SUTURE) IMPLANT
WIRE G V18X300 ST (WIRE) IMPLANT
WIRE GUIDERIGHT .035X150 (WIRE) IMPLANT

## 2022-07-14 NOTE — H&P (View-Only) (Signed)
Progress Note    07/14/2022 11:14 AM 1 Day Post-Op  Subjective: Charles Jennings is a Charles Jennings who is now postop day 1 from an aortogram with selective left lower extremity angiogram, mechanical thrombectomy of the left SFA, pop with popliteal artery, and tibial peroneal trunk.  He received directed thrombolytic therapy tPA instilled at the time of the procedure and a continuous tPA infusion overnight.  He also had placed a right femoral venous triple-lumen catheter placement.  Upon exam this morning I found the patient resting supine comfortably in bed in ICU.  Right femoral venous triple-lumen catheter in place with no hematoma seroma or infection noted.  He also had right vascular access to the right femoral artery in which heparin and tPA were continuously running.  No hematoma seroma or complications noted in his right groin at this time.  Patient had no other complaints overnight.  Patient's vitals all remained stable.   Vitals:   07/14/22 0800 07/14/22 0900  BP: (!) 118/55 123/61  Pulse: 74 76  Resp: (!) 22 19  Temp: 98.1 F (36.7 C)   SpO2: 100% 98%   Physical Exam: Cardiac:  regular, with permanent Milarose Savich maker, without  Murmurs, rubs or gallops; without carotid bruits  Lungs:  normal non-labored breathing, without Rales, rhonchi, wheezing  Incisions:  Right groin dressing for both arterial sheath and triple lumen catheter are C/D/I without hematoma or seroma.  Extremities:  Left lower extremity with gangrene of the second toe and an odorous infection of prior 3rd toe amputation site.  Positive strong doppler DP and PT pulses.   Abdomen:  Positive Bowel sounds, soft, flat, NT/ND, no masses  Neurologic: A&O X 3; No focal weakness or paresthesias are detected; speech is fluent/normal   CBC    Component Value Date/Time   WBC 10.5 07/14/2022 0510   RBC 4.59 07/14/2022 0510   HGB 13.4 07/14/2022 0510   HGB 15.5 01/22/2013 1546   HCT 40.9 07/14/2022 0510   HCT 45.4  01/22/2013 1546   PLT 152 07/14/2022 0510   PLT 203 01/22/2013 1546   MCV 89.1 07/14/2022 0510   MCV 88 01/22/2013 1546   MCH 29.2 07/14/2022 0510   MCHC 32.8 07/14/2022 0510   RDW 13.6 07/14/2022 0510   RDW 13.9 01/22/2013 1546   LYMPHSABS 1.5 07/12/2022 1130   MONOABS 1.2 (H) 07/12/2022 1130   EOSABS 0.0 07/12/2022 1130   BASOSABS 0.1 07/12/2022 1130    BMET    Component Value Date/Time   NA 135 07/14/2022 0510   NA 137 01/22/2013 1546   K 3.7 07/14/2022 0510   K 3.9 01/22/2013 1546   CL 105 07/14/2022 0510   CL 104 01/22/2013 1546   CO2 21 (L) 07/14/2022 0510   CO2 28 01/22/2013 1546   GLUCOSE 146 (H) 07/14/2022 0510   GLUCOSE 211 (H) 01/22/2013 1546   BUN 30 (H) 07/14/2022 0510   BUN 18 01/22/2013 1546   CREATININE 0.98 07/14/2022 0510   CREATININE 1.35 (H) 01/22/2013 1546   CALCIUM 7.7 (L) 07/14/2022 0510   CALCIUM 8.9 01/22/2013 1546   GFRNONAA >60 07/14/2022 0510   GFRNONAA 48 (L) 01/22/2013 1546   GFRAA >60 01/28/2016 1259   GFRAA 56 (L) 01/22/2013 1546    INR    Component Value Date/Time   INR 1.1 07/12/2022 1201     Intake/Output Summary (Last 24 hours) at 07/14/2022 1114 Last data filed at 07/14/2022 1000 Gross per 24 hour  Intake 1718.62 ml  Output 1150 ml  Net 568.62 ml     Assessment/Plan:  Charles y.o. Jennings is s/p aortogram with selective left lower extremity angiogram, mechanical thrombectomy of the left SFA, pop with popliteal artery, and tibial peroneal trunk. He was placed on a heparin infusion and TPA infusion overnight through right groin arterial sheath and right venous triple lumen catheter. 1 Day Post-Op    PLAN:   Return to the Vascular Lab this afternoon for completion of the procedure. Patient is aware.   DVT prophylaxis:  Heparin Infusion and TPA infusion    Drema Pry [facility] 07/14/2022 11:14 AM

## 2022-07-14 NOTE — Progress Notes (Signed)
PT Cancellation Note  Patient Details Name: RAYMIR BENDIK MRN: [id number] DOB: [birth year]   Cancelled Treatment:    Reason Eval/Treat Not Completed: Patient not medically ready PT orders received, chart reviewed. Pt anticipating sx again today. Will hold PT evaluation at this time & f/u after procedure.  Lavone Nian, PT, DPT 07/14/22, 8:37 AM   Waunita Schooner 07/14/2022, 8:37 AM

## 2022-07-14 NOTE — Progress Notes (Signed)
Progress Note    07/14/2022 11:14 AM 1 Day Post-Op  Subjective: [name] is a 89 year old male who is now postop day 1 from an aortogram with selective left lower extremity angiogram, mechanical thrombectomy of the left SFA, pop with popliteal artery, and tibial peroneal trunk.  He received directed thrombolytic therapy tPA instilled at the time of the procedure and a continuous tPA infusion overnight.  He also had placed a right femoral venous triple-lumen catheter placement.  Upon exam this morning I found the patient resting supine comfortably in bed in ICU.  Right femoral venous triple-lumen catheter in place with no hematoma seroma or infection noted.  He also had right vascular access to the right femoral artery in which heparin and tPA were continuously running.  No hematoma seroma or complications noted in his right groin at this time.  Patient had no other complaints overnight.  Patient's vitals all remained stable.   Vitals:   07/14/22 0800 07/14/22 0900  BP: (!) 118/55 123/61  Pulse: 74 76  Resp: (!) 22 19  Temp: 98.1 F (36.7 C)   SpO2: 100% 98%   Physical Exam: Cardiac:  regular, with permanent Charles Jennings, without  Murmurs, rubs or gallops; without carotid bruits  Lungs:  normal non-labored breathing, without Rales, rhonchi, wheezing  Incisions:  Right groin dressing for both arterial sheath and triple lumen catheter are C/D/I without hematoma or seroma.  Extremities:  Left lower extremity with gangrene of the second toe and an odorous infection of prior 3rd toe amputation site.  Positive strong doppler DP and PT pulses.   Abdomen:  Positive Bowel sounds, soft, flat, NT/ND, no masses  Neurologic: A&O X 3; No focal weakness or paresthesias are detected; speech is fluent/normal   CBC    Component Value Date/Time   WBC 10.5 07/14/2022 0510   RBC 4.59 07/14/2022 0510   HGB 13.4 07/14/2022 0510   HGB 15.5 01/22/2013 1546   HCT 40.9 07/14/2022 0510   HCT 45.4  01/22/2013 1546   PLT 152 07/14/2022 0510   PLT 203 01/22/2013 1546   MCV 89.1 07/14/2022 0510   MCV 88 01/22/2013 1546   MCH 29.2 07/14/2022 0510   MCHC 32.8 07/14/2022 0510   RDW 13.6 07/14/2022 0510   RDW 13.9 01/22/2013 1546   LYMPHSABS 1.5 07/12/2022 1130   MONOABS 1.2 (H) 07/12/2022 1130   EOSABS 0.0 07/12/2022 1130   BASOSABS 0.1 07/12/2022 1130    BMET    Component Value Date/Time   NA 135 07/14/2022 0510   NA 137 01/22/2013 1546   K 3.7 07/14/2022 0510   K 3.9 01/22/2013 1546   CL 105 07/14/2022 0510   CL 104 01/22/2013 1546   CO2 21 (L) 07/14/2022 0510   CO2 28 01/22/2013 1546   GLUCOSE 146 (H) 07/14/2022 0510   GLUCOSE 211 (H) 01/22/2013 1546   BUN 30 (H) 07/14/2022 0510   BUN 18 01/22/2013 1546   CREATININE 0.98 07/14/2022 0510   CREATININE 1.35 (H) 01/22/2013 1546   CALCIUM 7.7 (L) 07/14/2022 0510   CALCIUM 8.9 01/22/2013 1546   GFRNONAA >60 07/14/2022 0510   GFRNONAA 48 (L) 01/22/2013 1546   GFRAA >60 01/28/2016 1259   GFRAA 56 (L) 01/22/2013 1546    INR    Component Value Date/Time   INR 1.1 07/12/2022 1201     Intake/Output Summary (Last 24 hours) at 07/14/2022 1114 Last data filed at 07/14/2022 1000 Gross per 24 hour  Intake 1718.62 ml  Output 1150 ml  Net 568.62 ml     Assessment/Plan:  89 y.o. male is s/p aortogram with selective left lower extremity angiogram, mechanical thrombectomy of the left SFA, pop with popliteal artery, and tibial peroneal trunk. He was placed on a heparin infusion and TPA infusion overnight through right groin arterial sheath and right venous triple lumen catheter. 1 Day Post-Op    PLAN:   Return to the Vascular Lab this afternoon for completion of the procedure. Patient is aware.   DVT prophylaxis:  Heparin Infusion and TPA infusion    Drema [facility] 07/14/2022 11:14 AM

## 2022-07-14 NOTE — Progress Notes (Signed)
[facility] Progress Note  Patient: Charles Jennings    [id number]  DOA: 07/12/2022     Date of Service: the patient was seen and examined on 07/14/2022  Chief Complaint  Patient presents with   Foot Problem   Brief hospital course: 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   Initial vitals in the ED showed temperature of 97.6, respiration rate of 18, heart rate 76, blood pressure 165/67, SpO2 of 96% on room air.   Serum sodium is 131, potassium 4.5, chloride 90, bicarb 22, BUN of 53, serum creatinine 1.36, nonfasting blood glucose 242, EGFR 49, WBC 16.8, hemoglobin 17.1, platelets of 211.   Anion gap was elevated at 19.  Alk phos elevated at 212.   ED treatment: Cefepime, vancomycin, metronidazole.  He reports he saw Dr. Cleda Jennings, and was advised to come to the ED. He reports the left foot has been bothering him for over one year. He states this was a regular scheduled visit.    Assessment and Plan: Principal Problem:   Osteomyelitis ([place]) Active Problems:   Limb ischemia   Insulin dependent type 2 diabetes mellitus (HCC)   Mixed hyperlipidemia   Benign essential HTN   Stage 3a chronic kidney disease (CKD) (HCC)   Weakness   LLE peripheral artery disease Vascular surgery consulted, On 2/8 s/p angiogram, mechanical thrombectomy of left SFA pop with popliteal artery and tibial peroneal trunk. S/p tPA at the time of procedure, patient was started on continuous tPA infusion overnight, right femoral triple-lumen catheter was placed. Patient was on heparin IV infusion as well Patient needs repeat vascular surgery intervention today before any surgical procedures by podiatry Follow vascular surgery for further recommendation  #  Osteomyelitis of Left third toe With possible cellulitis at the site of left foot amputation Continue with vancomycin and cefepime Blood cultures x 2 NGTD Sodium chloride 125 mL/h, 1 day ordered Podiatry has been consulted, Dr. Cleda Jennings   Insulin dependent type 2 diabetes mellitus ([place]) - Home NovoLog 70/30, 10 units daily before breakfast not resumed on admission - Jardiance 10 mg daily, metformin 500 mg p.o. twice daily were not resumed on admission - Insulin SSI with at bedtime coverage ordered - Goal inpatient blood glucose levels 140-180   Mixed hyperlipidemia - Atorvastatin 40 mg daily   Benign essential HTN - Diltiazem 240 mg daily, lisinopril 40 mg daily resumed - Hydralazine 5 mg IV every 8 hours as needed for SBP greater than 175, 4 days ordered   Weakness Patient need on the floor for 1 day due to extreme weakness, per daughter - CK 66 wnl, B12 level 563 wnl, TSH 3.17 wnl - PT, OT ordered   Stage 3a chronic kidney disease (CKD) (HCC) - Serum creatinine elevation, does not meet criteria for acute kidney injury, presumed secondary to poor p.o. intake - Serum creatinine ranges 1.03-1.29 - Sodium chloride IVF at 100 mL/h, 1 day ordered - Check BMP in a.m.  Isotonic hyponatremia most likely due to nutritional deficiency Serum osmolality 289, Sodium 132   Hypomagnesemia, mag repleted. Monitor and replete as needed.  Hypophosphatemia, Phos repleted.  Resolved monitor and replete as needed.    Body mass index is 21.58 kg/m.  Interventions:       Diet: NPO for now, resume carb modified diet after the procedure DVT  Prophylaxis: Subcutaneous Heparin    Advance goals of care discussion: Full code  Family Communication: family was not present at bedside, at the time of interview.  The pt provided permission to discuss medical plan with the family. Opportunity was given to ask question and all questions were answered satisfactorily.   Disposition:  Pt is from  Home, admitted with left toe osteomyelitis, vascular surgery and podiatry consulted, s/p LLE angiogram, thrombectomy, still on IV antibiotics, which precludes a safe discharge. Discharge to SNF TBD after PT/OT eval, when medically stable and cleared by podiatry and vascular surgery..  Subjective: No significant events overnight, patient is complaining of pain 2/10 after pain medication, advised 5-6/10 before medications.  Patient denies any chest pain or palpitation, no shortness of breath, no any other active issues. Patient seems to be resting comfortably without any acute distress.  Physical Exam: General: NAD, lying comfortably Appear in no distress, affect appropriate Eyes: PERRLA ENT: Oral Mucosa Clear, moist  Neck: no JVD,  Cardiovascular: S1 and S2 Present, no Murmur,  Respiratory: good respiratory effort, Bilateral Air entry equal and Decreased, no Crackles, no wheezes Abdomen: Bowel Sound present, Soft and no tenderness,  Skin: no rashes Extremities: no Pedal edema, no calf tenderness.  Left second toe blackish discolored, dressing CDI.  Neurologic: without any new focal findings Gait not checked due to patient safety concerns  Vitals:   07/14/22 0800 07/14/22 0900 07/14/22 1200 07/14/22 1345  BP: (!) 118/55 123/61 (!) 101/50 95/71  Pulse: 74 76 71 75  Resp: (!) 22 19 14 19  $ Temp: 98.1 F (36.7 C)  97.7 F (36.5 C)   TempSrc: Oral  Oral   SpO2: 100% 98% 96% 98%  Weight:      Height:        Intake/Output Summary (Last 24 hours) at 07/14/2022 1504 Last data filed at 07/14/2022 1312 Gross per 24 hour  Intake 2022.06 ml  Output 1400 ml  Net 622.06 ml   Filed Weights   07/12/22 1104 07/13/22 1930  Weight: 62.5 kg 55 kg    Data Reviewed: I have personally reviewed and interpreted daily labs, tele strips, imagings as discussed above. I reviewed all nursing notes, pharmacy notes, vitals, pertinent old records I have discussed plan of care as described above with RN and  patient/family.  CBC: Recent Labs  Lab 07/12/22 1130 07/13/22 0504 07/13/22 1822 07/14/22 0510  WBC 16.8* 11.1* 12.7* 10.5  NEUTROABS 13.8*  --   --   --   HGB 17.1* 14.4 13.9 13.4  HCT 51.7 42.8 42.1 40.9  MCV 87.9 87.2 87.9 89.1  PLT 211 166 188 0000000   Basic Metabolic Panel: Recent Labs  Lab 07/12/22 1130 07/13/22 0504 07/13/22 1010 07/14/22 0510  NA 131* 132*  --  135  K 4.5 5.1  --  3.7  CL 90* 100  --  105  CO2 22 25  --  21*  GLUCOSE 242* 139*  --  146*  BUN 58* 40*  --  30*  CREATININE 1.36* 0.97  --  0.98  CALCIUM 9.3 8.2*  --  7.7*  MG  --   --  1.7 1.6*  PHOS  --   --  2.3* 3.0    Studies: PERIPHERAL VASCULAR CATHETERIZATION  Result Date: 07/13/2022 See surgical note for result.   Scheduled Meds:  [MAR Hold] atorvastatin  40 mg Oral QPM   [MAR Hold] Chlorhexidine Gluconate Cloth  6 each Topical Daily   [MAR Hold] diltiazem  240 mg Oral Daily   [MAR Hold] gabapentin  300 mg Oral Daily   [MAR Hold] insulin aspart  0-5 Units Subcutaneous QHS   [MAR Hold] insulin aspart  0-9 Units Subcutaneous TID WC   [MAR Hold] lisinopril  40 mg Oral Daily   Continuous Infusions:  alteplase (LIMB ISCHEMIA) 10 mg in normal saline (0.02 mg/mL) infusion 0.5 mg/hr (07/14/22 1300)   [MAR Hold] ceFEPime (MAXIPIME) IV Stopped (07/14/22 1044)   heparin 600 Units/hr (07/14/22 1300)   [MAR Hold] metronidazole 500 mg (07/14/22 1312)   [MAR Hold] vancomycin 750 mg (07/14/22 1425)   PRN Meds: [MAR Hold] acetaminophen **OR** [MAR Hold] acetaminophen, [MAR Hold] fentaNYL (SUBLIMAZE) injection, [MAR Hold] hydrALAZINE, [MAR Hold]  HYDROmorphone (DILAUDID) injection, [MAR Hold]  morphine injection, [MAR Hold] ondansetron **OR** [MAR Hold] ondansetron (ZOFRAN) IV, [MAR Hold] ondansetron (ZOFRAN) IV, [MAR Hold] senna-docusate  Time spent: 35 minutes  Author: Val Riles. MD [facility] 07/14/2022 3:04 PM  To reach On-call, see care teams to locate the attending and reach out to  them via [email or web address]. If 7PM-7AM, please contact night-coverage If you still have difficulty reaching the attending provider, please page the [facility] (Director on Call) for [facility] on amion for assistance.

## 2022-07-14 NOTE — Consult Note (Signed)
Pharmacy Antibiotic Note  Charles Jennings is a 89 y.o. male with PMH including diabetes, lower limb ischemia, left third toe amputation 06/30/22 admitted on 07/12/2022 with  gangrenous changes to second toe on left foot with cellulitis .  Pharmacy has been consulted for vancomycin and cefepime dosing. Patient is also ordered metronidazole. Since admission renal function has improved  Plan:  1) continue cefepime 2 g IV q12h  2) continue vancomycin 750 mg IV q24h Goal AUC 400-550 Expected AUC: 534.2 SCr used: 0.98 mg/dL Ke: 0.035h-1, T1/2 19.5h  --Levels at steady state or as clinically indicated  Height: 5' 7.5" (171.5 cm) Weight: 55 kg (121 lb 4.1 oz) IBW/kg (Calculated) : 67.25  Temp (24hrs), Avg:97 F (36.1 C), Min:94.1 F (34.5 C), Max:97.6 F (36.4 C)  Recent Labs  Lab 07/12/22 1130 07/12/22 1200 07/12/22 1442 07/13/22 0504 07/13/22 1822 07/14/22 0510  WBC 16.8*  --   --  11.1* 12.7* 10.5  CREATININE 1.36*  --   --  0.97  --  0.98  LATICACIDVEN  --  1.8 1.7  --   --   --      Estimated Creatinine Clearance: 37.4 mL/min (by C-G formula based on SCr of 0.98 mg/dL).    No Known Allergies  Antimicrobials this admission: cefepime 2/7 >>  vancomycin 2/7 >>  metronidazole 2/7 >>   Microbiology results: 2/7 BCx: NGTD 2/8 MRSA PCR: negative  Thank you for allowing pharmacy to be a part of this patient's care.  Dallie Piles 07/14/2022 8:56 AM

## 2022-07-14 NOTE — Progress Notes (Signed)
Daily Progress Note   Subjective  - Day of Surgery  Follow-up left foot gangrenous changes.  Patient underwent angio yesterday.  Found to have residual cyst.  Plan for reevaluation tomorrow via vascular surgery.  On tPA infusion overnight.  Objective Vitals:   07/14/22 0800 07/14/22 0900 07/14/22 1200 07/14/22 1345  BP: (!) 118/55 123/61 (!) 101/50 95/71  Pulse: 74 76 71 75  Resp: (!) 22 19 14 19  $ Temp: 98.1 F (36.7 C)  97.7 F (36.5 C)   TempSrc: Oral  Oral   SpO2: 100% 98% 96% 98%  Weight:      Height:        Physical Exam: Left foot bandages intact.  Gangrenous changes to the distal second toe are stable.  Laboratory CBC    Component Value Date/Time   WBC 10.5 07/14/2022 0510   HGB 13.4 07/14/2022 0510   HGB 15.5 01/22/2013 1546   HCT 40.9 07/14/2022 0510   HCT 45.4 01/22/2013 1546   PLT 152 07/14/2022 0510   PLT 203 01/22/2013 1546    BMET    Component Value Date/Time   NA 135 07/14/2022 0510   NA 137 01/22/2013 1546   K 3.7 07/14/2022 0510   K 3.9 01/22/2013 1546   CL 105 07/14/2022 0510   CL 104 01/22/2013 1546   CO2 21 (L) 07/14/2022 0510   CO2 28 01/22/2013 1546   GLUCOSE 146 (H) 07/14/2022 0510   GLUCOSE 211 (H) 01/22/2013 1546   BUN 30 (H) 07/14/2022 0510   BUN 18 01/22/2013 1546   CREATININE 0.98 07/14/2022 0510   CREATININE 1.35 (H) 01/22/2013 1546   CALCIUM 7.7 (L) 07/14/2022 0510   CALCIUM 8.9 01/22/2013 1546   GFRNONAA >60 07/14/2022 0510   GFRNONAA 48 (L) 01/22/2013 1546   GFRAA >60 01/28/2016 1259   GFRAA 56 (L) 01/22/2013 1546    Assessment/Planning: Gangrene left forefoot with severe peripheral vascular disease  Will defer surgery for now.  Will await revascularization.  Will follow-up tomorrow for reevaluation.  May perform surgery on Sunday if patient is stable and revascularization is successful.  Samara Deist A  07/14/2022, 1:57 PM

## 2022-07-14 NOTE — Progress Notes (Signed)
OT Cancellation Note  Patient Details Name: ROBBIN STYLES MRN: [id number] DOB: 13-Jan-1928   Cancelled Treatment:    Reason Eval/Treat Not Completed: Other (comment) (Patient going for toe amputation today). Will hold OT until after surgery, as pt refusing OT yesterday when attempted prior to procedure yesterday.   Vania Rea 07/14/2022, 8:50 AM

## 2022-07-14 NOTE — Interval H&P Note (Signed)
History and Physical Interval Note:  07/14/2022 2:32 PM  Charles Jennings  has presented today for surgery, with the diagnosis of ulceration.  The various methods of treatment have been discussed with the patient and family. After consideration of risks, benefits and other options for treatment, the patient has consented to  Procedure(s): Lower Extremity Angiography (Left) as a surgical intervention.  [name] history has been reviewed, patient examined, no change in status, stable for surgery.  I have reviewed the patient's chart and labs.  Questions were answered to the patient's satisfaction.     [name]

## 2022-07-14 NOTE — Op Note (Signed)
[facility]  Percutaneous Study/Intervention Procedural Note   Date of Surgery: 07/14/2022  Surgeon(s):Marithza Malachi    Assistants:none  Pre-operative Diagnosis: PAD with green left lower extremity  Post-operative diagnosis:  Same  Procedure(s) Performed:             1.  Left lower extremity angiogram             2.  Mechanical thrombectomy of the left SFA, popliteal artery, tibioperoneal trunk, and proximal posterior tibial artery with the penumbra CAT 6 device             3.  Stent placement to the left distal SFA and above-knee popliteal artery with 6 mm diameter by 15 cm length Viabahn stent for residual thrombus and stenosis after thrombectomy             4.  StarClose closure device right femoral artery  EBL: 50 cc  Contrast: 40 cc  Fluoro Time: 2.9 minutes  Moderate Conscious Sedation Time: approximately 22 minutes using 0.5 mg of Versed and 12.5 mcg of Fentanyl              Indications:  Patient is a 89 y.o.male with gangrene of the left foot and overnight thrombolytic therapy. The patient is brought in for angiography for further evaluation and potential treatment.  Due to the limb threatening nature of the situation, angiogram was performed for attempted limb salvage. The patient is aware that if the procedure fails, amputation would be expected.  The patient also understands that even with successful revascularization, amputation may still be required due to the severity of the situation.  Risks and benefits are discussed and informed consent is obtained.   Procedure:  The patient was identified and appropriate procedural time out was performed.  The patient was then placed supine on the table and prepped and draped in the usual sterile fashion. Moderate conscious sedation was administered during a face to face encounter with the patient throughout the procedure with my supervision of the RN administering medicines and monitoring the patient's vital signs,  pulse oximetry, telemetry and mental status throughout from the start of the procedure until the patient was taken to the recovery room.  A V18 wire was placed and the lysis catheter was removed.  Imaging was done initially through the sheath.  Selective left lower extremity angiogram was then performed. This demonstrated residual thrombus within the left SFA and popliteal stents as well as thrombus in the tibioperoneal trunk.  There was reconstitution of all 3 tibial vessels after the thrombus and occlusion. It was felt that it was in the patient's best interest to proceed with intervention after these images to avoid a second procedure and a larger amount of contrast and fluoroscopy based off of the findings from the initial angiogram.  Over the V18 wire, we perform mechanical thrombectomy in the left SFA, popliteal artery, tibioperoneal trunk, and proximal posterior tibial artery with 2 passes with the penumbra CAT 6 device.  Following this, there was marked improvement with the only area of residual thrombus and stenosis in the distal SFA and above-knee popliteal artery within the previously placed stents.  I elected to cover this with a 6 mm diameter by 15 cm length Viabahn stent postdilated with a 6 mm diameter by 15 cm length Lutonix drug-coated angioplasty balloon.  Completion imaging showed marked improved flow with less than 10% residual stenosis and maintained runoff distally I elected to terminate the procedure. The sheath was removed and  StarClose closure device was deployed in the right femoral artery with excellent hemostatic result. The patient was taken to the recovery room in stable condition having tolerated the procedure well.  Findings:                            Left Lower Extremity:  residual thrombus within the left SFA and popliteal stents as well as thrombus in the tibioperoneal trunk.  There was reconstitution of all 3 tibial vessels after the thrombus and occlusion.   Disposition:  Patient was taken to the recovery room in stable condition having tolerated the procedure well.  Complications: None  Charles Jennings 07/14/2022 3:50 PM   This note was created with Dragon Medical transcription system. Any errors in dictation are purely unintentional.

## 2022-07-14 NOTE — Progress Notes (Signed)
Call returned to pt's daughter, Gabriel Cirri. Daughter updated on pt status and plan of care, all questions answered to the best of RN's ability.

## 2022-07-15 DIAGNOSIS — I96 Gangrene, not elsewhere classified: Secondary | ICD-10-CM | POA: Diagnosis not present

## 2022-07-15 LAB — CBC WITH DIFFERENTIAL/PLATELET
Abs Immature Granulocytes: 0.17 10*3/uL — ABNORMAL HIGH (ref 0.00–0.07)
Basophils Absolute: 0.1 10*3/uL (ref 0.0–0.1)
Basophils Relative: 1 %
Eosinophils Absolute: 0 10*3/uL (ref 0.0–0.5)
Eosinophils Relative: 0 %
HCT: 36.4 % — ABNORMAL LOW (ref 39.0–52.0)
Hemoglobin: 12.2 g/dL — ABNORMAL LOW (ref 13.0–17.0)
Immature Granulocytes: 1 %
Lymphocytes Relative: 12 %
Lymphs Abs: 1.7 10*3/uL (ref 0.7–4.0)
MCH: 29.4 pg (ref 26.0–34.0)
MCHC: 33.5 g/dL (ref 30.0–36.0)
MCV: 87.7 fL (ref 80.0–100.0)
Monocytes Absolute: 1.7 10*3/uL — ABNORMAL HIGH (ref 0.1–1.0)
Monocytes Relative: 12 %
Neutro Abs: 10.5 10*3/uL — ABNORMAL HIGH (ref 1.7–7.7)
Neutrophils Relative %: 74 %
Platelets: 148 10*3/uL — ABNORMAL LOW (ref 150–400)
RBC: 4.15 MIL/uL — ABNORMAL LOW (ref 4.22–5.81)
RDW: 14 % (ref 11.5–15.5)
WBC: 14.1 10*3/uL — ABNORMAL HIGH (ref 4.0–10.5)
nRBC: 0 % (ref 0.0–0.2)

## 2022-07-15 LAB — BASIC METABOLIC PANEL
Anion gap: 13 (ref 5–15)
BUN: 28 mg/dL — ABNORMAL HIGH (ref 8–23)
CO2: 20 mmol/L — ABNORMAL LOW (ref 22–32)
Calcium: 8.1 mg/dL — ABNORMAL LOW (ref 8.9–10.3)
Chloride: 105 mmol/L (ref 98–111)
Creatinine, Ser: 0.99 mg/dL (ref 0.61–1.24)
GFR, Estimated: >60 mL/min (ref >60)
Glucose, Bld: 177 mg/dL — ABNORMAL HIGH (ref 70–99)
Potassium: 4.3 mmol/L (ref 3.5–5.1)
Sodium: 138 mmol/L (ref 135–145)

## 2022-07-15 LAB — COMPREHENSIVE METABOLIC PANEL
ALT: 16 U/L (ref 0–44)
AST: 25 U/L (ref 15–41)
Albumin: 1.7 g/dL — ABNORMAL LOW (ref 3.5–5.0)
Alkaline Phosphatase: 137 U/L — ABNORMAL HIGH (ref 38–126)
Anion gap: 5 (ref 5–15)
BUN: 31 mg/dL — ABNORMAL HIGH (ref 8–23)
CO2: 22 mmol/L (ref 22–32)
Calcium: 7.5 mg/dL — ABNORMAL LOW (ref 8.9–10.3)
Chloride: 106 mmol/L (ref 98–111)
Creatinine, Ser: 0.98 mg/dL (ref 0.61–1.24)
GFR, Estimated: >60 mL/min (ref >60)
Glucose, Bld: 133 mg/dL — ABNORMAL HIGH (ref 70–99)
Potassium: 3.4 mmol/L — ABNORMAL LOW (ref 3.5–5.1)
Sodium: 133 mmol/L — ABNORMAL LOW (ref 135–145)
Total Bilirubin: 0.5 mg/dL (ref 0.3–1.2)
Total Protein: 4 g/dL — ABNORMAL LOW (ref 6.5–8.1)

## 2022-07-15 LAB — MAGNESIUM
Magnesium: 2 mg/dL (ref 1.7–2.4)
Magnesium: 1.9 mg/dL (ref 1.7–2.4)

## 2022-07-15 LAB — HEPARIN LEVEL (UNFRACTIONATED)
Heparin Unfractionated: 0.37 IU/mL (ref 0.30–0.70)
Heparin Unfractionated: 0.25 IU/mL — ABNORMAL LOW (ref 0.30–0.70)

## 2022-07-15 LAB — GLUCOSE, CAPILLARY
Glucose-Capillary: 168 mg/dL — ABNORMAL HIGH (ref 70–99)
Glucose-Capillary: 246 mg/dL — ABNORMAL HIGH (ref 70–99)
Glucose-Capillary: 308 mg/dL — ABNORMAL HIGH (ref 70–99)
Glucose-Capillary: 85 mg/dL (ref 70–99)

## 2022-07-15 LAB — VANCOMYCIN, PEAK: Vancomycin Pk: 23 ug/mL — ABNORMAL LOW (ref 30–40)

## 2022-07-15 LAB — VANCOMYCIN, TROUGH: Vancomycin Tr: 11 ug/mL — ABNORMAL LOW (ref 15–20)

## 2022-07-15 LAB — PHOSPHORUS
Phosphorus: 3.2 mg/dL (ref 2.5–4.6)
Phosphorus: 1.5 mg/dL — ABNORMAL LOW (ref 2.5–4.6)

## 2022-07-15 LAB — LACTIC ACID, PLASMA: Lactic Acid, Venous: 3.2 mmol/L (ref 0.5–1.9)

## 2022-07-15 MED ORDER — MIDODRINE HCL 5 MG PO TABS
10.0000 mg | ORAL_TABLET | ORAL | Status: AC
  Administered 2022-07-15: 10 mg via ORAL
  Filled 2022-07-15: qty 2

## 2022-07-15 MED ORDER — NOREPINEPHRINE 4 MG/250ML-% IV SOLN
INTRAVENOUS | Status: AC
  Filled 2022-07-15: qty 250

## 2022-07-15 MED ORDER — SODIUM CHLORIDE 0.9 % IV BOLUS
500.0000 mL | Freq: Once | INTRAVENOUS | Status: AC
  Administered 2022-07-15: 500 mL via INTRAVENOUS

## 2022-07-15 MED ORDER — POTASSIUM PHOSPHATES 15 MMOLE/5ML IV SOLN
30.0000 mmol | Freq: Once | INTRAVENOUS | Status: AC
  Administered 2022-07-16: 30 mmol via INTRAVENOUS
  Filled 2022-07-15: qty 10

## 2022-07-15 MED ORDER — SODIUM CHLORIDE 0.9 % IV SOLN: INTRAVENOUS | Status: DC

## 2022-07-15 MED ORDER — VANCOMYCIN HCL IN DEXTROSE 1-5 GM/200ML-% IV SOLN
1000.0000 mg | INTRAVENOUS | Status: DC
  Administered 2022-07-16 – 2022-07-18 (×3): 1000 mg via INTRAVENOUS
  Filled 2022-07-15 (×4): qty 200

## 2022-07-15 MED ORDER — SODIUM BICARBONATE 8.4 % IV SOLN
150.0000 meq | Freq: Once | INTRAVENOUS | Status: AC
  Administered 2022-07-15: 150 meq via INTRAVENOUS
  Filled 2022-07-15: qty 50

## 2022-07-15 MED ORDER — HEPARIN (PORCINE) 25000 UT/250ML-% IV SOLN
800.0000 [IU]/h | INTRAVENOUS | Status: AC
  Administered 2022-07-15: 800 [IU]/h via INTRAVENOUS
  Filled 2022-07-15: qty 250

## 2022-07-15 MED ORDER — STERILE WATER FOR INJECTION IV SOLN
INTRAVENOUS | Status: DC
  Filled 2022-07-15 (×2): qty 1000

## 2022-07-15 NOTE — Evaluation (Signed)
Occupational Therapy Evaluation Patient Details Name: Charles Jennings MRN: [id number] DOB: 1927/12/24 Today's Date: 07/15/2022   History of Present Illness Pt is a 89 year old male presenting to the ED with chief concerns of left second toe gangrene requiring amputation, now s/p aortogram with selective left lower extremity angiogram, mechanical thrombectomy of the left SFA, pop with popliteal artery, and tibial peroneal trunk2/8,  Left lower extremity angiogram, Mechanical thrombectomy of the left SFA, popliteal artery, tibioperoneal trunk, and proximal posterior tibial artery with the penumbra CAT 6 device, Stent placement to the left distal SFA and above-knee popliteal artery with 6 mm diameter by 15 cm length Viabahn stent for residual thrombus and stenosis after thrombectomy 2/9   Clinical Impression   Chart reviewed, pt ok'd for mobility by vascular MD, podiatry and RN. Pt is alert and oriented to self and place, increased time for processing throughout. Pt is a questionable historian, will continue to assess. Pt presents with deficits in strength, endurance, activity tolerance, balance all affecting safe and optimal ADL completion. Supine to sit completed with MOD-MAX A +1, STS with MAX A +1 with RW, uanble to maintain static standing. Pt reports dizziness seated on edge of bed, BP reading WNL, all other vitals WNL. MAX A for LB dressing, SET UP for grooming, feeding tasks. Recommend discharge to STR to address functional deficits. OT will continue to follow acutely. Pt is left in care of PT for hand off, all needs met, lines/leads intact.      Recommendations for follow up therapy are one component of a multi-disciplinary discharge planning process, led by the attending physician.  Recommendations may be updated based on patient status, additional functional criteria and insurance authorization.   Follow Up Recommendations  Skilled nursing-short term rehab (<3 hours/day)     Assistance  Recommended at Discharge Frequent or constant Supervision/Assistance  Patient can return home with the following A lot of help with walking and/or transfers;A lot of help with bathing/dressing/bathroom    Functional Status Assessment  Patient has had a recent decline in their functional status and demonstrates the ability to make significant improvements in function in a reasonable and predictable amount of time.  Equipment Recommendations  Other (comment) (per next venue of care)    Recommendations for Other Services       Precautions / Restrictions Precautions Precautions: Fall Restrictions Weight Bearing Restrictions: Yes Other Position/Activity Restrictions: heel WB in post op shoe per Dr Vickki Muff      Mobility Bed Mobility Overal bed mobility: Needs Assistance Bed Mobility: Supine to Sit, Sit to Supine     Supine to sit: HOB elevated, Mod assist, Max assist Sit to supine: Max assist   General bed mobility comments: frequent vcs for technique    Transfers Overall transfer level: Needs assistance Equipment used: Rolling walker (2 wheels) Transfers: Sit to/from Stand Sit to Stand: Max assist, From elevated surface           General transfer comment: good adherence to WBing precautions      Balance Overall balance assessment: Needs assistance Sitting-balance support: Feet supported Sitting balance-Leahy Scale: Fair     Standing balance support: Bilateral upper extremity supported Standing balance-Leahy Scale: Zero                             ADL either performed or assessed with clinical judgement   ADL Overall ADL's : Needs assistance/impaired Eating/Feeding: Set up;Sitting   Grooming: Wash/dry  hands;Wash/dry face;Sitting           Upper Body Dressing : Moderate assistance   Lower Body Dressing: Maximal assistance Lower Body Dressing Details (indicate cue type and reason): post op shoe     Toileting- Clothing Manipulation and  Hygiene: Maximal assistance;Bed level               Vision Patient Visual Report: No change from baseline       Perception     Praxis      Pertinent Vitals/Pain Pain Assessment Pain Assessment: CPOT Facial Expression: Relaxed, neutral Body Movements: Absence of movements Muscle Tension: Tense, rigid Compliance with ventilator (intubated pts.): N/A Vocalization (extubated pts.): Talking in normal tone or no sound CPOT Total: 1 Pain Intervention(s): Monitored during session, Repositioned     Hand Dominance     Extremity/Trunk Assessment Upper Extremity Assessment Upper Extremity Assessment: Generalized weakness   Lower Extremity Assessment Lower Extremity Assessment: Defer to PT evaluation;Generalized weakness       Communication Communication Communication: Other (comment) (?dysarthria/slurred speech)   Cognition Arousal/Alertness: Awake/alert Behavior During Therapy: WFL for tasks assessed/performed Overall Cognitive Status: No family/caregiver present to determine baseline cognitive functioning Area of Impairment: Orientation, Memory, Following commands, Safety/judgement, Awareness, Problem solving                 Orientation Level: Disoriented to, Time   Memory: Decreased short-term memory Following Commands: Follows one step commands with increased time Safety/Judgement: Decreased awareness of deficits, Decreased awareness of safety Awareness: Emergent Problem Solving: Slow processing, Requires verbal cues, Requires tactile cues General Comments: ?historian however will continue to assess     General Comments  vitals monitored, stable throughout; all lines/leads intact pre/post session    Exercises Other Exercises Other Exercises: e   Shoulder Instructions      Home Living Family/patient expects to be discharged to:: Private residence Living Arrangements: Alone Available Help at Discharge: Family;Friend(s);Available  PRN/intermittently Type of Home: Apartment Home Access: Level entry     Home Layout: One level     Bathroom Shower/Tub: Teacher, early years/pre: Handicapped height     Home Equipment: Rollator (4 wheels)   Additional Comments: ?historian, some information from previous admission. Pt endoses he lives on a first floor apartment and has family to assist as needed but he does not like to bother him      Prior Functioning/Environment Prior Level of Function : Independent/Modified Independent             Mobility Comments: pt report use of AE for amb, recent fall history per chart; prior to appro x6 months ago pt reports he was amb without AD walking up to 6 miles per day ADLs Comments: MOD I-I in ADL, assist for IADL        OT Problem List: Decreased strength;Impaired balance (sitting and/or standing);Decreased activity tolerance;Decreased safety awareness;Decreased knowledge of precautions;Decreased knowledge of use of DME or AE      OT Treatment/Interventions: Self-care/ADL training;Patient/family education;Therapeutic exercise;Balance training;Energy conservation;Therapeutic activities;DME and/or AE instruction    OT Goals(Current goals can be found in the care plan section) Acute Rehab OT Goals Patient Stated Goal: get stronger OT Goal Formulation: With patient Time For Goal Achievement: 07/29/22 Potential to Achieve Goals: Good ADL Goals Pt Will Perform Grooming: sitting;standing;with min guard assist Pt Will Perform Lower Body Dressing: with min guard assist Pt Will Transfer to Toilet: with min guard assist Pt Will Perform Toileting - Clothing Manipulation and hygiene: with min  guard assist  OT Frequency: Min 2X/week    Co-evaluation              AM-PAC OT "6 Clicks" Daily Activity     Outcome Measure Help from another person eating meals?: None Help from another person taking care of personal grooming?: A Little Help from another person  toileting, which includes using toliet, bedpan, or urinal?: A Lot Help from another person bathing (including washing, rinsing, drying)?: A Lot Help from another person to put on and taking off regular upper body clothing?: A Little Help from another person to put on and taking off regular lower body clothing?: A Lot 6 Click Score: 16   End of Session Equipment Utilized During Treatment: Rolling walker (2 wheels) Nurse Communication: Mobility status  Activity Tolerance: Patient tolerated treatment well Patient left: in bed;with call bell/phone within reach (with PT present)  OT Visit Diagnosis: Unsteadiness on feet (R26.81);Other abnormalities of gait and mobility (R26.89);History of falling (Z91.81);Muscle weakness (generalized) (M62.81)                Time: [id number] OT Time Calculation (min): 20 min Charges:  OT General Charges $OT Visit: 1 Visit OT Evaluation $OT Eval High Complexity: 1 High  Shanon Payor, OTD OTR/L  07/15/22, 2:24 PM

## 2022-07-15 NOTE — Progress Notes (Signed)
  Progress Note   Patient: Charles Jennings [id number] DOB: [birth year] DOA: 07/12/2022     3 DOS: the patient was seen and examined on 07/15/2022   Brief hospital course:  Assessment and Plan: * Osteomyelitis w/ peripheral arterial disease (HCC) - IV NS 100 cc/hr  - IV cefepime 2 g q12  - IV vancomycin 750 mg daily  - IV flagyl 500 mg q12  - IV heparin drip  - IV morphine 2 mg q4 hr PRN  - Senna S 1 tablet PO qhs PRN  Insulin dependent type 2 diabetes mellitus (HCC) - Novolog SS ACHS - Gabapentin 300 mg PO daily   Mixed hyperlipidemia - Lipitor 40 mg PO daily   Benign essential HTN - Diltiazem CD 240 mg PO daily  - Lisinopril 40 mg PO daily   Weakness - PT, OT ordered  Stage 3a chronic kidney disease (CKD) ([place]) - IV fluids as above   DVT prophylaxis: IV heparin drip as above      Subjective: Pt seen and examined at the bedside. He is on heparin drip and continues on IV antibx's s/p LL extremity angiogram and mechanical thrombectomy of the L SFA and popliteal artery with stent placement to the L distal SFA and popliteal artery.  Physical Exam: Vitals:   07/15/22 0830 07/15/22 0900 07/15/22 0930 07/15/22 1000  BP: (!) 83/41 (!) 81/55 (!) 86/31 (!) 94/50  Pulse: 60 61 65 66  Resp: '11 13 17 17  '$ Temp:      TempSrc:      SpO2: 94% 98% 95% 100%  Weight:      Height:       Physical Exam Constitutional:      Appearance: Normal appearance.  HENT:     Head: Normocephalic and atraumatic.     Mouth/Throat:     Mouth: Mucous membranes are moist.  Cardiovascular:     Rate and Rhythm: Normal rate and regular rhythm.  Pulmonary:     Effort: Pulmonary effort is normal.  Abdominal:     General: Abdomen is flat.  Musculoskeletal:     Cervical back: Neck supple.  Neurological:     Mental Status: Mental status is at baseline.  Psychiatric:        Mood and Affect: Mood normal.    Data Reviewed:   Disposition: Status is: Inpatient  Planned Discharge  Destination: Barriers to discharge: Healing of LL extremity wound     Time spent: 35 minutes  Author: Lucienne Minks , MD 07/15/2022 11:24 AM  For on call review [email or web address].

## 2022-07-15 NOTE — Consult Note (Signed)
Pharmacy Antibiotic Note  Charles Jennings is a 89 y.o. male with PMH including diabetes, lower limb ischemia, left third toe amputation 06/30/22 admitted on 07/12/2022 with  gangrenous changes to second toe on left foot with cellulitis .  Pharmacy has been consulted for vancomycin and cefepime dosing. Patient is also ordered metronidazole. Since admission renal function has improved  Levels resulted:  Vanc peak 23, vanc trough 11. Patient specific AUC: 398.1, Cmin 10.8, Ke 0.0345   Plan:  1) continue cefepime 2 g IV q12h  2) Increase vancomycin to 1,000 mg IV q24h Goal AUC 400-600 Predicted patient-specific levels based on new dosing Predicted AUC: 530.8 Predicted Cmin 14.4  --Levels at steady state or as clinically indicated  Height: 5' 7.5" (171.5 cm) Weight: 65.5 kg (144 lb 6.4 oz) IBW/kg (Calculated) : 67.25  Temp (24hrs), Avg:97.3 F (36.3 C), Min:96.6 F (35.9 C), Max:97.7 F (36.5 C)  Recent Labs  Lab 07/12/22 1130 07/12/22 1200 07/12/22 1442 07/13/22 0504 07/13/22 1822 07/14/22 0510 07/15/22 0542 07/15/22 1434 07/15/22 1713  WBC 16.8*  --   --  11.1* 12.7* 10.5  --   --   --   CREATININE 1.36*  --   --  0.97  --  0.98 0.99  --   --   LATICACIDVEN  --  1.8 1.7  --   --   --   --   --   --   VANCOTROUGH  --   --   --   --   --   --   --  11*  --   VANCOPEAK  --   --   --   --   --   --   --   --  23*     Estimated Creatinine Clearance: 44.1 mL/min (by C-G formula based on SCr of 0.99 mg/dL).    No Known Allergies  Antimicrobials this admission: cefepime 2/7 >>  vancomycin 2/7 >>  metronidazole 2/7 >>   Microbiology results: 2/7 BCx: NGTD 2/8 MRSA PCR: negative  Thank you for allowing pharmacy to be a part of this patient's care.  Wynelle Cleveland 07/15/2022 6:09 PM

## 2022-07-15 NOTE — Progress Notes (Signed)
Daily Progress Note   Subjective  - 1 Day Post-Op  Follow-up left foot necrotic tissue and gangrene left second toe.  Underwent revascularization.  Increase circulation per vascular notes at this time.  Objective Vitals:   07/15/22 1200 07/15/22 1230 07/15/22 1300 07/15/22 1400  BP: (!) 113/41 (!) 120/43 (!) 103/59 (!) 117/51  Pulse: 63 66 73 73  Resp: 13 14 16 11  $ Temp:      TempSrc:      SpO2: 99% 99% 100% 100%  Weight:      Height:        Physical Exam: Gangrenous changes to the distal second toe.  Large hemorrhagic bullous on the dorsal aspect of the left foot with surrounding erythema.  Necrotic tissue at the previous third toe amputation site.  Laboratory CBC    Component Value Date/Time   WBC 10.5 07/14/2022 0510   HGB 13.4 07/14/2022 0510   HGB 15.5 01/22/2013 1546   HCT 40.9 07/14/2022 0510   HCT 45.4 01/22/2013 1546   PLT 152 07/14/2022 0510   PLT 203 01/22/2013 1546    BMET    Component Value Date/Time   NA 138 07/15/2022 0542   NA 137 01/22/2013 1546   K 4.3 07/15/2022 0542   K 3.9 01/22/2013 1546   CL 105 07/15/2022 0542   CL 104 01/22/2013 1546   CO2 20 (L) 07/15/2022 0542   CO2 28 01/22/2013 1546   GLUCOSE 177 (H) 07/15/2022 0542   GLUCOSE 211 (H) 01/22/2013 1546   BUN 28 (H) 07/15/2022 0542   BUN 18 01/22/2013 1546   CREATININE 0.99 07/15/2022 0542   CREATININE 1.35 (H) 01/22/2013 1546   CALCIUM 8.1 (L) 07/15/2022 0542   CALCIUM 8.9 01/22/2013 1546   GFRNONAA >60 07/15/2022 0542   GFRNONAA 48 (L) 01/22/2013 1546   GFRAA >60 01/28/2016 1259   GFRAA 56 (L) 01/22/2013 1546    Assessment/Planning: Gangrene left second toe with likely deep space abscess previous third toe amputation site Severe peripheral vascular disease  At this point we will plan on I&D of the left foot as well as amputation of the left second toe. Concern for worsening infection in the area.  I discussed with the patient the risks associated with the surgery.  Consent  was given.  Orders will be placed at this time.  I will see patient tomorrow for surgery.  Samara Deist A  07/15/2022, 3:17 PM

## 2022-07-15 NOTE — Evaluation (Signed)
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  Pt diagnosed with gangrene to the left second toe with pt now s/p LLE revascularization procedure with amputation likely during this admission pending results of revascularization.   Clinical Impression  Pt was pleasant and motivated to participate during the session and put forth good effort throughout. Pt required extensive physical assistance with bed mobility tasks and demonstrated fair stability in sitting at EOB.  Pt declined to attempt to stand secondary to fatigue with pt having just finished working with OT.  Pt reported no pain during the session with SpO2 and HR WNL throughout.  Per chart review pt to potentially have toe amputation pending results of revascularization procedure.  Pt will benefit from PT services in a SNF setting upon discharge to safely address deficits listed in patient problem list for decreased caregiver assistance and eventual return to PLOF.         Recommendations for follow up therapy are one component of a multi-disciplinary discharge planning process, led by the attending physician.  Recommendations may be updated based on patient status, additional functional criteria and insurance authorization.  Follow Up Recommendations Skilled nursing-short term rehab (<3 hours/day) Can patient physically be transported by private vehicle: No    Assistance Recommended at Discharge Frequent or constant Supervision/Assistance  Patient can return home with the following  A lot of help with walking and/or transfers;A lot of help with bathing/dressing/bathroom;Assistance with cooking/housework;Direct supervision/assist  for medications management;Assist for transportation;Help with stairs or ramp for entrance;Direct supervision/assist for financial management    Equipment Recommendations Other (comment) (TBD at next venue of care)  Recommendations for Other Services       Functional Status Assessment Patient has had a recent decline in their functional status and demonstrates the ability to make significant improvements in function in a reasonable and predictable amount of time.     Precautions / Restrictions Precautions Precautions: Fall Restrictions Weight Bearing Restrictions: Yes Other Position/Activity Restrictions: L heel WB in post op shoe per Dr Vickki Muff      Mobility  Bed Mobility Overal bed mobility: Needs Assistance Bed Mobility: Supine to Sit, Sit to Supine     Supine to sit: HOB elevated, Mod assist, Max assist Sit to supine: Max assist   General bed mobility comments: Mod to max A for BLE and trunk control    Transfers                   General transfer comment: Pt declined to attempt to stand secondary to fatigue    Ambulation/Gait                  Stairs            Wheelchair Mobility    Modified Rankin (Stroke Patients Only)       Balance Overall balance assessment: Needs assistance Sitting-balance support: Feet supported Sitting balance-Leahy Scale: Fair                                       Pertinent Vitals/Pain Pain Assessment Pain Assessment: No/denies pain    Home Living Family/patient expects to be discharged  to:: Private residence Living Arrangements: Alone Available Help at Discharge: Family;Friend(s);Available PRN/intermittently Type of Home: Apartment Home Access: Level entry       Home Layout: One level Home Equipment: Rollator (4 wheels);Rolling Walker (2 wheels) Additional Comments: Pt with difficulty providing history, information obtained both from patient as well as chart review    Prior Function  Prior Level of Function : Independent/Modified Independent             Mobility Comments: Pt stated that he owns a rollator and a RW and has been primarily using the RW recently ADLs Comments: Ind with ADLs, assist from family with IADLs     Hand Dominance        Extremity/Trunk Assessment   Upper Extremity Assessment Upper Extremity Assessment: Generalized weakness    Lower Extremity Assessment Lower Extremity Assessment: Generalized weakness       Communication   Communication: No difficulties  Cognition Arousal/Alertness: Awake/alert Behavior During Therapy: WFL for tasks assessed/performed Overall Cognitive Status: No family/caregiver present to determine baseline cognitive functioning                                          General Comments General comments (skin integrity, edema, etc.): vitals monitored, stable throughout; all lines/leads intact pre/post session    Exercises Total Joint Exercises Ankle Circles/Pumps: AROM, Both, 10 reps (limited amplitude on the LLE) Quad Sets: Strengthening, Both, 10 reps Short Arc Quad: AAROM, Strengthening, Both, 10 reps, AROM Hip ABduction/ADduction: AAROM, Strengthening, Left, 5 reps, 10 reps Straight Leg Raises: AAROM, Strengthening, Left, 5 reps, 10 reps   Assessment/Plan    PT Assessment Patient needs continued PT services  PT Problem List Decreased strength;Decreased activity tolerance;Decreased balance;Decreased mobility;Decreased knowledge of use of DME;Decreased safety awareness;Decreased knowledge of precautions       PT Treatment Interventions DME instruction;Gait training;Functional mobility training;Therapeutic activities;Therapeutic exercise;Balance training;Patient/family education    PT Goals (Current goals can be found in the Care Plan section)  Acute Rehab PT Goals Patient Stated Goal: To get stronger PT Goal Formulation: With patient Time For Goal Achievement: 07/28/22 Potential  to Achieve Goals: Good    Frequency Min 2X/week     Co-evaluation               AM-PAC PT "6 Clicks" Mobility  Outcome Measure Help needed turning from your back to your side while in a flat bed without using bedrails?: A Lot Help needed moving from lying on your back to sitting on the side of a flat bed without using bedrails?: Total Help needed moving to and from a bed to a chair (including a wheelchair)?: Total Help needed standing up from a chair using your arms (e.g., wheelchair or bedside chair)?: Total Help needed to walk in hospital room?: Total Help needed climbing 3-5 steps with a railing? : Total 6 Click Score: 7    End of Session Equipment Utilized During Treatment: Gait belt Activity Tolerance: Patient tolerated treatment well Patient left: in bed;with call bell/phone within reach;with bed alarm set Nurse Communication: Mobility status PT Visit Diagnosis: Difficulty in walking, not elsewhere classified (R26.2);Muscle weakness (generalized) (M62.81)    Time: 8588-5027 PT Time Calculation (min) (ACUTE ONLY): 24 min   Charges:   PT Evaluation $PT Eval Moderate Complexity: 1 Mod PT Treatments $Therapeutic Exercise: 8-22 mins       D. Royetta Asal PT, DPT 07/15/22, 3:25  PM   

## 2022-07-15 NOTE — Consult Note (Addendum)
ANTICOAGULATION CONSULT NOTE - Initial Consult  Pharmacy Consult for Heparin Indication:  lower limb ischemia   No Known Allergies  Patient Measurements: Height: 5' 7.5" (171.5 cm) Weight: 65.5 kg (144 lb 6.4 oz) IBW/kg (Calculated) : 67.25 Heparin Dosing Weight: 65.5 kg  Vital Signs: Temp: 96.6 F (35.9 C) (02/10 0400) Temp Source: Axillary (02/10 0400) BP: 81/55 (02/10 0900) Pulse Rate: 61 (02/10 0900)  Labs: Recent Labs    07/12/22 1130 07/12/22 1201 07/13/22 0504 07/13/22 1822 07/14/22 0510 07/15/22 0542  HGB 17.1*  --  14.4 13.9 13.4  --   HCT 51.7  --  42.8 42.1 40.9  --   PLT 211  --  166 188 152  --   APTT  --  29  --   --   --   --   LABPROT  --  13.7  --   --   --   --   INR  --  1.1  --   --   --   --   CREATININE 1.36*  --  0.97  --  0.98 0.99  CKTOTAL 66  --   --   --   --   --     Estimated Creatinine Clearance: 44.1 mL/min (by C-G formula based on SCr of 0.99 mg/dL).   Medical History: Past Medical History:  Diagnosis Date   (HFpEF) heart failure with preserved ejection fraction (HCC)    Arthritis    Complete heart block ([place])    a.) s/p dual chamber Medtronic PPM placement 01/31/2016   Frequent PVCs    Hemorrhoids    a.) s/p banding 01/2021   HLD (hyperlipidemia)    Hypertension    Long term current use of antithrombotics/antiplatelets    a.) on DAPT (ASA + clopidogrel)   Peripheral vascular disease ([place])    Presence of permanent cardiac pacemaker 01/31/2016   a.) s/p Advisa DR MRI SureScan dual chamber PPM (SN: [id number])   SDH (subdural hematoma) (HCC)    Skin cancer    Type 2 diabetes mellitus treated with insulin (HCC)     Medications:  Medications Prior to Admission  Medication Sig Dispense Refill Last Dose   atorvastatin (LIPITOR) 40 MG tablet Take 1 tablet (40 mg total) by mouth every evening. 30 tablet 0 07/11/2022   clopidogrel (PLAVIX) 75 MG tablet Take 1 tablet (75 mg total) by mouth daily. 60 tablet 0 07/12/2022   diltiazem  (CARDIZEM CD) 240 MG 24 hr capsule Take 240 mg by mouth daily.   07/12/2022   gabapentin (NEURONTIN) 300 MG capsule Take 300 mg by mouth daily.   07/12/2022   JARDIANCE 10 MG TABS tablet Take 10 mg by mouth daily.   07/12/2022   lisinopril (ZESTRIL) 40 MG tablet Take 40 mg by mouth daily.   07/12/2022   loratadine (CLARITIN) 10 MG tablet Take 1 tablet by mouth daily.   07/12/2022   metFORMIN (GLUCOPHAGE) 500 MG tablet Take 500 mg by mouth 2 (two) times daily with a meal.   07/12/2022   NOVOLIN 70/30 (70-30) 100 UNIT/ML injection Inject 10 Units into the skin daily with breakfast. (Patient taking differently: Inject into the skin daily with breakfast. Inject subcutaneously once daily. Sliding scale) 10 mL 11 07/12/2022   Scheduled:   atorvastatin  40 mg Oral QPM   Chlorhexidine Gluconate Cloth  6 each Topical Daily   diltiazem  240 mg Oral Daily   gabapentin  300 mg Oral Daily   insulin aspart  0-5 Units Subcutaneous QHS   insulin aspart  0-9 Units Subcutaneous TID WC   lisinopril  40 mg Oral Daily   tirofiban  25 mcg/kg Intravenous Once   Infusions:   sodium chloride 100 mL/hr at 07/15/22 0908   ceFEPime (MAXIPIME) IV Stopped (07/14/22 2310)   metronidazole Stopped (07/15/22 0147)   tirofiban 0.075 mcg/kg/min (07/15/22 0700)   vancomycin Stopped (07/14/22 1700)   PRN: acetaminophen **OR** acetaminophen, fentaNYL (SUBLIMAZE) injection, hydrALAZINE, HYDROmorphone (DILAUDID) injection, morphine injection, ondansetron **OR** ondansetron (ZOFRAN) IV, ondansetron (ZOFRAN) IV, senna-docusate Anti-infectives (From admission, onward)    Start     Dose/Rate Route Frequency Ordered Stop   07/13/22 1603  ceFAZolin (ANCEF) IVPB 2g/100 mL premix        2 g 200 mL/hr over 30 Minutes Intravenous 30 min pre-op 07/13/22 1603 07/13/22 1718   07/13/22 1400  vancomycin (VANCOREADY) IVPB 750 mg/150 mL        750 mg 150 mL/hr over 60 Minutes Intravenous Every 24 hours 07/12/22 1328     07/12/22 2200  ceFEPIme  (MAXIPIME) 2 g in sodium chloride 0.9 % 100 mL IVPB        2 g 200 mL/hr over 30 Minutes Intravenous Every 12 hours 07/12/22 1328     07/12/22 1215  vancomycin (VANCOREADY) IVPB 1500 mg/300 mL        1,500 mg 150 mL/hr over 120 Minutes Intravenous  Once 07/12/22 1207 07/12/22 1548   07/12/22 1215  ceFEPIme (MAXIPIME) 2 g in sodium chloride 0.9 % 100 mL IVPB        2 g 200 mL/hr over 30 Minutes Intravenous  Once 07/12/22 1207 07/12/22 1315   07/12/22 1200  metroNIDAZOLE (FLAGYL) IVPB 500 mg        500 mg 100 mL/hr over 60 Minutes Intravenous Every 12 hours 07/12/22 1159 07/19/22 1159       Assessment: 89 y.o. male Charles Jennings is a 89 y.o. male with a history of diabetes, lower limb ischemia amputation sent in for evaluation by podiatrist. Patient underwent two lower extremity angiography (2/8 and 2/9). Podiatry may plan for amputation on Sunday if patient remains stable.    Goal of Therapy:  Heparin level 0.3-0.7 units/ml Monitor platelets by anticoagulation protocol: Yes   Plan:  Start heparin infusion at 800 units/hr Check anti-Xa level in 8 hours and daily while on heparin Continue to monitor H&H and platelets  Discussed with Dr. Trula Slade, plan is to send pt home on asa and apixaban. Will start heparin for now while podiatry surgery is pending. Messaged podiatry if asa is ok to start on 2/10.   Oswald Hillock, PharmD, BCPS 07/15/2022,10:09 AM

## 2022-07-15 NOTE — Progress Notes (Signed)
Subjective  - POD #1, status post mechanical thrombectomy of the left SFA, popliteal and tibial vessels with stent placement of the SFA and above-knee popliteal artery Complaining of pain in his toes.  His leg feels better   Physical Exam:  Brisk pedal Doppler signals Persistent ischemic digits       Assessment/Plan:  POD #1  Successful revascularization with excellent blood flow to the ankle.  Podiatry is planning for digital amputation tomorrow.  He has been transitioned to heparin.  [place] Charles Jennings 07/15/2022 4:26 PM --  Vitals:   07/15/22 1500 07/15/22 1600  BP: (!) 101/42 (!) 120/48  Pulse: 68 66  Resp: 14 14  Temp:    SpO2: 98% 99%    Intake/Output Summary (Last 24 hours) at 07/15/2022 1626 Last data filed at 07/15/2022 1300 Gross per 24 hour  Intake 1453.58 ml  Output 750 ml  Net 703.58 ml     Laboratory CBC    Component Value Date/Time   WBC 10.5 07/14/2022 0510   HGB 13.4 07/14/2022 0510   HGB 15.5 01/22/2013 1546   HCT 40.9 07/14/2022 0510   HCT 45.4 01/22/2013 1546   PLT 152 07/14/2022 0510   PLT 203 01/22/2013 1546    BMET    Component Value Date/Time   NA 138 07/15/2022 0542   NA 137 01/22/2013 1546   K 4.3 07/15/2022 0542   K 3.9 01/22/2013 1546   CL 105 07/15/2022 0542   CL 104 01/22/2013 1546   CO2 20 (L) 07/15/2022 0542   CO2 28 01/22/2013 1546   GLUCOSE 177 (H) 07/15/2022 0542   GLUCOSE 211 (H) 01/22/2013 1546   BUN 28 (H) 07/15/2022 0542   BUN 18 01/22/2013 1546   CREATININE 0.99 07/15/2022 0542   CREATININE 1.35 (H) 01/22/2013 1546   CALCIUM 8.1 (L) 07/15/2022 0542   CALCIUM 8.9 01/22/2013 1546   GFRNONAA >60 07/15/2022 0542   GFRNONAA 48 (L) 01/22/2013 1546   GFRAA >60 01/28/2016 1259   GFRAA 56 (L) 01/22/2013 1546    COAG Lab Results  Component Value Date   INR 1.1 07/12/2022   INR 1.0 06/11/2022   No results found for: "PTT"  Antibiotics Anti-infectives (From admission, onward)    Start     Dose/Rate  Route Frequency Ordered Stop   07/13/22 1603  ceFAZolin (ANCEF) IVPB 2g/100 mL premix        2 g 200 mL/hr over 30 Minutes Intravenous 30 min pre-op 07/13/22 1603 07/13/22 1718   07/13/22 1400  vancomycin (VANCOREADY) IVPB 750 mg/150 mL        750 mg 150 mL/hr over 60 Minutes Intravenous Every 24 hours 07/12/22 1328     07/12/22 2200  ceFEPIme (MAXIPIME) 2 g in sodium chloride 0.9 % 100 mL IVPB        2 g 200 mL/hr over 30 Minutes Intravenous Every 12 hours 07/12/22 1328     07/12/22 1215  vancomycin (VANCOREADY) IVPB 1500 mg/300 mL        1,500 mg 150 mL/hr over 120 Minutes Intravenous  Once 07/12/22 1207 07/12/22 1548   07/12/22 1215  ceFEPIme (MAXIPIME) 2 g in sodium chloride 0.9 % 100 mL IVPB        2 g 200 mL/hr over 30 Minutes Intravenous  Once 07/12/22 1207 07/12/22 1315   07/12/22 1200  metroNIDAZOLE (FLAGYL) IVPB 500 mg        500 mg 100 mL/hr over 60 Minutes Intravenous Every 12 hours 07/12/22  1159 07/19/22 1159        V. Leia Alf, M.D., [facility] [place] Office: [phone] Pager:  [phone]

## 2022-07-15 NOTE — Consult Note (Signed)
ANTICOAGULATION CONSULT NOTE - Initial Consult  Pharmacy Consult for Heparin Indication:  lower limb ischemia   No Known Allergies  Patient Measurements: Height: 5' 7.5" (171.5 cm) Weight: 65.5 kg (144 lb 6.4 oz) IBW/kg (Calculated) : 67.25 Heparin Dosing Weight: 65.5 kg  Vital Signs: Temp: 97.7 F (36.5 C) (02/10 2000) Temp Source: Oral (02/10 2000) BP: 116/57 (02/10 2000) Pulse Rate: 77 (02/10 2000)  Labs: Recent Labs    07/13/22 0504 07/13/22 1822 07/14/22 0510 07/15/22 0542 07/15/22 1938  HGB 14.4 13.9 13.4  --   --   HCT 42.8 42.1 40.9  --   --   PLT 166 188 152  --   --   HEPARINUNFRC  --   --   --   --  0.37  CREATININE 0.97  --  0.98 0.99  --      Estimated Creatinine Clearance: 44.1 mL/min (by C-G formula based on SCr of 0.99 mg/dL).   Medical History: Past Medical History:  Diagnosis Date   (HFpEF) heart failure with preserved ejection fraction (HCC)    Arthritis    Complete heart block ([place])    a.) s/p dual chamber Medtronic PPM placement 01/31/2016   Frequent PVCs    Hemorrhoids    a.) s/p banding 01/2021   HLD (hyperlipidemia)    Hypertension    Long term current use of antithrombotics/antiplatelets    a.) on DAPT (ASA + clopidogrel)   Peripheral vascular disease ([place])    Presence of permanent cardiac pacemaker 01/31/2016   a.) s/p Advisa DR MRI SureScan dual chamber PPM (SN: [id number])   SDH (subdural hematoma) (HCC)    Skin cancer    Type 2 diabetes mellitus treated with insulin (HCC)     Medications:  Medications Prior to Admission  Medication Sig Dispense Refill Last Dose   atorvastatin (LIPITOR) 40 MG tablet Take 1 tablet (40 mg total) by mouth every evening. 30 tablet 0 07/11/2022   clopidogrel (PLAVIX) 75 MG tablet Take 1 tablet (75 mg total) by mouth daily. 60 tablet 0 07/12/2022   diltiazem (CARDIZEM CD) 240 MG 24 hr capsule Take 240 mg by mouth daily.   07/12/2022   gabapentin (NEURONTIN) 300 MG capsule Take 300 mg by mouth daily.    07/12/2022   JARDIANCE 10 MG TABS tablet Take 10 mg by mouth daily.   07/12/2022   lisinopril (ZESTRIL) 40 MG tablet Take 40 mg by mouth daily.   07/12/2022   loratadine (CLARITIN) 10 MG tablet Take 1 tablet by mouth daily.   07/12/2022   metFORMIN (GLUCOPHAGE) 500 MG tablet Take 500 mg by mouth 2 (two) times daily with a meal.   07/12/2022   NOVOLIN 70/30 (70-30) 100 UNIT/ML injection Inject 10 Units into the skin daily with breakfast. (Patient taking differently: Inject into the skin daily with breakfast. Inject subcutaneously once daily. Sliding scale) 10 mL 11 07/12/2022   Scheduled:   atorvastatin  40 mg Oral QPM   Chlorhexidine Gluconate Cloth  6 each Topical Daily   diltiazem  240 mg Oral Daily   gabapentin  300 mg Oral Daily   insulin aspart  0-5 Units Subcutaneous QHS   insulin aspart  0-9 Units Subcutaneous TID WC   lisinopril  40 mg Oral Daily   tirofiban  25 mcg/kg Intravenous Once   Infusions:   sodium chloride 75 mL/hr at 07/15/22 2002   ceFEPime (MAXIPIME) IV Stopped (07/15/22 1222)   heparin 800 Units/hr (07/15/22 2000)   metronidazole Stopped (  07/15/22 1359)   [START ON 07/16/2022] vancomycin     PRN: acetaminophen **OR** acetaminophen, fentaNYL (SUBLIMAZE) injection, hydrALAZINE, HYDROmorphone (DILAUDID) injection, morphine injection, ondansetron **OR** ondansetron (ZOFRAN) IV, ondansetron (ZOFRAN) IV, senna-docusate Anti-infectives (From admission, onward)    Start     Dose/Rate Route Frequency Ordered Stop   07/16/22 1400  vancomycin (VANCOCIN) IVPB 1000 mg/200 mL premix        1,000 mg 200 mL/hr over 60 Minutes Intravenous Every 24 hours 07/15/22 1813     07/13/22 1603  ceFAZolin (ANCEF) IVPB 2g/100 mL premix        2 g 200 mL/hr over 30 Minutes Intravenous 30 min pre-op 07/13/22 1603 07/13/22 1718   07/13/22 1400  vancomycin (VANCOREADY) IVPB 750 mg/150 mL  Status:  Discontinued        750 mg 150 mL/hr over 60 Minutes Intravenous Every 24 hours 07/12/22 1328 07/15/22 1813    07/12/22 2200  ceFEPIme (MAXIPIME) 2 g in sodium chloride 0.9 % 100 mL IVPB        2 g 200 mL/hr over 30 Minutes Intravenous Every 12 hours 07/12/22 1328     07/12/22 1215  vancomycin (VANCOREADY) IVPB 1500 mg/300 mL        1,500 mg 150 mL/hr over 120 Minutes Intravenous  Once 07/12/22 1207 07/12/22 1548   07/12/22 1215  ceFEPIme (MAXIPIME) 2 g in sodium chloride 0.9 % 100 mL IVPB        2 g 200 mL/hr over 30 Minutes Intravenous  Once 07/12/22 1207 07/12/22 1315   07/12/22 1200  metroNIDAZOLE (FLAGYL) IVPB 500 mg        500 mg 100 mL/hr over 60 Minutes Intravenous Every 12 hours 07/12/22 1159 07/19/22 1159       Assessment: 89 y.o. male DACARRI SURFACE is a 89 y.o. male with a history of diabetes, lower limb ischemia amputation sent in for evaluation by podiatrist. Patient underwent two lower extremity angiography (2/8 and 2/9). Podiatry may plan for amputation on Sunday if patient remains stable.    Discussed with Dr. Trula Slade, plan is to send pt home on asa and apixaban. Will start heparin for now while podiatry surgery is pending. Messaged podiatry if asa is ok to start on 2/10.   Goal of Therapy:  Heparin level 0.3-0.7 units/ml Monitor platelets by anticoagulation protocol: Yes  Date Time HL Rate/Comment 2/10 1938 0.37 Therapeutic x1 / 800 u/hr  Plan:  Continue heparin infusion at 800 units/hr Check anti-Xa level in 8 hours and daily once consecutively therapeutic. Continue to monitor H&H and platelets daily while on heparin gtt.   [facility] Pharmacist 07/15/2022 8:33 PM

## 2022-07-15 NOTE — Progress Notes (Signed)
1930 patient alert and orientated on room air heparin infusing. Doppler pulses only to BLE unable to draw blood from CVC line flushed all lumen and placed a safety set per policy  [id number] BP dropped patient asleep woke patient and check BP both arms and manuel same BP called vascular 567m bolus and stat HBG patient had to arouse but wakes up some HOH noted ordered Vascular asked that [facility] be called they are first to manage called [facility] team lots new orders BP improved patient awake and answering questions following command as he did at shift change.  05/15/23 0000 Patient BP dropping again new orders

## 2022-07-16 ENCOUNTER — Other Ambulatory Visit: Payer: Self-pay

## 2022-07-16 ENCOUNTER — Inpatient Hospital Stay: Payer: Medicare Other | Admitting: Anesthesiology

## 2022-07-16 ENCOUNTER — Encounter
Admission: EM | Disposition: A | Discharge status: Skilled Nursing Facility | Payer: Self-pay | Source: Ambulatory Visit | Attending: Student

## 2022-07-16 DIAGNOSIS — M869 Osteomyelitis, unspecified: Secondary | ICD-10-CM | POA: Diagnosis not present

## 2022-07-16 HISTORY — PX: AMPUTATION TOE: SHX6595

## 2022-07-16 LAB — HEPARIN LEVEL (UNFRACTIONATED)
Heparin Unfractionated: 0.36 IU/mL (ref 0.30–0.70)
Heparin Unfractionated: 0.25 IU/mL — ABNORMAL LOW (ref 0.30–0.70)

## 2022-07-16 LAB — GLUCOSE, CAPILLARY
Glucose-Capillary: 171 mg/dL — ABNORMAL HIGH (ref 70–99)
Glucose-Capillary: 257 mg/dL — ABNORMAL HIGH (ref 70–99)
Glucose-Capillary: 131 mg/dL — ABNORMAL HIGH (ref 70–99)
Glucose-Capillary: 97 mg/dL (ref 70–99)
Glucose-Capillary: 200 mg/dL — ABNORMAL HIGH (ref 70–99)

## 2022-07-16 LAB — BASIC METABOLIC PANEL
Anion gap: 6 (ref 5–15)
BUN: 28 mg/dL — ABNORMAL HIGH (ref 8–23)
CO2: 31 mmol/L (ref 22–32)
Calcium: 7.5 mg/dL — ABNORMAL LOW (ref 8.9–10.3)
Chloride: 101 mmol/L (ref 98–111)
Creatinine, Ser: 0.88 mg/dL (ref 0.61–1.24)
GFR, Estimated: >60 mL/min (ref >60)
Glucose, Bld: 222 mg/dL — ABNORMAL HIGH (ref 70–99)
Potassium: 4.3 mmol/L (ref 3.5–5.1)
Sodium: 138 mmol/L (ref 135–145)

## 2022-07-16 LAB — BLOOD GAS, VENOUS
Acid-Base Excess: 7.8 mmol/L — ABNORMAL HIGH (ref 0.0–2.0)
Acid-Base Excess: 8.3 mmol/L — ABNORMAL HIGH (ref 0.0–2.0)
Acid-base deficit: 17 mmol/L — ABNORMAL HIGH (ref 0.0–2.0)
Bicarbonate: 7 mmol/L — ABNORMAL LOW (ref 20.0–28.0)
Bicarbonate: 33.3 mmol/L — ABNORMAL HIGH (ref 20.0–28.0)
Bicarbonate: 34 mmol/L — ABNORMAL HIGH (ref 20.0–28.0)
O2 Saturation: 82.9 %
O2 Saturation: 49.1 %
O2 Saturation: 43.4 %
Patient temperature: 37
Patient temperature: 37
Patient temperature: 37
pCO2, Ven: <18 mmHg — CL (ref 44–60)
pCO2, Ven: 49 mmHg (ref 44–60)
pCO2, Ven: 50 mmHg (ref 44–60)
pH, Ven: 7.28 (ref 7.25–7.43)
pH, Ven: 7.44 — ABNORMAL HIGH (ref 7.25–7.43)
pH, Ven: 7.44 — ABNORMAL HIGH (ref 7.25–7.43)

## 2022-07-16 LAB — LACTIC ACID, PLASMA: Lactic Acid, Venous: 1.4 mmol/L (ref 0.5–1.9)

## 2022-07-16 LAB — PHOSPHORUS: Phosphorus: 3 mg/dL (ref 2.5–4.6)

## 2022-07-16 SURGERY — AMPUTATION, TOE
Anesthesia: General | Site: Toe | Laterality: Left

## 2022-07-16 MED ORDER — ACETAMINOPHEN 10 MG/ML IV SOLN
INTRAVENOUS | Status: AC
  Filled 2022-07-16: qty 100

## 2022-07-16 MED ORDER — SODIUM CHLORIDE 0.9 % IV SOLN: INTRAVENOUS | Status: DC | PRN

## 2022-07-16 MED ORDER — OXYCODONE HCL 5 MG PO TABS: 5.0000 mg | ORAL_TABLET | Freq: Once | ORAL | Status: DC | PRN

## 2022-07-16 MED ORDER — ALBUMIN HUMAN 25 % IV SOLN
25.0000 g | Freq: Once | INTRAVENOUS | Status: AC
  Administered 2022-07-16: 25 g via INTRAVENOUS
  Filled 2022-07-16: qty 100

## 2022-07-16 MED ORDER — LIDOCAINE HCL (PF) 2 % IJ SOLN
INTRAMUSCULAR | Status: AC
  Filled 2022-07-16: qty 5

## 2022-07-16 MED ORDER — OXYCODONE HCL 5 MG/5ML PO SOLN: 5.0000 mg | Freq: Once | ORAL | Status: DC | PRN

## 2022-07-16 MED ORDER — PHENYLEPHRINE HCL (PRESSORS) 10 MG/ML IV SOLN
INTRAVENOUS | Status: DC | PRN
  Administered 2022-07-16: 80 ug via INTRAVENOUS

## 2022-07-16 MED ORDER — PHENYLEPHRINE HCL-NACL 20-0.9 MG/250ML-% IV SOLN
INTRAVENOUS | Status: AC
  Filled 2022-07-16: qty 250

## 2022-07-16 MED ORDER — POVIDONE-IODINE 10 % EX SWAB: 2.0000 | Freq: Once | CUTANEOUS | Status: DC

## 2022-07-16 MED ORDER — ACETAMINOPHEN 10 MG/ML IV SOLN
INTRAVENOUS | Status: DC | PRN
  Administered 2022-07-16: 1000 mg via INTRAVENOUS

## 2022-07-16 MED ORDER — ONDANSETRON HCL 4 MG/2ML IJ SOLN
INTRAMUSCULAR | Status: AC
  Filled 2022-07-16: qty 2

## 2022-07-16 MED ORDER — ONDANSETRON HCL 4 MG/2ML IJ SOLN
INTRAMUSCULAR | Status: DC | PRN
  Administered 2022-07-16: 4 mg via INTRAVENOUS

## 2022-07-16 MED ORDER — FENTANYL CITRATE (PF) 100 MCG/2ML IJ SOLN: 25.0000 ug | INTRAMUSCULAR | Status: DC | PRN

## 2022-07-16 MED ORDER — PROPOFOL 1000 MG/100ML IV EMUL
INTRAVENOUS | Status: AC
  Filled 2022-07-16: qty 100

## 2022-07-16 MED ORDER — HEPARIN (PORCINE) 25000 UT/250ML-% IV SOLN
950.0000 [IU]/h | INTRAVENOUS | Status: AC
  Administered 2022-07-16: 800 [IU]/h via INTRAVENOUS
  Filled 2022-07-16 (×2): qty 250

## 2022-07-16 MED ORDER — CHLORHEXIDINE GLUCONATE 4 % EX LIQD: 60.0000 mL | Freq: Once | CUTANEOUS | Status: DC

## 2022-07-16 MED ORDER — LIDOCAINE HCL (CARDIAC) PF 100 MG/5ML IV SOSY
PREFILLED_SYRINGE | INTRAVENOUS | Status: DC | PRN
  Administered 2022-07-16: 30 mg via INTRAVENOUS

## 2022-07-16 MED ORDER — 0.9 % SODIUM CHLORIDE (POUR BTL) OPTIME
TOPICAL | Status: DC | PRN
  Administered 2022-07-16: 1000 mL

## 2022-07-16 MED ORDER — BUPIVACAINE HCL 0.5 % IJ SOLN
INTRAMUSCULAR | Status: DC | PRN
  Administered 2022-07-16: 10 mL

## 2022-07-16 MED ORDER — PROPOFOL 500 MG/50ML IV EMUL
INTRAVENOUS | Status: DC | PRN
  Administered 2022-07-16: 20 mg via INTRAVENOUS
  Administered 2022-07-16: 50 ug/kg/min via INTRAVENOUS

## 2022-07-16 MED ORDER — SURGIFLO WITH THROMBIN (HEMOSTATIC MATRIX KIT) OPTIME
TOPICAL | Status: DC | PRN
  Administered 2022-07-16: 1 via TOPICAL

## 2022-07-16 MED ORDER — SODIUM CHLORIDE 0.9 % IV SOLN: INTRAVENOUS | Status: DC

## 2022-07-16 MED ORDER — SODIUM BICARBONATE 8.4 % IV SOLN
50.0000 meq | Freq: Once | INTRAVENOUS | Status: AC
  Administered 2022-07-16: 50 meq via INTRAVENOUS
  Filled 2022-07-16: qty 50

## 2022-07-16 MED ORDER — LIDOCAINE HCL (PF) 1 % IJ SOLN
INTRAMUSCULAR | Status: DC | PRN
  Administered 2022-07-16: 10 mL

## 2022-07-16 SURGICAL SUPPLY — 49 items
BLADE OSC/SAGITTAL MD 5.5X18 (BLADE) ×1 IMPLANT
BLADE SURG MINI STRL (BLADE) ×1 IMPLANT
BNDG ELASTIC 4X5.8 VLCR NS LF (GAUZE/BANDAGES/DRESSINGS) ×1 IMPLANT
BNDG ELASTIC 4X5.8 VLCR STR LF (GAUZE/BANDAGES/DRESSINGS) IMPLANT
BNDG ESMARCH 4 X 12 STRL LF (GAUZE/BANDAGES/DRESSINGS) ×1
BNDG ESMARCH 4X12 STRL LF (GAUZE/BANDAGES/DRESSINGS) ×1 IMPLANT
BNDG GAUZE DERMACEA FLUFF 4 (GAUZE/BANDAGES/DRESSINGS) ×1 IMPLANT
BNDG STRETCH 4X75 STRL LF (GAUZE/BANDAGES/DRESSINGS) IMPLANT
BNDG STRETCH GAUZE 3IN X12FT (GAUZE/BANDAGES/DRESSINGS) ×2 IMPLANT
CUFF TOURN SGL QUICK 12 (TOURNIQUET CUFF) IMPLANT
CUFF TOURN SGL QUICK 18X4 (TOURNIQUET CUFF) IMPLANT
DRAPE FLUOR MINI C-ARM 54X84 (DRAPES) ×1 IMPLANT
DRAPE XRAY CASSETTE 23X24 (DRAPES) ×1 IMPLANT
DRSG XEROFORM 1X8 (GAUZE/BANDAGES/DRESSINGS) IMPLANT
DURAPREP 26ML APPLICATOR (WOUND CARE) ×1 IMPLANT
ELECT REM PT RETURN 9FT ADLT (ELECTROSURGICAL) ×1
ELECTRODE REM PT RTRN 9FT ADLT (ELECTROSURGICAL) ×1 IMPLANT
GAUZE PACKING IODOFORM 1/2INX (GAUZE/BANDAGES/DRESSINGS) ×1 IMPLANT
GAUZE SPONGE 4X4 12PLY STRL (GAUZE/BANDAGES/DRESSINGS) ×1 IMPLANT
GAUZE STRETCH 2X75IN STRL (MISCELLANEOUS) ×1 IMPLANT
GAUZE XEROFORM 1X8 LF (GAUZE/BANDAGES/DRESSINGS) ×1 IMPLANT
GLOVE BIO SURGEON STRL SZ7.5 (GLOVE) ×1 IMPLANT
GLOVE SURG UNDER LTX SZ8 (GLOVE) ×1 IMPLANT
GOWN STRL REUS W/ TWL XL LVL3 (GOWN DISPOSABLE) ×2 IMPLANT
GOWN STRL REUS W/TWL XL LVL3 (GOWN DISPOSABLE) ×2
IV NS IRRIG 3000ML ARTHROMATIC (IV SOLUTION) ×1 IMPLANT
KIT TURNOVER KIT A (KITS) ×1 IMPLANT
LABEL OR SOLS (LABEL) ×1 IMPLANT
MANIFOLD NEPTUNE II (INSTRUMENTS) ×1 IMPLANT
NDL FILTER BLUNT 18X1 1/2 (NEEDLE) ×1 IMPLANT
NDL HYPO 25X1 1.5 SAFETY (NEEDLE) ×1 IMPLANT
NEEDLE FILTER BLUNT 18X1 1/2 (NEEDLE) ×1 IMPLANT
NEEDLE HYPO 25X1 1.5 SAFETY (NEEDLE) ×1 IMPLANT
NS IRRIG 500ML POUR BTL (IV SOLUTION) ×1 IMPLANT
PACK EXTREMITY ARMC (MISCELLANEOUS) ×1 IMPLANT
PAD ABD DERMACEA PRESS 5X9 (GAUZE/BANDAGES/DRESSINGS) ×2 IMPLANT
PULSAVAC PLUS IRRIG FAN TIP (DISPOSABLE) ×1
SHIELD FULL FACE ANTIFOG 7M (MISCELLANEOUS) ×1 IMPLANT
STOCKINETTE M/LG 89821 (MISCELLANEOUS) ×1 IMPLANT
STRAP SAFETY 5IN WIDE (MISCELLANEOUS) ×1 IMPLANT
SURGIFLO W/THROMBIN 8M KIT (HEMOSTASIS) IMPLANT
SUT ETHILON 3-0 FS-10 30 BLK (SUTURE) ×1
SUT ETHILON 5-0 FS-2 18 BLK (SUTURE) ×1 IMPLANT
SUT VIC AB 4-0 FS2 27 (SUTURE) ×1 IMPLANT
SUTURE EHLN 3-0 FS-10 30 BLK (SUTURE) ×1 IMPLANT
SYR 10ML LL (SYRINGE) ×3 IMPLANT
TIP FAN IRRIG PULSAVAC PLUS (DISPOSABLE) ×1 IMPLANT
TRAP FLUID SMOKE EVACUATOR (MISCELLANEOUS) ×1 IMPLANT
WATER STERILE IRR 500ML POUR (IV SOLUTION) ×1 IMPLANT

## 2022-07-16 NOTE — Anesthesia Preprocedure Evaluation (Deleted)
Anesthesia Evaluation    Airway        Dental   Pulmonary           Cardiovascular hypertension,      Neuro/Psych    GI/Hepatic   Endo/Other  diabetes    Renal/GU      Musculoskeletal   Abdominal   Peds  Hematology   Anesthesia Other Findings   Reproductive/Obstetrics                              Anesthesia Physical Anesthesia Plan Anesthesia Quick Evaluation

## 2022-07-16 NOTE — Anesthesia Preprocedure Evaluation (Signed)
Anesthesia Evaluation  Patient identified by MRN, date of birth, ID band Patient awake    Reviewed: Allergy & Precautions, NPO status , Patient's Chart, lab work & pertinent test results  History of Anesthesia Complications Negative for: history of anesthetic complications  Airway Mallampati: III  TM Distance: >3 FB Neck ROM: full    Dental  (+) Edentulous Lower, Edentulous Upper   Pulmonary neg pulmonary ROS   Pulmonary exam normal        Cardiovascular hypertension, + Peripheral Vascular Disease and +CHF  Normal cardiovascular exam+ dysrhythmias + pacemaker      Neuro/Psych negative neurological ROS  negative psych ROS   GI/Hepatic negative GI ROS, Neg liver ROS,,,  Endo/Other  negative endocrine ROSdiabetes, Type 2, Insulin Dependent    Renal/GU Renal disease  negative genitourinary   Musculoskeletal   Abdominal   Peds  Hematology negative hematology ROS (+)   Anesthesia Other Findings Past Medical History: No date: (HFpEF) heart failure with preserved ejection fraction (HCC) No date: Arthritis No date: Complete heart block ([place])     Comment:  a.) s/p dual chamber Medtronic PPM placement 01/31/2016 No date: Frequent PVCs No date: Hemorrhoids     Comment:  a.) s/p banding 01/2021 No date: HLD (hyperlipidemia) No date: Hypertension No date: Long term current use of antithrombotics/antiplatelets     Comment:  a.) on DAPT (ASA + clopidogrel) No date: Peripheral vascular disease ([place]) 01/31/2016: Presence of permanent cardiac pacemaker     Comment:  a.) s/p Advisa DR MRI SureScan dual chamber PPM (SN:               [id number]) No date: SDH (subdural hematoma) (HCC) No date: Skin cancer No date: Type 2 diabetes mellitus treated with insulin ([place])  Past Surgical History: 06/30/2022: AMPUTATION TOE; Left     Comment:  Procedure: AMPUTATION TOE - [place];  Surgeon:  Sharlotte Alamo, DPM;  Location: [facility] ORS;                Service: Podiatry;  Laterality: Left; No date: EYE SURGERY No date: FEMORAL ENDARTERECTOMY; Bilateral 01/2021: HEMORRHOID BANDING; N/A No date: HERNIA REPAIR     Comment:  double hernia 06/12/2022: LOWER EXTREMITY ANGIOGRAPHY; Left     Comment:  Procedure: Lower Extremity Angiography;  Surgeon: Algernon Huxley, MD;  Location: [facility];  Service:               Cardiovascular;  Laterality: Left; 07/13/2022: LOWER EXTREMITY ANGIOGRAPHY; Left     Comment:  Procedure: Lower Extremity Angiography;  Surgeon: Algernon Huxley, MD;  Location: [facility];  Service:               Cardiovascular;  Laterality: Left; 06/14/2022: LOWER EXTREMITY INTERVENTION; Bilateral     Comment:  Procedure: LOWER EXTREMITY INTERVENTION;  Surgeon: Algernon Huxley, MD;  Location: [facility];  Service:               Cardiovascular;  Laterality: Bilateral; 01/31/2016: PACEMAKER INSERTION; Left     Comment:  Procedure: PACEMAKER INSERTION; Location: [facility]; Surgeon:  Dimas Alexandria, MD  BMI    Body Mass Index: 22.28 kg/m      Reproductive/Obstetrics negative OB ROS                             Anesthesia Physical Anesthesia Plan  ASA: 3  Anesthesia Plan: General   Post-op Pain Management: Ofirmev IV (intra-op)*   Induction: Intravenous  PONV Risk Score and Plan: Propofol infusion and TIVA  Airway Management Planned: Natural Airway and Nasal Cannula  Additional Equipment:   Intra-op Plan:   Post-operative Plan:   Informed Consent: I have reviewed the patients History and Physical, chart, labs and discussed the procedure including the risks, benefits and alternatives for the proposed anesthesia with the patient or authorized representative who has indicated his/her understanding and acceptance.     Dental Advisory Given  Plan Discussed with:  Anesthesiologist, CRNA and Surgeon  Anesthesia Plan Comments: (Patient consented for risks of anesthesia including but not limited to:  - adverse reactions to medications - risk of airway placement if required - damage to eyes, teeth, lips or other oral mucosa - nerve damage due to positioning  - sore throat or hoarseness - Damage to heart, brain, nerves, lungs, other parts of body or loss of life  Patient voiced understanding.)        Anesthesia Quick Evaluation

## 2022-07-16 NOTE — Consult Note (Signed)
ANTICOAGULATION CONSULT NOTE  Pharmacy Consult for Heparin Indication:  lower limb ischemia   No Known Allergies  Patient Measurements: Height: 5' 7.5" (171.5 cm) Weight: 65.5 kg (144 lb 6.4 oz) IBW/kg (Calculated) : 67.25 Heparin Dosing Weight: 65.5 kg  Vital Signs: Temp: 97.4 F (36.3 C) (02/11 1320) Temp Source: Oral (02/11 1320) BP: 160/81 (02/11 2200) Pulse Rate: 89 (02/11 2200)  Labs: Recent Labs    07/14/22 0510 07/15/22 0542 07/15/22 1938 07/15/22 2135 07/15/22 2136 07/15/22 2222 07/16/22 0502 07/16/22 0511 07/16/22 2200  HGB 13.4  --   --   --  12.2*  --   --   --   --   HCT 40.9  --   --   --  36.4*  --   --   --   --   PLT 152  --   --   --  148*  --   --   --   --   HEPARINUNFRC  --   --    < >  --   --  0.25* 0.36  --  0.25*  CREATININE 0.98 0.99  --  0.98  --   --   --  0.88  --    < > = values in this interval not displayed.     Estimated Creatinine Clearance: 49.6 mL/min (by C-G formula based on SCr of 0.88 mg/dL).   Medical History: Past Medical History:  Diagnosis Date   (HFpEF) heart failure with preserved ejection fraction (HCC)    Arthritis    Complete heart block ([place])    a.) s/p dual chamber Medtronic PPM placement 01/31/2016   Frequent PVCs    Hemorrhoids    a.) s/p banding 01/2021   HLD (hyperlipidemia)    Hypertension    Long term current use of antithrombotics/antiplatelets    a.) on DAPT (ASA + clopidogrel)   Peripheral vascular disease ([place])    Presence of permanent cardiac pacemaker 01/31/2016   a.) s/p Advisa DR MRI SureScan dual chamber PPM (SN: [id number])   SDH (subdural hematoma) (HCC)    Skin cancer    Type 2 diabetes mellitus treated with insulin (HCC)     Medications:  Medications Prior to Admission  Medication Sig Dispense Refill Last Dose   atorvastatin (LIPITOR) 40 MG tablet Take 1 tablet (40 mg total) by mouth every evening. 30 tablet 0 07/11/2022   clopidogrel (PLAVIX) 75 MG tablet Take 1 tablet (75 mg  total) by mouth daily. 60 tablet 0 07/12/2022   diltiazem (CARDIZEM CD) 240 MG 24 hr capsule Take 240 mg by mouth daily.   07/12/2022   gabapentin (NEURONTIN) 300 MG capsule Take 300 mg by mouth daily.   07/12/2022   JARDIANCE 10 MG TABS tablet Take 10 mg by mouth daily.   07/12/2022   lisinopril (ZESTRIL) 40 MG tablet Take 40 mg by mouth daily.   07/12/2022   loratadine (CLARITIN) 10 MG tablet Take 1 tablet by mouth daily.   07/12/2022   metFORMIN (GLUCOPHAGE) 500 MG tablet Take 500 mg by mouth 2 (two) times daily with a meal.   07/12/2022   NOVOLIN 70/30 (70-30) 100 UNIT/ML injection Inject 10 Units into the skin daily with breakfast. (Patient taking differently: Inject into the skin daily with breakfast. Inject subcutaneously once daily. Sliding scale) 10 mL 11 07/12/2022   Scheduled:   atorvastatin  40 mg Oral QPM   Chlorhexidine Gluconate Cloth  6 each Topical Daily   diltiazem  240  mg Oral Daily   gabapentin  300 mg Oral Daily   insulin aspart  0-5 Units Subcutaneous QHS   insulin aspart  0-9 Units Subcutaneous TID WC   lisinopril  40 mg Oral Daily   Infusions:   sodium chloride 125 mL/hr at 07/16/22 2000   sodium chloride 10 mL/hr at 07/16/22 2000   ceFEPime (MAXIPIME) IV 2 g (07/16/22 2203)   heparin 800 Units/hr (07/16/22 2000)   metronidazole Stopped (07/16/22 1453)   vancomycin Stopped (07/16/22 1712)   PRN: sodium chloride, acetaminophen **OR** acetaminophen, HYDROmorphone (DILAUDID) injection, morphine injection, ondansetron **OR** ondansetron (ZOFRAN) IV, ondansetron (ZOFRAN) IV, senna-docusate Anti-infectives (From admission, onward)    Start     Dose/Rate Route Frequency Ordered Stop   07/16/22 1400  vancomycin (VANCOCIN) IVPB 1000 mg/200 mL premix        1,000 mg 200 mL/hr over 60 Minutes Intravenous Every 24 hours 07/15/22 1813     07/13/22 1603  ceFAZolin (ANCEF) IVPB 2g/100 mL premix        2 g 200 mL/hr over 30 Minutes Intravenous 30 min pre-op 07/13/22 1603 07/13/22 1718    07/13/22 1400  vancomycin (VANCOREADY) IVPB 750 mg/150 mL  Status:  Discontinued        750 mg 150 mL/hr over 60 Minutes Intravenous Every 24 hours 07/12/22 1328 07/15/22 1813   07/12/22 2200  ceFEPIme (MAXIPIME) 2 g in sodium chloride 0.9 % 100 mL IVPB        2 g 200 mL/hr over 30 Minutes Intravenous Every 12 hours 07/12/22 1328     07/12/22 1215  vancomycin (VANCOREADY) IVPB 1500 mg/300 mL        1,500 mg 150 mL/hr over 120 Minutes Intravenous  Once 07/12/22 1207 07/12/22 1548   07/12/22 1215  ceFEPIme (MAXIPIME) 2 g in sodium chloride 0.9 % 100 mL IVPB        2 g 200 mL/hr over 30 Minutes Intravenous  Once 07/12/22 1207 07/12/22 1315   07/12/22 1200  metroNIDAZOLE (FLAGYL) IVPB 500 mg        500 mg 100 mL/hr over 60 Minutes Intravenous Every 12 hours 07/12/22 1159 07/19/22 1159       Assessment: 89 yo M with a PMH diabetes, lower limb ischemia amputation sent in for evaluation by podiatrist. Patient underwent two lower extremity angiography (2/8 and 2/9) and digital amputation on 2/11. Pharmacy has been advised to restart heparin with no bolus due to minor bleeding during procedure. Plan at this point per Dr. Trula Slade is to send pt home on ASA and apixaban. Heparin restarted 2/11 @ 1400.  Baseline Labs: aPTT - 29; INR - 1.1 Hgb - 17.1; Plts - 211  Goal of Therapy:  Heparin level 0.3-0.7 units/ml Monitor platelets by anticoagulation protocol: Yes  Date Time HL Rate/Comment 2/10 1938 0.37 Therapeutic x1 / 800 un/hr 2/11 0502 0.36 Therapeutic x 2 2/11 [id number]  Stopped for surgery 2/11 1400  Heparin drip restarted 2/11 2200 0.25 Subtherapeutic  Plan:  Increase heparin infusion to 950 un/hr (no bolus due to minor bleeding during surgery) Recheck HL w/ AM labs after rate change CBC daily while on heparin  Renda Rolls, PharmD, [facility] 07/16/2022 10:57 PM

## 2022-07-16 NOTE — Transfer of Care (Signed)
Immediate Anesthesia Transfer of Care Note  Patient: [name]  Procedure(s) Performed: AMPUTATION TOE (Left: Toe)  Patient Location: PACU  Anesthesia Type:MAC  Level of Consciousness: awake, alert , and patient cooperative  Airway & Oxygen Therapy: Patient Spontanous Breathing and Patient connected to nasal cannula oxygen  Post-op Assessment: Report given to RN and Patient moving all extremities X 4  Post vital signs: stable  Last Vitals:  Vitals Value Taken Time  BP    Temp    Pulse 61 07/16/22 1215  Resp 12 07/16/22 1215  SpO2 97 % 07/16/22 1215  Vitals shown include unvalidated device data.  Last Pain:  Vitals:   07/16/22 0800  TempSrc: Axillary  PainSc: 0-No pain         Complications: No notable events documented.

## 2022-07-16 NOTE — Consult Note (Addendum)
ANTICOAGULATION CONSULT NOTE  Pharmacy Consult for Heparin Indication:  lower limb ischemia   No Known Allergies  Patient Measurements: Height: 5' 7.5" (171.5 cm) Weight: 65.5 kg (144 lb 6.4 oz) IBW/kg (Calculated) : 67.25 Heparin Dosing Weight: 65.5 kg  Vital Signs: Temp: 97.2 F (36.2 C) (02/11 1214) Temp Source: Axillary (02/11 0800) BP: 117/59 (02/11 1230) Pulse Rate: 59 (02/11 1230)  Labs: Recent Labs    07/13/22 1822 07/13/22 1822 07/14/22 0510 07/15/22 0542 07/15/22 1938 07/15/22 2135 07/15/22 2136 07/15/22 2222 07/16/22 0502 07/16/22 0511  HGB 13.9  --  13.4  --   --   --  12.2*  --   --   --   HCT 42.1  --  40.9  --   --   --  36.4*  --   --   --   PLT 188  --  152  --   --   --  148*  --   --   --   HEPARINUNFRC  --   --   --   --  0.37  --   --  0.25* 0.36  --   CREATININE  --    < > 0.98 0.99  --  0.98  --   --   --  0.88   < > = values in this interval not displayed.     Estimated Creatinine Clearance: 49.6 mL/min (by C-G formula based on SCr of 0.88 mg/dL).   Medical History: Past Medical History:  Diagnosis Date   (HFpEF) heart failure with preserved ejection fraction (HCC)    Arthritis    Complete heart block ([place])    a.) s/p dual chamber Medtronic PPM placement 01/31/2016   Frequent PVCs    Hemorrhoids    a.) s/p banding 01/2021   HLD (hyperlipidemia)    Hypertension    Long term current use of antithrombotics/antiplatelets    a.) on DAPT (ASA + clopidogrel)   Peripheral vascular disease ([place])    Presence of permanent cardiac pacemaker 01/31/2016   a.) s/p Advisa DR MRI SureScan dual chamber PPM (SN: [id number])   SDH (subdural hematoma) (HCC)    Skin cancer    Type 2 diabetes mellitus treated with insulin (HCC)     Medications:  Medications Prior to Admission  Medication Sig Dispense Refill Last Dose   atorvastatin (LIPITOR) 40 MG tablet Take 1 tablet (40 mg total) by mouth every evening. 30 tablet 0 07/11/2022   clopidogrel  (PLAVIX) 75 MG tablet Take 1 tablet (75 mg total) by mouth daily. 60 tablet 0 07/12/2022   diltiazem (CARDIZEM CD) 240 MG 24 hr capsule Take 240 mg by mouth daily.   07/12/2022   gabapentin (NEURONTIN) 300 MG capsule Take 300 mg by mouth daily.   07/12/2022   JARDIANCE 10 MG TABS tablet Take 10 mg by mouth daily.   07/12/2022   lisinopril (ZESTRIL) 40 MG tablet Take 40 mg by mouth daily.   07/12/2022   loratadine (CLARITIN) 10 MG tablet Take 1 tablet by mouth daily.   07/12/2022   metFORMIN (GLUCOPHAGE) 500 MG tablet Take 500 mg by mouth 2 (two) times daily with a meal.   07/12/2022   NOVOLIN 70/30 (70-30) 100 UNIT/ML injection Inject 10 Units into the skin daily with breakfast. (Patient taking differently: Inject into the skin daily with breakfast. Inject subcutaneously once daily. Sliding scale) 10 mL 11 07/12/2022   Scheduled:   [MAR Hold] atorvastatin  40 mg Oral QPM  chlorhexidine  60 mL Topical Once   [MAR Hold] Chlorhexidine Gluconate Cloth  6 each Topical Daily   [MAR Hold] diltiazem  240 mg Oral Daily   [MAR Hold] gabapentin  300 mg Oral Daily   [MAR Hold] insulin aspart  0-5 Units Subcutaneous QHS   [MAR Hold] insulin aspart  0-9 Units Subcutaneous TID WC   [MAR Hold] lisinopril  40 mg Oral Daily   povidone-iodine  2 Application Topical Once   Infusions:   sodium chloride 125 mL/hr at 07/16/22 1000   [MAR Hold] ceFEPime (MAXIPIME) IV 2 g (07/16/22 1053)   heparin     [MAR Hold] metronidazole Stopped (07/16/22 0042)   [MAR Hold] vancomycin     PRN: [id number] Hold] acetaminophen **OR** [MAR Hold] acetaminophen, fentaNYL (SUBLIMAZE) injection, [MAR Hold] hydrALAZINE, [MAR Hold]  HYDROmorphone (DILAUDID) injection, [MAR Hold]  morphine injection, [MAR Hold] ondansetron **OR** [MAR Hold] ondansetron (ZOFRAN) IV, [MAR Hold] ondansetron (ZOFRAN) IV, oxyCODONE **OR** oxyCODONE, [MAR Hold] senna-docusate Anti-infectives (From admission, onward)    Start     Dose/Rate Route Frequency Ordered Stop    07/16/22 1400  [MAR Hold]  vancomycin (VANCOCIN) IVPB 1000 mg/200 mL premix        (MAR Hold since Sun 07/16/2022 at 1122.Hold Reason: Transfer to a Procedural area)   1,000 mg 200 mL/hr over 60 Minutes Intravenous Every 24 hours 07/15/22 1813     07/13/22 1603  ceFAZolin (ANCEF) IVPB 2g/100 mL premix        2 g 200 mL/hr over 30 Minutes Intravenous 30 min pre-op 07/13/22 1603 07/13/22 1718   07/13/22 1400  vancomycin (VANCOREADY) IVPB 750 mg/150 mL  Status:  Discontinued        750 mg 150 mL/hr over 60 Minutes Intravenous Every 24 hours 07/12/22 1328 07/15/22 1813   07/12/22 2200  [MAR Hold]  ceFEPIme (MAXIPIME) 2 g in sodium chloride 0.9 % 100 mL IVPB        (MAR Hold since Sun 07/16/2022 at 1122.Hold Reason: Transfer to a Procedural area)   2 g 200 mL/hr over 30 Minutes Intravenous Every 12 hours 07/12/22 1328     07/12/22 1215  vancomycin (VANCOREADY) IVPB 1500 mg/300 mL        1,500 mg 150 mL/hr over 120 Minutes Intravenous  Once 07/12/22 1207 07/12/22 1548   07/12/22 1215  ceFEPIme (MAXIPIME) 2 g in sodium chloride 0.9 % 100 mL IVPB        2 g 200 mL/hr over 30 Minutes Intravenous  Once 07/12/22 1207 07/12/22 1315   07/12/22 1200  [MAR Hold]  metroNIDAZOLE (FLAGYL) IVPB 500 mg        (MAR Hold since Sun 07/16/2022 at 1122.Hold Reason: Transfer to a Procedural area)   500 mg 100 mL/hr over 60 Minutes Intravenous Every 12 hours 07/12/22 1159 07/19/22 1159       Assessment: 89 yo M with a PMH diabetes, lower limb ischemia amputation sent in for evaluation by podiatrist. Patient underwent two lower extremity angiography (2/8 and 2/9) and digital amputation on 2/11. Pharmacy has been advised to restart heparin with no bolus due to minor bleeding during procedure. Plan at this point per Dr. Trula Slade is to send pt home on ASA and apixaban. Heparin restarted 2/11 @ 1400.  Baseline Labs: aPTT - 29; INR - 1.1 Hgb - 17.1; Plts - 211  Goal of Therapy:  Heparin level 0.3-0.7 units/ml Monitor  platelets by anticoagulation protocol: Yes  Date Time HL Rate/Comment 2/10 1938 0.37  Therapeutic x1 / 800 un/hr 2/11 0502 0.36 Therapeutic x 2 2/11 0629  Stopped for surgery 2/11 1400  Heparin drip restarted  Plan:  Resume heparin infusion at 800 un/hr (no bolus due to minor bleeding during surgery) Check anti-Xa level in 8 hours and daily once consecutively therapeutic. Continue to monitor H&H and platelets daily while on heparin gtt.  Will M. Ouida Sills, PharmD PGY-1 Pharmacy Resident 07/16/2022 12:53 PM

## 2022-07-16 NOTE — Progress Notes (Signed)
[facility] Progress Note  Patient: Charles Jennings    [id number]  DOA: 07/12/2022     Date of Service: the patient was seen and examined on 07/16/2022  Chief Complaint  Patient presents with   Foot Problem   Brief hospital course: 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   Initial vitals in the ED showed temperature of 97.6, respiration rate of 18, heart rate 76, blood pressure 165/67, SpO2 of 96% on room air.   Serum sodium is 131, potassium 4.5, chloride 90, bicarb 22, BUN of 53, serum creatinine 1.36, nonfasting blood glucose 242, EGFR 49, WBC 16.8, hemoglobin 17.1, platelets of 211.   Anion gap was elevated at 19.  Alk phos elevated at 212.   ED treatment: Cefepime, vancomycin, metronidazole.  He reports he saw Dr. Cleda Mccreedy, and was advised to come to the ED. He reports the left foot has been bothering him for over one year. He states this was a regular scheduled visit.    Assessment and Plan: Principal Problem:   Osteomyelitis ([place]) Active Problems:   Limb ischemia   Insulin dependent type 2 diabetes mellitus (HCC)   Mixed hyperlipidemia   Benign essential HTN   Stage 3a chronic kidney disease (CKD) (HCC)   Weakness   LLE peripheral artery disease Vascular surgery consulted, On 2/8 s/p angiogram, mechanical thrombectomy of left SFA pop with popliteal artery and tibial peroneal trunk. S/p tPA at the time of procedure, patient was started on continuous tPA infusion overnight, right femoral triple-lumen catheter was placed. On 2/9  Left lower extremity angiogram, Mechanical thrombectomy of the left SFA, popliteal artery, tibioperoneal trunk, and proximal posterior tibial artery with the penumbra CAT 6 device. Stent placement to  the left distal SFA and above-knee popliteal artery with 6 mm diameter by 15 cm length Viabahn stent for residual thrombus and stenosis after thrombectomy. StarClose closure device right femoral artery Patient is on heparin IV infusion Follow vascular surgery for further recommendation  # Osteomyelitis of Left third toe With possible cellulitis at the site of left foot amputation Continue vancomycin, cefepime and Flagyl Blood cultures x 2 NGTD S/p IVF for hydration Patient is n.p.o. after midnight for left amputation by podiatry as scheduled today   Insulin dependent type 2 diabetes mellitus ([place]) - Home NovoLog 70/30, 10 units daily before breakfast not resumed on admission - Jardiance 10 mg daily, metformin 500 mg p.o. twice daily were not resumed on admission - Insulin SSI with at bedtime coverage ordered - Goal inpatient blood glucose levels 140-180   Mixed hyperlipidemia - Atorvastatin 40 mg daily   Benign essential HTN - Diltiazem 240 mg daily, lisinopril 40 mg daily resumed - Hydralazine 5 mg IV every 8 hours as needed for SBP greater than 175, 4 days ordered   Weakness Patient need on the floor for 1 day due to extreme weakness, per daughter - CK 66 wnl, B12 level 563 wnl, TSH 3.17 wnl - PT, OT ordered   Stage 3a chronic kidney disease (CKD) (HCC) - Serum creatinine elevation, does not meet criteria for acute kidney injury, presumed secondary to poor p.o. intake Serum creatinine ranges 1.03-1.29-->0.88 S/p IVF  - Check BMP in a.m.  Isotonic hyponatremia most likely due to nutritional deficiency Serum osmolality 289, Sodium 132--138 Resolved   Hypomagnesemia, mag repleted. Monitor  and replete as needed.  Hypophosphatemia, Phos repleted.  Resolved monitor and replete as needed.  Body mass index is 21.58 kg/m.  Interventions:       Diet: NPO for now, resume carb modified diet after the procedure DVT Prophylaxis: Subcutaneous Heparin    Advance goals of care  discussion: Full code  Family Communication: family was not present at bedside, at the time of interview.  The pt provided permission to discuss medical plan with the family. Opportunity was given to ask question and all questions were answered satisfactorily.   Disposition:  Pt is from Home, admitted with left toe osteomyelitis, vascular surgery and podiatry consulted, s/p LLE angiogram, thrombectomy.  Podiatry following for to amputation which is scheduled today, which precludes a safe discharge. Discharge to SNF TBD after PT/OT eval, when medically stable and cleared by podiatry and vascular surgery..  Subjective: No significant events overnight, left foot pain 2/10, stated that it is well-controlled now, denies any worsening of shortness of breath, no chest pain or palpitation, no any other active issues.   Physical Exam: General: NAD, lying comfortably Appear in no distress, affect appropriate Eyes: PERRLA ENT: Oral Mucosa Clear, moist  Neck: no JVD,  Cardiovascular: S1 and S2 Present, no Murmur,  Respiratory: good respiratory effort, Bilateral Air entry equal and Decreased, no Crackles, no wheezes Abdomen: Bowel Sound present, Soft and no tenderness,  Skin: no rashes Extremities: no Pedal edema, no calf tenderness.  Left second toe blackish discolored, dressing CDI.  Neurologic: without any new focal findings Gait not checked due to patient safety concerns  Vitals:   07/16/22 1230 07/16/22 1245 07/16/22 1320 07/16/22 1400  BP: (!) 117/59 (!) 142/58  130/87  Pulse: (!) 59 60  60  Resp: 11 11  10  $ Temp:  (!) 97.3 F (36.3 C) (!) 97.4 F (36.3 C)   TempSrc:   Oral   SpO2: 96% 100%  100%  Weight:      Height:        Intake/Output Summary (Last 24 hours) at 07/16/2022 1511 Last data filed at 07/16/2022 1431 Gross per 24 hour  Intake 4483.92 ml  Output 803 ml  Net 3680.92 ml   Filed Weights   07/12/22 1104 07/13/22 1930 07/15/22 0000  Weight: 62.5 kg 55 kg 65.5 kg     Data Reviewed: I have personally reviewed and interpreted daily labs, tele strips, imagings as discussed above. I reviewed all nursing notes, pharmacy notes, vitals, pertinent old records I have discussed plan of care as described above with RN and patient/family.  CBC: Recent Labs  Lab 07/12/22 1130 07/13/22 0504 07/13/22 1822 07/14/22 0510 07/15/22 2136  WBC 16.8* 11.1* 12.7* 10.5 14.1*  NEUTROABS 13.8*  --   --   --  10.5*  HGB 17.1* 14.4 13.9 13.4 12.2*  HCT 51.7 42.8 42.1 40.9 36.4*  MCV 87.9 87.2 87.9 89.1 87.7  PLT 211 166 188 152 [id number]*   Basic Metabolic Panel: Recent Labs  Lab 07/13/22 0504 07/13/22 1010 07/14/22 0510 07/15/22 0542 07/15/22 2135 07/15/22 2136 07/16/22 0511  NA 132*  --  135 138 133*  --  138  K 5.1  --  3.7 4.3 3.4*  --  4.3  CL 100  --  105 105 106  --  101  CO2 25  --  21* 20* 22  --  31  GLUCOSE 139*  --  146* 177* 133*  --  222*  BUN 40*  --  30* 28* 31*  --  28*  CREATININE 0.97  --  0.98 0.99 0.98  --  0.88  CALCIUM 8.2*  --  7.7* 8.1* 7.5*  --  7.5*  MG  --  1.7 1.6* 2.0  --  1.9  --   PHOS  --  2.3* 3.0 3.2  --  1.5* 3.0    Studies: No results found.  Scheduled Meds:  atorvastatin  40 mg Oral QPM   Chlorhexidine Gluconate Cloth  6 each Topical Daily   diltiazem  240 mg Oral Daily   gabapentin  300 mg Oral Daily   insulin aspart  0-5 Units Subcutaneous QHS   insulin aspart  0-9 Units Subcutaneous TID WC   lisinopril  40 mg Oral Daily   Continuous Infusions:  sodium chloride 125 mL/hr at 07/16/22 1431   sodium chloride Stopped (07/16/22 1353)   ceFEPime (MAXIPIME) IV 200 mL/hr at 07/16/22 1100   heparin 800 Units/hr (07/16/22 1431)   metronidazole 100 mL/hr at 07/16/22 1431   vancomycin     PRN Meds: sodium chloride, acetaminophen **OR** acetaminophen, HYDROmorphone (DILAUDID) injection, morphine injection, ondansetron **OR** ondansetron (ZOFRAN) IV, ondansetron (ZOFRAN) IV, senna-docusate  Time spent: 35  minutes  Author: Val Riles. MD [facility] 07/16/2022 3:11 PM  To reach On-call, see care teams to locate the attending and reach out to them via [email or web address]. If 7PM-7AM, please contact night-coverage If you still have difficulty reaching the attending provider, please page the [facility] (Director on Call) for [facility] on amion for assistance.

## 2022-07-16 NOTE — Progress Notes (Addendum)
Patient underwent amputation left second toe with I&D of dorsal left foot.  Intraoperatively large abscess was noted on the dorsal foot with a large area of necrotic tissue.  Excision of this material and the area was flushed.  Dorsal wound was left open and covered with nonadherent bandage.  Amputation site was closed distally.  Consult for pharmacy to restart heparin and other anticoagulants as recommended by vascular surgery.  Podiatry will continue to follow.  Recommend nonweightbearing to this left foot now.  Keep foot elevated.  Will have to monitor for healing to the region.  Patient at high risk of further tissue or possible limb loss.

## 2022-07-16 NOTE — Progress Notes (Signed)
       CROSS COVER NOTE  NAME: Charles Jennings MRN: [id number] DOB : 1927-11-02    HPI/Events of Note   Patient with sudden onset hypotension approx  2100  yesterday evening   Assessment and  Interventions   Assessment: Critical bicarb level of 7 on initial VBG Lactic acid 3.2,  WBC 14.1 with neutrophilia Plan: Serial vbg and chem panels during night - bicarb deficit corrected Phos = 1.5 supplemented  Bc x2 sent Hypotension resolved with bicarb dose of midodrine and one dose of 25 gm albumin Hold antihypertensives       Kathlene Cote NP [facility]

## 2022-07-16 NOTE — Progress Notes (Signed)
Pt transported to PACU via bed accompanied by this RN and OR/PACU NT, on telemetry.

## 2022-07-16 NOTE — Consult Note (Signed)
ANTICOAGULATION CONSULT NOTE  Pharmacy Consult for Heparin Indication:  lower limb ischemia   No Known Allergies  Patient Measurements: Height: 5' 7.5" (171.5 cm) Weight: 65.5 kg (144 lb 6.4 oz) IBW/kg (Calculated) : 67.25 Heparin Dosing Weight: 65.5 kg  Vital Signs: Temp: 97.6 F (36.4 C) (02/11 0415) Temp Source: Axillary (02/11 0415) BP: 132/60 (02/11 0530) Pulse Rate: 68 (02/11 0530)  Labs: Recent Labs    07/13/22 1822 07/14/22 0510 07/15/22 0542 07/15/22 1938 07/15/22 2135 07/15/22 2136 07/15/22 2222 07/16/22 0502  HGB 13.9 13.4  --   --   --  12.2*  --   --   HCT 42.1 40.9  --   --   --  36.4*  --   --   PLT 188 152  --   --   --  148*  --   --   HEPARINUNFRC  --   --   --  0.37  --   --  0.25* 0.36  CREATININE  --  0.98 0.99  --  0.98  --   --   --      Estimated Creatinine Clearance: 44.6 mL/min (by C-G formula based on SCr of 0.98 mg/dL).   Medical History: Past Medical History:  Diagnosis Date   (HFpEF) heart failure with preserved ejection fraction (HCC)    Arthritis    Complete heart block ([place])    a.) s/p dual chamber Medtronic PPM placement 01/31/2016   Frequent PVCs    Hemorrhoids    a.) s/p banding 01/2021   HLD (hyperlipidemia)    Hypertension    Long term current use of antithrombotics/antiplatelets    a.) on DAPT (ASA + clopidogrel)   Peripheral vascular disease ([place])    Presence of permanent cardiac pacemaker 01/31/2016   a.) s/p Advisa DR MRI SureScan dual chamber PPM (SN: [id number])   SDH (subdural hematoma) (HCC)    Skin cancer    Type 2 diabetes mellitus treated with insulin (HCC)     Medications:  Medications Prior to Admission  Medication Sig Dispense Refill Last Dose   atorvastatin (LIPITOR) 40 MG tablet Take 1 tablet (40 mg total) by mouth every evening. 30 tablet 0 07/11/2022   clopidogrel (PLAVIX) 75 MG tablet Take 1 tablet (75 mg total) by mouth daily. 60 tablet 0 07/12/2022   diltiazem (CARDIZEM CD) 240 MG 24 hr capsule  Take 240 mg by mouth daily.   07/12/2022   gabapentin (NEURONTIN) 300 MG capsule Take 300 mg by mouth daily.   07/12/2022   JARDIANCE 10 MG TABS tablet Take 10 mg by mouth daily.   07/12/2022   lisinopril (ZESTRIL) 40 MG tablet Take 40 mg by mouth daily.   07/12/2022   loratadine (CLARITIN) 10 MG tablet Take 1 tablet by mouth daily.   07/12/2022   metFORMIN (GLUCOPHAGE) 500 MG tablet Take 500 mg by mouth 2 (two) times daily with a meal.   07/12/2022   NOVOLIN 70/30 (70-30) 100 UNIT/ML injection Inject 10 Units into the skin daily with breakfast. (Patient taking differently: Inject into the skin daily with breakfast. Inject subcutaneously once daily. Sliding scale) 10 mL 11 07/12/2022   Scheduled:   atorvastatin  40 mg Oral QPM   Chlorhexidine Gluconate Cloth  6 each Topical Daily   diltiazem  240 mg Oral Daily   gabapentin  300 mg Oral Daily   insulin aspart  0-5 Units Subcutaneous QHS   insulin aspart  0-9 Units Subcutaneous TID WC   lisinopril  40 mg Oral Daily   tirofiban  25 mcg/kg Intravenous Once   Infusions:   sodium chloride 125 mL/hr at 07/16/22 0500   ceFEPime (MAXIPIME) IV Stopped (07/15/22 2232)   heparin 800 Units/hr (07/16/22 0500)   metronidazole Stopped (07/16/22 0042)   norepinephrine Stopped (07/15/22 2211)   potassium PHOSPHATE IVPB (in mmol) 85 mL/hr at 07/16/22 0500   vancomycin     PRN: acetaminophen **OR** acetaminophen, hydrALAZINE, HYDROmorphone (DILAUDID) injection, morphine injection, norepinephrine, ondansetron **OR** ondansetron (ZOFRAN) IV, ondansetron (ZOFRAN) IV, senna-docusate Anti-infectives (From admission, onward)    Start     Dose/Rate Route Frequency Ordered Stop   07/16/22 1400  vancomycin (VANCOCIN) IVPB 1000 mg/200 mL premix        1,000 mg 200 mL/hr over 60 Minutes Intravenous Every 24 hours 07/15/22 1813     07/13/22 1603  ceFAZolin (ANCEF) IVPB 2g/100 mL premix        2 g 200 mL/hr over 30 Minutes Intravenous 30 min pre-op 07/13/22 1603 07/13/22 1718    07/13/22 1400  vancomycin (VANCOREADY) IVPB 750 mg/150 mL  Status:  Discontinued        750 mg 150 mL/hr over 60 Minutes Intravenous Every 24 hours 07/12/22 1328 07/15/22 1813   07/12/22 2200  ceFEPIme (MAXIPIME) 2 g in sodium chloride 0.9 % 100 mL IVPB        2 g 200 mL/hr over 30 Minutes Intravenous Every 12 hours 07/12/22 1328     07/12/22 1215  vancomycin (VANCOREADY) IVPB 1500 mg/300 mL        1,500 mg 150 mL/hr over 120 Minutes Intravenous  Once 07/12/22 1207 07/12/22 1548   07/12/22 1215  ceFEPIme (MAXIPIME) 2 g in sodium chloride 0.9 % 100 mL IVPB        2 g 200 mL/hr over 30 Minutes Intravenous  Once 07/12/22 1207 07/12/22 1315   07/12/22 1200  metroNIDAZOLE (FLAGYL) IVPB 500 mg        500 mg 100 mL/hr over 60 Minutes Intravenous Every 12 hours 07/12/22 1159 07/19/22 1159       Assessment: 89 y.o. male Charles Jennings is a 89 y.o. male with a history of diabetes, lower limb ischemia amputation sent in for evaluation by podiatrist. Patient underwent two lower extremity angiography (2/8 and 2/9). Podiatry may plan for amputation on Sunday if patient remains stable.    Discussed with Dr. Trula Slade, plan is to send pt home on asa and apixaban. Will start heparin for now while podiatry surgery is pending. Messaged podiatry if asa is ok to start on 2/10.   Goal of Therapy:  Heparin level 0.3-0.7 units/ml Monitor platelets by anticoagulation protocol: Yes  Date Time HL Rate/Comment 2/10 1938 0.37 Therapeutic x1 / 800 u/hr 2/11 0502 0.36 Therapeutic x 2  Plan:  Continue heparin infusion at 800 units/hr Heparin infusion scheduled to stop @ 0600 prior to procedure. Pharmacy to F/U regarding anticoag plan post-op.  Renda Rolls, PharmD, [facility] 07/16/2022 5:50 AM

## 2022-07-16 NOTE — Op Note (Signed)
Operative note   Surgeon:Nyomi Howser Lawyer: None    Preop diagnosis: 1.  Gangrene left second toe 2.  Abscess left third toe amputation site    Postop diagnosis: Same    Procedure:1.  Amputation left second toe MTPJ 2.  Excisional debridement necrotic tissue and abscess to and including tendon and muscle dorsal left foot 3.  Ankle block for postoperative anesthetic left ankle    EBL: 10 cc    Anesthesia:local and IV sedation    Hemostasis: None    Specimen: Gangrene left second toe and deep wound culture abscess left foot    Complications: None    Operative indications:Qamar Viona Gilmore Derenne is an 89 y.o. that presents today for surgical intervention.  The risks/benefits/alternatives/complications have been discussed and consent has been given.    Procedure:  Patient was brought into the OR and placed on the operating table in thesupine position. After anesthesia was obtained theleft lower extremity was prepped and draped in usual sterile fashion.  Attention was directed to the left foot where the dorsal aspect of the left foot had a noted hemorrhagic blister.  This was removed.  Deep to the blister was a large area of necrotic tissue proximal to the third toe amputation site.  The left second toe had gangrenous changes to the level of about the PIPJ.  At this time full-thickness incision was performed from the previous third toe amputation site circumferential around the base of the second toe at the MTPJ.  The second toe was disarticulated and removed from the surgical field and sent for pathological examination.  Attention was then directed to the dorsal aspect of the foot where the large necrotic tissue was noted.  This was excised in toto.  Excision and debridement was taken down to the level of and including muscle and tendon on the dorsal left foot.  Predebridement this was covered with necrotic tissue and postdebridement this measured 3-1/2 x 2-1/2 cm.  [place] performed with a  scalpel blade and scissors.  Abscessed area was noted during the incision and excision of the tissue.  A deep wound culture was performed in this area.  No further purulent drainage was noted after excision and debridement of this area.  The wound was flushed with copious amounts of irrigation.  Bleeding was noted within the wound.  Gentle compression was applied initially.  The distal aspect of the third and second toe amputation sites were gently closed and loosely closed with a 3-0 nylon.  The remaining wound was then infiltrated with Surgiflo with topical thrombin.  At this time the foot was covered with Xeroform to the open wound and a bulky sterile dressing was then applied.    Patient tolerated the procedure and anesthesia well.  Was transported from the OR to the PACU with all vital signs stable and vascular status intact. To be discharged per routine protocol.

## 2022-07-16 NOTE — Anesthesia Postprocedure Evaluation (Signed)
Anesthesia Post Note  Patient: SINAN SHIVELY  Procedure(s) Performed: AMPUTATION TOE (Left: Toe)  Patient location during evaluation: PACU Anesthesia Type: General Level of consciousness: awake and alert Pain management: pain level controlled Vital Signs Assessment: post-procedure vital signs reviewed and stable Respiratory status: spontaneous breathing, nonlabored ventilation, respiratory function stable and patient connected to nasal cannula oxygen Cardiovascular status: blood pressure returned to baseline and stable Postop Assessment: no apparent nausea or vomiting Anesthetic complications: no   No notable events documented.   Last Vitals:  Vitals:   07/16/22 1245 07/16/22 1320  BP: (!) 142/58   Pulse: 60   Resp: 11   Temp: (!) 36.3 C (!) 36.3 C  SpO2: 100%     Last Pain:  Vitals:   07/16/22 1320  TempSrc: Oral  PainSc:                  Ilene Qua

## 2022-07-16 NOTE — Plan of Care (Signed)
  Problem: Education: Goal: Knowledge of General Education information will improve Description: Including pain rating scale, medication(s)/side effects and non-pharmacologic comfort measures Outcome: Progressing   Problem: Health Behavior/Discharge Planning: Goal: Ability to manage health-related needs will improve Outcome: Progressing   Problem: Activity: Goal: Risk for activity intolerance will decrease Outcome: Not Progressing   Problem: Nutrition: Goal: Adequate nutrition will be maintained Outcome: Not Progressing   Problem: Coping: Goal: Level of anxiety will decrease Outcome: Progressing   Problem: Pain Managment: Goal: General experience of comfort will improve Outcome: Progressing   Problem: Safety: Goal: Ability to remain free from injury will improve Outcome: Not Progressing   Problem: Skin Integrity: Goal: Risk for impaired skin integrity will decrease Outcome: Not Progressing   Problem: Education: Goal: Ability to describe self-care measures that may prevent or decrease complications (Diabetes Survival Skills Education) will improve Outcome: Not Progressing Goal: Individualized Educational Video(s) Outcome: Not Progressing   Problem: Coping: Goal: Ability to adjust to condition or change in health will improve Outcome: Progressing   Problem: Fluid Volume: Goal: Ability to maintain a balanced intake and output will improve Outcome: Not Progressing   Problem: Health Behavior/Discharge Planning: Goal: Ability to identify and utilize available resources and services will improve Outcome: Not Progressing Goal: Ability to manage health-related needs will improve Outcome: Not Progressing   Problem: Metabolic: Goal: Ability to maintain appropriate glucose levels will improve Outcome: Not Progressing   Problem: Nutritional: Goal: Maintenance of adequate nutrition will improve Outcome: Not Progressing Goal: Progress toward achieving an optimal weight  will improve Outcome: Not Progressing   Problem: Skin Integrity: Goal: Risk for impaired skin integrity will decrease Outcome: Not Progressing   Problem: Tissue Perfusion: Goal: Adequacy of tissue perfusion will improve Outcome: Not Progressing   Problem: Education: Goal: Ability to identify signs and symptoms of gastrointestinal bleeding will improve Outcome: Not Progressing   Problem: Bowel/Gastric: Goal: Will show no signs and symptoms of gastrointestinal bleeding Outcome: Not Progressing   Problem: Fluid Volume: Goal: Will show no signs and symptoms of excessive bleeding Outcome: Not Progressing   Problem: Clinical Measurements: Goal: Complications related to the disease process, condition or treatment will be avoided or minimized Outcome: Not Progressing   Problem: Education: Goal: Knowledge of the prescribed therapeutic regimen will improve Outcome: Not Progressing Goal: Ability to verbalize activity precautions or restrictions will improve Outcome: Not Progressing Goal: Understanding of discharge needs will improve Outcome: Not Progressing   Problem: Activity: Goal: Ability to perform//tolerate increased activity and mobilize with assistive devices will improve Outcome: Not Progressing   Problem: Clinical Measurements: Goal: Postoperative complications will be avoided or minimized Outcome: Not Progressing   Problem: Self-Care: Goal: Ability to meet self-care needs will improve Outcome: Progressing   Problem: Self-Concept: Goal: Ability to maintain and perform role responsibilities to the fullest extent possible will improve Outcome: Progressing   Problem: Pain Management: Goal: Pain level will decrease with appropriate interventions Outcome: Progressing   Problem: Skin Integrity: Goal: Demonstration of wound healing without infection will improve Outcome: Not Progressing   Problem: Clinical Measurements: Goal: Ability to maintain clinical  measurements within normal limits will improve Outcome: Progressing Goal: Will remain free from infection Outcome: Not Progressing Goal: Diagnostic test results will improve Outcome: Not Progressing Goal: Respiratory complications will improve Outcome: Progressing Goal: Cardiovascular complication will be avoided Outcome: Not Progressing

## 2022-07-16 NOTE — Progress Notes (Signed)
   07/16/22 1700  Spiritual Encounters  Type of Visit Initial  Care provided to: Patient  Referral source Nurse (RN/NT/LPN)  Reason for visit Routine spiritual support  OnCall Visit Yes   Chaplain was rounding in ICU and nurse suggested patient would like to talk. Patient entered room and conversed at bedside with patient.

## 2022-07-17 ENCOUNTER — Other Ambulatory Visit (HOSPITAL_COMMUNITY): Payer: Self-pay

## 2022-07-17 ENCOUNTER — Encounter: Payer: Self-pay | Admitting: Podiatry

## 2022-07-17 DIAGNOSIS — M869 Osteomyelitis, unspecified: Secondary | ICD-10-CM | POA: Diagnosis not present

## 2022-07-17 DIAGNOSIS — F22 Delusional disorders: Secondary | ICD-10-CM

## 2022-07-17 LAB — MAGNESIUM: Magnesium: 1.6 mg/dL — ABNORMAL LOW (ref 1.7–2.4)

## 2022-07-17 LAB — CULTURE, BLOOD (ROUTINE X 2)
Culture: NO GROWTH
Culture: NO GROWTH

## 2022-07-17 LAB — CBC
HCT: 42.9 % (ref 39.0–52.0)
Hemoglobin: 14.4 g/dL (ref 13.0–17.0)
MCH: 29 pg (ref 26.0–34.0)
MCHC: 33.6 g/dL (ref 30.0–36.0)
MCV: 86.3 fL (ref 80.0–100.0)
Platelets: 217 10*3/uL (ref 150–400)
RBC: 4.97 MIL/uL (ref 4.22–5.81)
RDW: 13.9 % (ref 11.5–15.5)
WBC: 11.6 10*3/uL — ABNORMAL HIGH (ref 4.0–10.5)
nRBC: 0 % (ref 0.0–0.2)

## 2022-07-17 LAB — BASIC METABOLIC PANEL
Anion gap: 9 (ref 5–15)
BUN: 19 mg/dL (ref 8–23)
CO2: 23 mmol/L (ref 22–32)
Calcium: 7.3 mg/dL — ABNORMAL LOW (ref 8.9–10.3)
Chloride: 106 mmol/L (ref 98–111)
Creatinine, Ser: 0.86 mg/dL (ref 0.61–1.24)
GFR, Estimated: >60 mL/min (ref >60)
Glucose, Bld: 186 mg/dL — ABNORMAL HIGH (ref 70–99)
Potassium: 3.7 mmol/L (ref 3.5–5.1)
Sodium: 138 mmol/L (ref 135–145)

## 2022-07-17 LAB — HEPARIN LEVEL (UNFRACTIONATED): Heparin Unfractionated: 0.23 IU/mL — ABNORMAL LOW (ref 0.30–0.70)

## 2022-07-17 LAB — VITAMIN D 25 HYDROXY (VIT D DEFICIENCY, FRACTURES)

## 2022-07-17 LAB — GLUCOSE, CAPILLARY
Glucose-Capillary: 145 mg/dL — ABNORMAL HIGH (ref 70–99)
Glucose-Capillary: 218 mg/dL — ABNORMAL HIGH (ref 70–99)
Glucose-Capillary: 188 mg/dL — ABNORMAL HIGH (ref 70–99)

## 2022-07-17 LAB — PHOSPHORUS: Phosphorus: 1.9 mg/dL — ABNORMAL LOW (ref 2.5–4.6)

## 2022-07-17 MED ORDER — HYDRALAZINE HCL 50 MG PO TABS: 50.0000 mg | ORAL_TABLET | Freq: Four times a day (QID) | ORAL | Status: DC | PRN

## 2022-07-17 MED ORDER — SODIUM CHLORIDE 0.9 % IV SOLN
3.0000 g | Freq: Four times a day (QID) | INTRAVENOUS | Status: DC
  Administered 2022-07-17 – 2022-07-20 (×11): 3 g via INTRAVENOUS
  Filled 2022-07-17: qty 3
  Filled 2022-07-17: qty 8
  Filled 2022-07-17 (×2): qty 3
  Filled 2022-07-17 (×4): qty 8
  Filled 2022-07-17 (×2): qty 3
  Filled 2022-07-17: qty 8
  Filled 2022-07-17: qty 3
  Filled 2022-07-17 (×2): qty 8
  Filled 2022-07-17: qty 3

## 2022-07-17 MED ORDER — ORAL CARE MOUTH RINSE: 15.0000 mL | OROMUCOSAL | Status: DC | PRN

## 2022-07-17 MED ORDER — APIXABAN 2.5 MG PO TABS
2.5000 mg | ORAL_TABLET | Freq: Two times a day (BID) | ORAL | Status: DC
  Administered 2022-07-17 – 2022-07-21 (×8): 2.5 mg via ORAL
  Filled 2022-07-17 (×9): qty 1

## 2022-07-17 MED ORDER — POTASSIUM PHOSPHATES 15 MMOLE/5ML IV SOLN
30.0000 mmol | Freq: Once | INTRAVENOUS | Status: AC
  Administered 2022-07-17: 30 mmol via INTRAVENOUS
  Filled 2022-07-17: qty 10

## 2022-07-17 MED ORDER — HYDRALAZINE HCL 20 MG/ML IJ SOLN: 10.0000 mg | Freq: Four times a day (QID) | INTRAMUSCULAR | Status: DC | PRN

## 2022-07-17 MED ORDER — ASPIRIN 81 MG PO TBEC
81.0000 mg | DELAYED_RELEASE_TABLET | Freq: Every day | ORAL | Status: DC
  Administered 2022-07-18 – 2022-07-21 (×4): 81 mg via ORAL
  Filled 2022-07-17 (×5): qty 1

## 2022-07-17 MED ORDER — MAGNESIUM SULFATE 4 GM/100ML IV SOLN
4.0000 g | Freq: Once | INTRAVENOUS | Status: AC
  Administered 2022-07-17: 4 g via INTRAVENOUS
  Filled 2022-07-17: qty 100

## 2022-07-17 NOTE — TOC Initial Note (Signed)
Transition of Care [facility]) - Initial/Assessment Note    Patient Details  Name: Charles Jennings MRN: [id number] Date of Birth: 11-10-1927  Transition of Care [facility]) CM/SW Contact:    Shelbie Hutching, RN Phone Number: 07/17/2022, 11:10 AM  Clinical Narrative:                 Patient admitted to the hospital with osteomyelitis s/p left toe amputation.  RNCm met with patient at the bedside he has family with him, a grandson and his wife, great granddaughter and her fiance.  RNCM introduced self and explained role in DC planning.  Patient is from home in [place] where he lives alone.  PT has recommended short term rehab and patient agrees, he has never been before.  Family is leaning toward [facility] in [place].  RNCM will start bed search.    Expected Discharge Plan: Skilled Nursing Facility Barriers to Discharge: Continued Medical Work up   Patient Goals and CMS Choice Patient states their goals for this hospitalization and ongoing recovery are:: Agrees to short term rehab CMS Medicare.gov Compare Post Acute Care list provided to:: Patient Choice offered to / list presented to : Patient      Expected Discharge Plan and Services   Discharge Planning Services: CM Consult Post Acute Care Choice: [place] Living arrangements for the past 2 months: Apartment                 DME Arranged: N/A DME Agency: NA       HH Arranged: NA [place] Agency: NA        Prior Living Arrangements/Services Living arrangements for the past 2 months: Apartment Lives with:: Self Patient language and need for interpreter reviewed:: Yes Do you feel safe going back to the place where you live?: Yes      Need for Family Participation in Patient Care: Yes (Comment) Care giver support system in place?: Yes (comment) Current home services: DME Criminal Activity/Legal Involvement Pertinent to Current Situation/Hospitalization: No - Comment as needed  Activities of Daily Living Home Assistive  Devices/Equipment: Walker (specify type) ADL Screening (condition at time of admission) Patient's cognitive ability adequate to safely complete daily activities?: Yes Is the patient deaf or have difficulty hearing?: No Does the patient have difficulty seeing, even when wearing glasses/contacts?: No Does the patient have difficulty concentrating, remembering, or making decisions?: No Patient able to express need for assistance with ADLs?: Yes Does the patient have difficulty dressing or bathing?: No Independently performs ADLs?: No Communication: Independent Dressing (OT): Independent Grooming: Independent Feeding: Independent Bathing: Independent Toileting: Needs assistance Is this a change from baseline?: Change from baseline, expected to last >3days In/Out Bed: Needs assistance Is this a change from baseline?: Change from baseline, expected to last >3 days Walks in Home: Needs assistance Is this a change from baseline?: Change from baseline, expected to last >3 days Does the patient have difficulty walking or climbing stairs?: Yes Weakness of Legs: Both Weakness of Arms/Hands: None  Permission Sought/Granted Permission sought to share information with : Case Manager, Family Supports Permission granted to share information with : Yes, Verbal Permission Granted  Share Information with NAME: Laural Benes  Permission granted to share info w AGENCY: SNF's  Permission granted to share info w Relationship: daughter  Permission granted to share info w Contact Information: [phone]  Emotional Assessment Appearance:: Appears stated age Attitude/Demeanor/Rapport: Engaged Affect (typically observed): Accepting Orientation: : Oriented to Self, Oriented to Place, Oriented to  Time, Oriented  to Situation Alcohol / Substance Use: Not Applicable Psych Involvement: No (comment)  Admission diagnosis:  Osteomyelitis ([place]) [M86.9] Gangrene of toe ([place]) [I96] Patient Active Problem List    Diagnosis Date Noted   Gangrene of toe ([place]) 07/15/2022   Osteomyelitis ([place]) 07/12/2022   Stage 3a chronic kidney disease (CKD) ([place]) 07/12/2022   Weakness 07/12/2022   Critical limb ischemia of left lower extremity with ulceration of foot ([place]) 06/15/2022   Nausea and vomiting 06/13/2022   Critical limb ischemia of left lower extremity ([place]) 06/12/2022   Limb ischemia 06/11/2022   CKD (chronic kidney disease) 06/11/2022   Chronic diastolic CHF (congestive heart failure), NYHA class 2 ([place]) 03/07/2018   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/2016   Benign essential HTN 02/11/2016   Complete heart block ([place]) 02/11/2016   Mixed hyperlipidemia 02/11/2016   Insulin dependent type 2 diabetes mellitus ([place]) 01/28/2016   Subdural hemorrhage ([place]) 01/28/2016   PCP:  Albina Billet, MD Pharmacy:   [facility] [address], [place] [address] Phone: [phone] Fax: [facility] [id number] - [place], [place] [address] Phone: [phone] Fax: [phone]     Social Determinants of Health (SDOH) Social History: SDOH Screenings   Food Insecurity: No Food Insecurity (07/12/2022)  Housing: Low Risk  (07/12/2022)  Transportation Needs: No Transportation Needs (07/12/2022)  Utilities: Not At Risk (07/12/2022)  Tobacco Use: Low Risk  (07/17/2022)  Recent Concern: Tobacco Use - Medium Risk (07/12/2022)   SDOH Interventions:     Readmission Risk Interventions     No data to display

## 2022-07-17 NOTE — Progress Notes (Signed)
Patient agreeable to blood draw from central line. Family and psych NP at bedside.

## 2022-07-17 NOTE — Progress Notes (Signed)
Patient agreeable to check CBG. Still refusing blood draw at this time.

## 2022-07-17 NOTE — Progress Notes (Signed)
Patient refusing blood draws and assessment of central line. Patient is A&O x4. Patient exhibits paranoia. He states he does not trust [place] and does not feel safe. He does not endorse any precipitating events to cause these feelings. Patient educated on plan of care and need for lab draws and assessment. When asked if there was anything I could do to make him feel better he stated to see people he knows from outside.Spoke with patient's daughter and updated her. She reached out this am to local family members to see if they would visit. Two family members now present at bedside.

## 2022-07-17 NOTE — NC FL2 (Signed)
[place] LEVEL OF CARE FORM     IDENTIFICATION  Patient Name: Charles Jennings Birthdate: 1927-11-24 Sex: male Admission Date (Current Location): 07/12/2022  [facility] [place] and [place] Number:  Engineering geologist and Address:  [facility], [address], [place], [place] [address]      Provider Number: [id number]  Attending Physician Name and Address:  Val Riles, MD  Relative Name and Phone Number:  Laural Benes- daughter- [phone]    Current Level of Care: Hospital Recommended Level of Care: [place] Prior Approval Number:    Date Approved/Denied:   PASRR Number: [id number]  Discharge Plan: SNF    Current Diagnoses: Patient Active Problem List   Diagnosis Date Noted   Gangrene of toe ([place]) 07/15/2022   Osteomyelitis ([place]) 07/12/2022   Stage 3a chronic kidney disease (CKD) ([place]) 07/12/2022   Weakness 07/12/2022   Critical limb ischemia of left lower extremity with ulceration of foot ([place]) 06/15/2022   Nausea and vomiting 06/13/2022   Critical limb ischemia of left lower extremity ([place]) 06/12/2022   Limb ischemia 06/11/2022   CKD (chronic kidney disease) 06/11/2022   Chronic diastolic CHF (congestive heart failure), NYHA class 2 ([place]) 03/07/2018   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/2016   Benign essential HTN 02/11/2016   Complete heart block ([place]) 02/11/2016   Mixed hyperlipidemia 02/11/2016   Insulin dependent type 2 diabetes mellitus ([place]) 01/28/2016   Subdural hemorrhage ([place]) 01/28/2016    Orientation RESPIRATION BLADDER Height & Weight     Self, Time, Situation, Place  Normal Continent Weight: 65.5 kg Height:  5' 7.5" (171.5 cm)  BEHAVIORAL SYMPTOMS/MOOD NEUROLOGICAL BOWEL NUTRITION STATUS      Continent Diet (heart healthy)  AMBULATORY STATUS COMMUNICATION OF NEEDS Skin   Limited Assist Verbally Surgical wounds (left foot, surgical dressing intact)                       Personal Care Assistance  Level of Assistance  Bathing, Feeding, Dressing Bathing Assistance: Limited assistance Feeding assistance: Limited assistance Dressing Assistance: Limited assistance     Functional Limitations Info  Sight, Hearing, Speech Sight Info: Impaired Hearing Info: Impaired Speech Info: Adequate    SPECIAL CARE FACTORS FREQUENCY  PT (By licensed PT), OT (By licensed OT)     PT Frequency: 5 times per week OT Frequency: 5 times per week            Contractures Contractures Info: Not present    Additional Factors Info  Code Status, Allergies Code Status Info: Full Allergies Info: NKA           Current Medications (07/17/2022):  This is the current hospital active medication list Current Facility-Administered Medications  Medication Dose Route Frequency Provider Last Rate Last Admin   0.9 %  sodium chloride infusion   Intravenous Continuous Val Riles, MD 75 mL/hr at 07/17/22 1006 New Bag at 07/17/22 1006   0.9 %  sodium chloride infusion   Intravenous PRN Val Riles, MD 10 mL/hr at 07/17/22 0900 Infusion Verify at 07/17/22 0900   acetaminophen (TYLENOL) tablet 650 mg  650 mg Oral Q6H PRN Samara Deist, DPM       Or   acetaminophen (TYLENOL) suppository 650 mg  650 mg Rectal Q6H PRN Samara Deist, DPM       atorvastatin (LIPITOR) tablet 40 mg  40 mg Oral QPM Samara Deist, DPM   40 mg at 07/16/22 1730   ceFEPIme (MAXIPIME) 2 g in  sodium chloride 0.9 % 100 mL IVPB  2 g Intravenous Q12H Samara Deist, DPM 200 mL/hr at 07/17/22 1116 2 g at 07/17/22 1116   Chlorhexidine Gluconate Cloth 2 % PADS 6 each  6 each Topical Daily Samara Deist, DPM   6 each at 07/17/22 1103   diltiazem (CARDIZEM CD) 24 hr capsule 240 mg  240 mg Oral Daily Samara Deist, DPM   240 mg at 07/14/22 1312   gabapentin (NEURONTIN) capsule 300 mg  300 mg Oral Daily Samara Deist, DPM   300 mg at 07/17/22 1103   hydrALAZINE (APRESOLINE) injection 10 mg  10 mg Intravenous Q6H PRN Val Riles, MD        hydrALAZINE (APRESOLINE) tablet 50 mg  50 mg Oral Q6H PRN Val Riles, MD       HYDROmorphone (DILAUDID) injection 1 mg  1 mg Intravenous Once PRN Samara Deist, DPM       insulin aspart (novoLOG) injection 0-5 Units  0-5 Units Subcutaneous QHS Samara Deist, DPM       insulin aspart (novoLOG) injection 0-9 Units  0-9 Units Subcutaneous TID WC Samara Deist, DPM   3 Units at 07/17/22 1117   lisinopril (ZESTRIL) tablet 40 mg  40 mg Oral Daily Samara Deist, DPM   40 mg at 07/17/22 1103   magnesium sulfate IVPB 4 g 100 mL  4 g Intravenous Once Val Riles, MD       metroNIDAZOLE (FLAGYL) IVPB 500 mg  500 mg Intravenous Q12H Samara Deist, DPM   Stopped at 07/17/22 0015   morphine (PF) 2 MG/ML injection 2 mg  2 mg Intravenous Q4H PRN Samara Deist, DPM   2 mg at 07/14/22 1011   ondansetron (ZOFRAN) tablet 4 mg  4 mg Oral Q6H PRN Samara Deist, DPM       Or   ondansetron (ZOFRAN) injection 4 mg  4 mg Intravenous Q6H PRN Samara Deist, DPM       ondansetron [facility]) injection 4 mg  4 mg Intravenous Q6H PRN Samara Deist, DPM   4 mg at 07/15/22 [id number]   potassium PHOSPHATE 30 mmol in dextrose 5 % 500 mL infusion  30 mmol Intravenous Once Val Riles, MD       senna-docusate (Senokot-S) tablet 1 tablet  1 tablet Oral QHS PRN Samara Deist, DPM       vancomycin (VANCOCIN) IVPB 1000 mg/200 mL premix  1,000 mg Intravenous Q24H Samara Deist, DPM   Stopped at 07/16/22 1712     Discharge Medications: Please see discharge summary for a list of discharge medications.  Relevant Imaging Results:  Relevant Lab Results:   Additional Information SS# [id number]  Shelbie Hutching, RN

## 2022-07-17 NOTE — Progress Notes (Addendum)
Progress Note    07/17/2022 2:00 PM 1 Day Post-Op  Subjective:  POD #3 status post mechanical thrombectomy of the left SFA, popliteal and tibial vessels with stent placement of the SFA and above-knee popliteal artery. Complaining of pain in his toes.  His leg feels better. Leg not as red today. No other complaints overnight. Vitals all remain stable.   He is also POD #1 by Podiatry from amputation left second toe with I&D of dorsal left foot.  Intraoperatively large abscess was noted on the dorsal foot with a large area of necrotic tissue.  Excision of this material and the area was flushed.  Dorsal wound was left open and covered with nonadherent bandage.  Amputation site was closed distally.    Vitals:   07/17/22 1300 07/17/22 1320  BP: 133/62   Pulse: 90 89  Resp: (!) 23 (!) 25  Temp:    SpO2: 100% 100%   Physical Exam: Cardiac:  RRR Lungs:  Normal respiratory effort. Diminished in the bases. No wheezing or crackles noted.  Incisions:  Right groin dressing is clean dry and intact.  Extremities:  Left lower extremity warm to touch. Not as cellulitic looking.  Abdomen:  Positive bowel sounds, soft, flat, NT/ND Neurologic: A&OX3 Neuro Intact.   CBC    Component Value Date/Time   WBC 11.6 (H) 07/17/2022 1000   RBC 4.97 07/17/2022 1000   HGB 14.4 07/17/2022 1000   HGB 15.5 01/22/2013 1546   HCT 42.9 07/17/2022 1000   HCT 45.4 01/22/2013 1546   PLT 217 07/17/2022 1000   PLT 203 01/22/2013 1546   MCV 86.3 07/17/2022 1000   MCV 88 01/22/2013 1546   MCH 29.0 07/17/2022 1000   MCHC 33.6 07/17/2022 1000   RDW 13.9 07/17/2022 1000   RDW 13.9 01/22/2013 1546   LYMPHSABS 1.7 07/15/2022 2136   MONOABS 1.7 (H) 07/15/2022 2136   EOSABS 0.0 07/15/2022 2136   BASOSABS 0.1 07/15/2022 2136    BMET    Component Value Date/Time   NA 138 07/17/2022 1000   NA 137 01/22/2013 1546   K 3.7 07/17/2022 1000   K 3.9 01/22/2013 1546   CL 106 07/17/2022 1000   CL 104 01/22/2013 1546    CO2 23 07/17/2022 1000   CO2 28 01/22/2013 1546   GLUCOSE 186 (H) 07/17/2022 1000   GLUCOSE 211 (H) 01/22/2013 1546   BUN 19 07/17/2022 1000   BUN 18 01/22/2013 1546   CREATININE 0.86 07/17/2022 1000   CREATININE 1.35 (H) 01/22/2013 1546   CALCIUM 7.3 (L) 07/17/2022 1000   CALCIUM 8.9 01/22/2013 1546   GFRNONAA >60 07/17/2022 1000   GFRNONAA 48 (L) 01/22/2013 1546   GFRAA >60 01/28/2016 1259   GFRAA 56 (L) 01/22/2013 1546    INR    Component Value Date/Time   INR 1.1 07/12/2022 1201     Intake/Output Summary (Last 24 hours) at 07/17/2022 1400 Last data filed at 07/17/2022 1300 Gross per 24 hour  Intake 3852.16 ml  Output 1550 ml  Net 2302.16 ml     Assessment/Plan:  89 y.o. male is POD #3 from  mechanical thrombectomy of the left SFA, popliteal and tibial vessels with stent placement of the SFA and above-knee popliteal artery. 1 Day Post-Op  He is also POD #1 today from toe amputation.   Patient is recovering and healing as expected.  He has excellent blood flow to the ankle.  Podiatry took the patient to surgery for toe amputation today.  DVT prophylaxis:  ASA 81 mg and Eliquis 2.31m BID   [facility] 07/17/2022 2:00 PM

## 2022-07-17 NOTE — Consult Note (Addendum)
[facility] Consult   Reason for Consult:  paranoid over getting blood drawn Referring Physician:  Florina Ou Patient Identification: Charles Jennings MRN:  [id number] Principal Diagnosis: Paranoia ([place]) Diagnosis:  Principal Problem:   Paranoia ([place]) Active Problems:   Benign essential HTN   Insulin dependent type 2 diabetes mellitus ([place])   Mixed hyperlipidemia   Limb ischemia   Osteomyelitis ([place])   Stage 3a chronic kidney disease (CKD) ([place])   Weakness   Gangrene of toe ([place])   Total Time spent with patient: 30 minutes  Subjective: "I just don't want anyone to kill me."  Charles Jennings is a 89 y.o. male patient admitted with osteomyelitis.  Consult for psychiatry was placed because patient did not want his blood to be drawn.   HPI:  Patient was admitted for medical problems as above. Apparently, patient expressed that he did not want his blood drawn, and was acting paranoid. Family was called to bedside and they were there when this Probation officer arrived.   On evaluation, patient is alert and oriented x 4. He is very pleasant, interacting appropriately with family. Patient denies any suicidal ideation, in fact, telling Probation officer he does not want to die. He said "I am not afraid to die when God takes me." Writer asked patient why he did not want the nurse to take his blood, and he stated that he was afraid that maybe we were going to "give him something that might kill him." He also expressed concern about the level of noise in the unit (another patient is yelling out), and people walking by outside the door make him a little nervous. Patient states he feels much better now with his family there. He is surrounded by about 6 family members, who are attentive. Discussed with patient and family sources of confusion in the hospital. After discussion regarding the need for blood draw to make sure we are taking the best care of him and giving him the right medications, patient agreed  to have his blood drawn.   Assured patient that we are not trying to kill him.    Past Psychiatric History: none reported  Risk to Self:   Risk to Others:   Prior Inpatient Therapy:   Prior Outpatient Therapy:    Past Medical History:  Past Medical History:  Diagnosis Date   (HFpEF) heart failure with preserved ejection fraction ([place])    Arthritis    Complete heart block ([place])    a.) s/p dual chamber Medtronic PPM placement 01/31/2016   Frequent PVCs    Hemorrhoids    a.) s/p banding 01/2021   HLD (hyperlipidemia)    Hypertension    Long term current use of antithrombotics/antiplatelets    a.) on DAPT (ASA + clopidogrel)   Peripheral vascular disease ([place])    Presence of permanent cardiac pacemaker 01/31/2016   a.) s/p Advisa DR MRI SureScan dual chamber PPM (SN: [id number])   SDH (subdural hematoma) (HCC)    Skin cancer    Type 2 diabetes mellitus treated with insulin [facility])     Past Surgical History:  Procedure Laterality Date   AMPUTATION TOE Left 06/30/2022   Procedure: AMPUTATION TOE - [place];  Surgeon: Sharlotte Alamo, DPM;  Location: [facility] ORS;  Service: Podiatry;  Laterality: Left;   AMPUTATION TOE Left 07/16/2022   Procedure: AMPUTATION TOE;  Surgeon: Samara Deist, DPM;  Location: [facility] ORS;  Service: Podiatry;  Laterality: Left;   EYE SURGERY  FEMORAL ENDARTERECTOMY Bilateral    HEMORRHOID BANDING N/A 01/2021   HERNIA REPAIR     double hernia   LOWER EXTREMITY ANGIOGRAPHY Left 06/12/2022   Procedure: Lower Extremity Angiography;  Surgeon: Algernon Huxley, MD;  Location: [place] [facility];  Service: Cardiovascular;  Laterality: Left;   LOWER EXTREMITY ANGIOGRAPHY Left 07/13/2022   Procedure: Lower Extremity Angiography;  Surgeon: Algernon Huxley, MD;  Location: [place] [facility];  Service: Cardiovascular;  Laterality: Left;   LOWER EXTREMITY ANGIOGRAPHY Left 07/14/2022   Procedure: Lower Extremity Angiography;  Surgeon: Algernon Huxley, MD;   Location: [place] [facility];  Service: Cardiovascular;  Laterality: Left;   LOWER EXTREMITY INTERVENTION Bilateral 06/14/2022   Procedure: LOWER EXTREMITY INTERVENTION;  Surgeon: Algernon Huxley, MD;  Location: [place] [facility];  Service: Cardiovascular;  Laterality: Bilateral;   PACEMAKER INSERTION Left 01/31/2016   Procedure: PACEMAKER INSERTION; Location: [facility]; Surgeon: Dimas Alexandria, MD   Family History:  Family History  Problem Relation Age of Onset   Liver disease Father    Hypertension Brother    Heart attack Brother    Alpha-1 antitrypsin deficiency Granddaughter    Family Psychiatric  History:  Social History:  Social History   Substance and Sexual Activity  Alcohol Use Not Currently     Social History   Substance and Sexual Activity  Drug Use Never    Social History   Socioeconomic History   Marital status: Widowed    Spouse name: Not on file   Number of children: Not on file   Years of education: Not on file   Highest education level: Not on file  Occupational History   Not on file  Tobacco Use   Smoking status: Never   Smokeless tobacco: Never  Substance and Sexual Activity   Alcohol use: Not Currently   Drug use: Never   Sexual activity: Not Currently  Other Topics Concern   Not on file  Social History Narrative   Lives alone   Social Determinants of Health   Financial Resource Strain: Not on file  Food Insecurity: No Food Insecurity (07/12/2022)   Hunger Vital Sign    Worried About Running Out of Food in the Last Year: Never true    Ran Out of Food in the Last Year: Never true  Transportation Needs: No Transportation Needs (07/12/2022)   PRAPARE - Hydrologist (Medical): No    Lack of Transportation (Non-Medical): No  Physical Activity: Not on file  Stress: Not on file  Social Connections: Not on file   Additional Social History:    Allergies:  No Known Allergies  Labs:  Results for orders placed or  performed during the hospital encounter of 07/12/22 (from the past 48 hour(s))  Vancomycin, trough     Status: Abnormal   Collection Time: 07/15/22  2:34 PM  Result Value Ref Range   Vancomycin Tr 11 (L) 15 - 20 ug/mL    Comment: Performed at [facility], [address] [place], [place] [address]  Glucose, capillary     Status: Abnormal   Collection Time: 07/15/22  4:39 PM  Result Value Ref Range   Glucose-Capillary 308 (H) 70 - 99 mg/dL    Comment: Glucose reference range applies only to samples taken after fasting for at least 8 hours.  Vancomycin, peak     Status: Abnormal   Collection Time: 07/15/22  5:13 PM  Result Value Ref Range   Vancomycin  Pk 23 (L) 30 - 40 ug/mL    Comment: Performed at [facility], [place], [place] [address]  Heparin level (unfractionated)     Status: None   Collection Time: 07/15/22  7:38 PM  Result Value Ref Range   Heparin Unfractionated 0.37 0.30 - 0.70 IU/mL    Comment: (NOTE) The clinical reportable range upper limit is being lowered to >1.10 to align with the FDA approved guidance for the current laboratory assay.  If heparin results are below expected values, and patient dosage has  been confirmed, suggest follow up testing of antithrombin III levels. Performed at [facility], [address] [place], [place] [address]   Comprehensive metabolic panel     Status: Abnormal   Collection Time: 07/15/22  9:35 PM  Result Value Ref Range   Sodium 133 (L) 135 - 145 mmol/L   Potassium 3.4 (L) 3.5 - 5.1 mmol/L   Chloride 106 98 - 111 mmol/L   CO2 22 22 - 32 mmol/L   Glucose, Bld 133 (H) 70 - 99 mg/dL    Comment: Glucose reference range applies only to samples taken after fasting for at least 8 hours.   BUN 31 (H) 8 - 23 mg/dL   Creatinine, Ser 0.98 0.61 - 1.24 mg/dL   Calcium 7.5 (L) 8.9 - 10.3 mg/dL   Total Protein 4.0 (L) 6.5 - 8.1 g/dL   Albumin 1.7 (L) 3.5 - 5.0 g/dL   AST 25 15 - 41 U/L   ALT  16 0 - 44 U/L   Alkaline Phosphatase 137 (H) 38 - 126 U/L   Total Bilirubin 0.5 0.3 - 1.2 mg/dL   GFR, Estimated >60 >60 mL/min    Comment: (NOTE) Calculated using the CKD-EPI Creatinine Equation (2021)    Anion gap 5 5 - 15    Comment: Performed at [facility], [address] [place], [place] [address]  Magnesium     Status: None   Collection Time: 07/15/22  9:36 PM  Result Value Ref Range   Magnesium 1.9 1.7 - 2.4 mg/dL    Comment: Performed at [facility], [address] [place], [place] [address]  Phosphorus     Status: Abnormal   Collection Time: 07/15/22  9:36 PM  Result Value Ref Range   Phosphorus 1.5 (L) 2.5 - 4.6 mg/dL    Comment: Performed at [facility], [address] [place], [place] [address]  CBC with Differential/Platelet     Status: Abnormal   Collection Time: 07/15/22  9:36 PM  Result Value Ref Range   WBC 14.1 (H) 4.0 - 10.5 K/uL   RBC 4.15 (L) 4.22 - 5.81 MIL/uL   Hemoglobin 12.2 (L) 13.0 - 17.0 g/dL   HCT 36.4 (L) 39.0 - 52.0 %   MCV 87.7 80.0 - 100.0 fL   MCH 29.4 26.0 - 34.0 pg   MCHC 33.5 30.0 - 36.0 g/dL   RDW 14.0 11.5 - 15.5 %   Platelets 148 (L) 150 - 400 K/uL   nRBC 0.0 0.0 - 0.2 %   Neutrophils Relative % 74 %   Neutro Abs 10.5 (H) 1.7 - 7.7 K/uL   Lymphocytes Relative 12 %   Lymphs Abs 1.7 0.7 - 4.0 K/uL   Monocytes Relative 12 %   Monocytes Absolute 1.7 (H) 0.1 - 1.0 K/uL   Eosinophils Relative 0 %   Eosinophils Absolute 0.0 0.0 - 0.5 K/uL   Basophils Relative 1 %  Basophils Absolute 0.1 0.0 - 0.1 K/uL   Immature Granulocytes 1 %   Abs Immature Granulocytes 0.17 (H) 0.00 - 0.07 K/uL    Comment: Performed at [facility], [address] [place], [place] [address]  Blood gas, venous     Status: Abnormal   Collection Time: 07/15/22  9:49 PM  Result Value Ref Range   pH, Ven 7.28 7.25 - 7.43   pCO2, Ven <18 (LL) 44 - 60 mmHg    Comment: CRITICAL RESULT CALLED TO, READ BACK BY AND VERIFIED  WITH: BRENDA MORRISON,NP AT 2200 ON [id number]  RGM,RRT    Bicarbonate 7.0 (L) 20.0 - 28.0 mmol/L   Acid-base deficit 17.0 (H) 0.0 - 2.0 mmol/L   O2 Saturation 82.9 %   Patient temperature 37.0    Collection site VEIN     Comment: Performed at [facility], [address] [place], [place] [address]  Glucose, capillary     Status: None   Collection Time: 07/15/22  9:50 PM  Result Value Ref Range   Glucose-Capillary 85 70 - 99 mg/dL    Comment: Glucose reference range applies only to samples taken after fasting for at least 8 hours.  Lactic acid, plasma     Status: Abnormal   Collection Time: 07/15/22 10:22 PM  Result Value Ref Range   Lactic Acid, Venous 3.2 (HH) 0.5 - 1.9 mmol/L    Comment: CRITICAL RESULT CALLED TO, READ BACK BY AND VERIFIED WITH [facility] AT 2301 07/15/2022 DLB Performed at [facility], [address] [place], [place] [address]   Heparin level (unfractionated)     Status: Abnormal   Collection Time: 07/15/22 10:22 PM  Result Value Ref Range   Heparin Unfractionated 0.25 (L) 0.30 - 0.70 IU/mL    Comment: (NOTE) The clinical reportable range upper limit is being lowered to >1.10 to align with the FDA approved guidance for the current laboratory assay.  If heparin results are below expected values, and patient dosage has  been confirmed, suggest follow up testing of antithrombin III levels. Performed at [facility], [address] [place], [place] [address]   Blood gas, venous     Status: Abnormal   Collection Time: 07/16/22  1:54 AM  Result Value Ref Range   pH, Ven 7.44 (H) 7.25 - 7.43   pCO2, Ven 49 44 - 60 mmHg   Bicarbonate 33.3 (H) 20.0 - 28.0 mmol/L   Acid-Base Excess 7.8 (H) 0.0 - 2.0 mmol/L   O2 Saturation 49.1 %   Patient temperature 37.0    Collection site VEIN     Comment: Performed at [facility], [address] [place], [place] [address]  Lactic acid, plasma     Status: None   Collection  Time: 07/16/22  1:54 AM  Result Value Ref Range   Lactic Acid, Venous 1.4 0.5 - 1.9 mmol/L    Comment: Performed at [facility], [address] [place], [place] [address]  Culture, blood (Routine X 2) w Reflex to ID Panel     Status: None (Preliminary result)   Collection Time: 07/16/22  1:54 AM   Specimen: BLOOD LEFT ARM  Result Value Ref Range   Specimen Description BLOOD LEFT ARM    Special Requests      BOTTLES DRAWN AEROBIC AND ANAEROBIC Blood Culture adequate volume   Culture      NO GROWTH 1 DAY Performed at [facility], [address] [place], [place]  [address]    Report Status PENDING   Culture, blood (Routine X 2) w Reflex to ID Panel     Status: None (Preliminary result)   Collection Time: 07/16/22  2:00 AM   Specimen: BLOOD LEFT HAND  Result Value Ref Range   Specimen Description BLOOD LEFT HAND    Special Requests      BOTTLES DRAWN AEROBIC AND ANAEROBIC Blood Culture adequate volume   Culture      NO GROWTH 1 DAY Performed at [facility], [address], [place], [place] [address]    Report Status PENDING   Glucose, capillary     Status: Abnormal   Collection Time: 07/16/22  3:01 AM  Result Value Ref Range   Glucose-Capillary 171 (H) 70 - 99 mg/dL    Comment: Glucose reference range applies only to samples taken after fasting for at least 8 hours.  Heparin level (unfractionated)     Status: None   Collection Time: 07/16/22  5:02 AM  Result Value Ref Range   Heparin Unfractionated 0.36 0.30 - 0.70 IU/mL    Comment: (NOTE) The clinical reportable range upper limit is being lowered to >1.10 to align with the FDA approved guidance for the current laboratory assay.  If heparin results are below expected values, and patient dosage has  been confirmed, suggest follow up testing of antithrombin III levels. Performed at [facility], [address] [place], [place] [id number]   Basic metabolic panel     Status: Abnormal    Collection Time: 07/16/22  5:11 AM  Result Value Ref Range   Sodium 138 135 - 145 mmol/L   Potassium 4.3 3.5 - 5.1 mmol/L   Chloride 101 98 - 111 mmol/L   CO2 31 22 - 32 mmol/L   Glucose, Bld 222 (H) 70 - 99 mg/dL    Comment: Glucose reference range applies only to samples taken after fasting for at least 8 hours.   BUN 28 (H) 8 - 23 mg/dL   Creatinine, Ser 0.88 0.61 - 1.24 mg/dL   Calcium 7.5 (L) 8.9 - 10.3 mg/dL   GFR, Estimated >60 >60 mL/min    Comment: (NOTE) Calculated using the CKD-EPI Creatinine Equation (2021)    Anion gap 6 5 - 15    Comment: Performed at [facility], [address] [place], [place] [address]  Phosphorus     Status: None   Collection Time: 07/16/22  5:11 AM  Result Value Ref Range   Phosphorus 3.0 2.5 - 4.6 mg/dL    Comment: Performed at [facility], [address] [place], [place] [address]  Blood gas, venous     Status: Abnormal   Collection Time: 07/16/22  5:12 AM  Result Value Ref Range   pH, Ven 7.44 (H) 7.25 - 7.43   pCO2, Ven 50 44 - 60 mmHg   Bicarbonate 34.0 (H) 20.0 - 28.0 mmol/L   Acid-Base Excess 8.3 (H) 0.0 - 2.0 mmol/L   O2 Saturation 43.4 %   Patient temperature 37.0    Collection site VEIN     Comment: Performed at [facility], [address] [place], [place] [address]  Glucose, capillary     Status: Abnormal   Collection Time: 07/16/22  7:39 AM  Result Value Ref Range   Glucose-Capillary 257 (H) 70 - 99 mg/dL    Comment: Glucose reference range applies only to samples taken after fasting for at least 8 hours.  Aerobic/Anaerobic Culture w Gram Stain (  surgical/deep wound)     Status: None (Preliminary result)   Collection Time: 07/16/22 11:51 AM   Specimen: PATH Digit amputation; Tissue  Result Value Ref Range   Specimen Description      WOUND Performed at [facility], [address] [place], [place] [address]    Special Requests LEFT 3RD TOE ABSC    Gram Stain      RARE WBC  PRESENT, PREDOMINANTLY PMN GRAM POSITIVE COCCI    Culture      FEW STAPHYLOCOCCUS AUREUS CULTURE REINCUBATED FOR BETTER GROWTH Performed at [facility], [place] [address] [place], [place] [address]    Report Status PENDING   Glucose, capillary     Status: Abnormal   Collection Time: 07/16/22 12:16 PM  Result Value Ref Range   Glucose-Capillary 131 (H) 70 - 99 mg/dL    Comment: Glucose reference range applies only to samples taken after fasting for at least 8 hours.  Glucose, capillary     Status: None   Collection Time: 07/16/22  4:15 PM  Result Value Ref Range   Glucose-Capillary 97 70 - 99 mg/dL    Comment: Glucose reference range applies only to samples taken after fasting for at least 8 hours.  Glucose, capillary     Status: Abnormal   Collection Time: 07/16/22  9:40 PM  Result Value Ref Range   Glucose-Capillary 200 (H) 70 - 99 mg/dL    Comment: Glucose reference range applies only to samples taken after fasting for at least 8 hours.  Heparin level (unfractionated)     Status: Abnormal   Collection Time: 07/16/22 10:00 PM  Result Value Ref Range   Heparin Unfractionated 0.25 (L) 0.30 - 0.70 IU/mL    Comment: (NOTE) The clinical reportable range upper limit is being lowered to >1.10 to align with the FDA approved guidance for the current laboratory assay.  If heparin results are below expected values, and patient dosage has  been confirmed, suggest follow up testing of antithrombin III levels. Performed at [facility], [address] [place], [place] [address]   Glucose, capillary     Status: Abnormal   Collection Time: 07/17/22  9:09 AM  Result Value Ref Range   Glucose-Capillary 145 (H) 70 - 99 mg/dL    Comment: Glucose reference range applies only to samples taken after fasting for at least 8 hours.  Basic metabolic panel     Status: Abnormal   Collection Time: 07/17/22 10:00 AM  Result Value Ref Range   Sodium 138 135 - 145 mmol/L   Potassium  3.7 3.5 - 5.1 mmol/L   Chloride 106 98 - 111 mmol/L   CO2 23 22 - 32 mmol/L   Glucose, Bld 186 (H) 70 - 99 mg/dL    Comment: Glucose reference range applies only to samples taken after fasting for at least 8 hours.   BUN 19 8 - 23 mg/dL   Creatinine, Ser 0.86 0.61 - 1.24 mg/dL   Calcium 7.3 (L) 8.9 - 10.3 mg/dL   GFR, Estimated >60 >60 mL/min    Comment: (NOTE) Calculated using the CKD-EPI Creatinine Equation (2021)    Anion gap 9 5 - 15    Comment: Performed at [facility], [address] [place], [place] [address]  CBC     Status: Abnormal   Collection Time: 07/17/22 10:00 AM  Result Value Ref Range   WBC 11.6 (H) 4.0 - 10.5 K/uL   RBC 4.97 4.22 - 5.81  MIL/uL   Hemoglobin 14.4 13.0 - 17.0 g/dL   HCT 42.9 39.0 - 52.0 %   MCV 86.3 80.0 - 100.0 fL   MCH 29.0 26.0 - 34.0 pg   MCHC 33.6 30.0 - 36.0 g/dL   RDW 13.9 11.5 - 15.5 %   Platelets 217 150 - 400 K/uL   nRBC 0.0 0.0 - 0.2 %    Comment: Performed at [facility], [address], [place], [place] [address]  Magnesium     Status: Abnormal   Collection Time: 07/17/22 10:00 AM  Result Value Ref Range   Magnesium 1.6 (L) 1.7 - 2.4 mg/dL    Comment: Performed at [facility], [address] [place], [place] [address]  Phosphorus     Status: Abnormal   Collection Time: 07/17/22 10:00 AM  Result Value Ref Range   Phosphorus 1.9 (L) 2.5 - 4.6 mg/dL    Comment: Performed at [facility], [place], [place] [address]  Heparin level (unfractionated)     Status: Abnormal   Collection Time: 07/17/22 10:00 AM  Result Value Ref Range   Heparin Unfractionated 0.23 (L) 0.30 - 0.70 IU/mL    Comment: (NOTE) The clinical reportable range upper limit is being lowered to >1.10 to align with the FDA approved guidance for the current laboratory assay.  If heparin results are below expected values, and patient dosage has  been confirmed, suggest follow up testing of antithrombin III  levels. Performed at [facility], [address] [place],  [address]   Glucose, capillary     Status: Abnormal   Collection Time: 07/17/22 11:08 AM  Result Value Ref Range   Glucose-Capillary 218 (H) 70 - 99 mg/dL    Comment: Glucose reference range applies only to samples taken after fasting for at least 8 hours.    Current Facility-Administered Medications  Medication Dose Route Frequency Provider Last Rate Last Admin   0.9 %  sodium chloride infusion   Intravenous Continuous Val Riles, MD 75 mL/hr at 07/17/22 1200 Infusion Verify at 07/17/22 1200   0.9 %  sodium chloride infusion   Intravenous PRN Val Riles, MD   Stopped at 07/17/22 1152   acetaminophen (TYLENOL) tablet 650 mg  650 mg Oral Q6H PRN Samara Deist, DPM       Or   acetaminophen (TYLENOL) suppository 650 mg  650 mg Rectal Q6H PRN Samara Deist, DPM       atorvastatin (LIPITOR) tablet 40 mg  40 mg Oral QPM Samara Deist, DPM   40 mg at 07/16/22 1730   ceFEPIme (MAXIPIME) 2 g in sodium chloride 0.9 % 100 mL IVPB  2 g Intravenous Q12H Samara Deist, DPM   Stopped at 07/17/22 1146   Chlorhexidine Gluconate Cloth 2 % PADS 6 each  6 each Topical Daily Samara Deist, DPM   6 each at 07/17/22 1103   diltiazem (CARDIZEM CD) 24 hr capsule 240 mg  240 mg Oral Daily Samara Deist, DPM   240 mg at 07/14/22 1312   gabapentin (NEURONTIN) capsule 300 mg  300 mg Oral Daily Samara Deist, DPM   300 mg at 07/17/22 1103   hydrALAZINE (APRESOLINE) injection 10 mg  10 mg Intravenous Q6H PRN Val Riles, MD       hydrALAZINE (APRESOLINE) tablet 50 mg  50 mg Oral Q6H PRN Val Riles, MD       HYDROmorphone (DILAUDID) injection 1 mg  1 mg Intravenous Once PRN Samara Deist, DPM  insulin aspart (novoLOG) injection 0-5 Units  0-5 Units Subcutaneous QHS Samara Deist, DPM       insulin aspart (novoLOG) injection 0-9 Units  0-9 Units Subcutaneous TID WC Samara Deist, DPM   3 Units at 07/17/22 1117    lisinopril (ZESTRIL) tablet 40 mg  40 mg Oral Daily Samara Deist, DPM   40 mg at 07/17/22 1103   magnesium sulfate IVPB 4 g 100 mL  4 g Intravenous Once Val Riles, MD       metroNIDAZOLE (FLAGYL) IVPB 500 mg  500 mg Intravenous Q12H Samara Deist, DPM 100 mL/hr at 07/17/22 1200 Infusion Verify at 07/17/22 1200   morphine (PF) 2 MG/ML injection 2 mg  2 mg Intravenous Q4H PRN Samara Deist, DPM   2 mg at 07/14/22 1011   ondansetron (ZOFRAN) tablet 4 mg  4 mg Oral Q6H PRN Samara Deist, DPM       Or   ondansetron [facility]) injection 4 mg  4 mg Intravenous Q6H PRN Samara Deist, DPM       ondansetron [facility]) injection 4 mg  4 mg Intravenous Q6H PRN Samara Deist, DPM   4 mg at 07/15/22 [id number]   Oral care mouth rinse  15 mL Mouth Rinse PRN Val Riles, MD       potassium PHOSPHATE 30 mmol in dextrose 5 % 500 mL infusion  30 mmol Intravenous Once Val Riles, MD       senna-docusate (Senokot-S) tablet 1 tablet  1 tablet Oral QHS PRN Samara Deist, DPM       vancomycin (VANCOCIN) IVPB 1000 mg/200 mL premix  1,000 mg Intravenous Q24H Samara Deist, DPM   Stopped at 07/16/22 1712    Musculoskeletal: Strength & Muscle Tone:  did not assess; in ICU  Gait & Station:  in ICU, did not assess Patient leans: N/A    Psychiatric Specialty Exam:  Presentation  General Appearance: Appropriate for Environment  Eye Contact:Good  Speech:Clear and Coherent  Speech Volume:Normal  Handedness:No data recorded  Mood and Affect  Mood:-- ("good")  Affect:Appropriate   Thought Process  Thought Processes:Coherent  Descriptions of Associations:Intact  Orientation:Full (Time, Place and Person)  Thought Content:WDL  History of Schizophrenia/Schizoaffective disorder:No data recorded Duration of Psychotic Symptoms:No data recorded Hallucinations:Hallucinations: None  Ideas of Reference:-- (mild paranoia, eases with reassurance)  Suicidal Thoughts:Suicidal Thoughts: No  Homicidal  Thoughts:Homicidal Thoughts: No   Sensorium  Memory:Immediate Good; Recent Good  Judgment:Good  Insight:Fair   Executive Functions  Concentration:Good  Attention Span:Good  [place] recorded Language:Good   Psychomotor Activity  Psychomotor Activity:Psychomotor Activity: Normal   Assets  Assets:Communication Skills; Desire for Improvement; Financial Resources/Insurance; Housing; Resilience; Social Support   Sleep  Sleep:Sleep: Good   Physical Exam: Physical Exam Vitals and nursing note reviewed.  HENT:     Head: Normocephalic.     Nose: No congestion or rhinorrhea.  Pulmonary:     Effort: Pulmonary effort is normal.  Skin:    General: Skin is dry.  Neurological:     Mental Status: He is alert and oriented to person, place, and time.  Psychiatric:        Behavior: Behavior normal.        Thought Content: Thought content is paranoid (mildly).        Cognition and Memory: Cognition normal.        Judgment: Judgment normal.    Review of Systems  HENT: Negative.    Eyes:  Positive for redness.  Respiratory: Negative.    Psychiatric/Behavioral: Negative.     Blood pressure (!) 155/67, pulse 90, temperature 97.8 F (36.6 C), temperature source Oral, resp. rate 19, height 5' 7.5" (1.715 m), weight 65.5 kg, SpO2 100 %. Body mass index is 22.28 kg/m.  Treatment Plan Summary: Plan : Patient does not appear to have any psychiatric needs. Discussed keeping familiar things, pictures, within eyesight; family members available and at bedside as much as possible. Maintaining light in the daytime and dark at night. No medication recommendations. Provide reassurance to patient regarding need for all procedures.  Reviewed with Dr. Dwyane Dee.   Disposition: Patient does not meet criteria for psychiatric inpatient admission. Supportive therapy provided about ongoing stressors.  Sherlon Handing, NP 07/17/2022 12:23 PM

## 2022-07-17 NOTE — TOC Benefit Eligibility Note (Signed)
Patient Teacher, English as a foreign language completed.    The patient is currently admitted and upon discharge could be taking Eliquis 5 mg.  The current 30 day co-pay is $43.00.   The patient is insured through [place], [place] Patient Advocate Specialist [facility] Patient Advocate Team Direct Number: [phone]  Fax: [phone]

## 2022-07-17 NOTE — Progress Notes (Signed)
Daily Progress Note   Subjective  - 1 Day Post-Op  Mr. Fick presents today status post 1 day left foot incision and drainage with second toe.  Patient is kept dressings clean, dry, and intact since procedure.  Patient presents resting in bed comfortably and does have some pain to the left forefoot.  Objective Vitals:   07/17/22 1000 07/17/22 1100 07/17/22 1200 07/17/22 1300  BP: (!) 161/85 (!) 162/73 (!) 155/67 133/62  Pulse: 93 91 90 90  Resp: 16 16 19 $ (!) 23  Temp:      TempSrc:      SpO2: 100% 100% 100% 100%  Weight:      Height:        Physical Exam: Left foot appears to have had the second and third toes amputated, incision appears to be well coapted with sutures intact, does appear to have a fairly large wound to the top of the foot with tendon exposed, no obvious bone exposed, overall reduction in erythema present, still has mild to moderate edema, no odor, serous drainage, no expressible purulence today.    Laboratory CBC    Component Value Date/Time   WBC 11.6 (H) 07/17/2022 1000   HGB 14.4 07/17/2022 1000   HGB 15.5 01/22/2013 1546   HCT 42.9 07/17/2022 1000   HCT 45.4 01/22/2013 1546   PLT 217 07/17/2022 1000   PLT 203 01/22/2013 1546    BMET    Component Value Date/Time   NA 138 07/17/2022 1000   NA 137 01/22/2013 1546   K 3.7 07/17/2022 1000   K 3.9 01/22/2013 1546   CL 106 07/17/2022 1000   CL 104 01/22/2013 1546   CO2 23 07/17/2022 1000   CO2 28 01/22/2013 1546   GLUCOSE 186 (H) 07/17/2022 1000   GLUCOSE 211 (H) 01/22/2013 1546   BUN 19 07/17/2022 1000   BUN 18 01/22/2013 1546   CREATININE 0.86 07/17/2022 1000   CREATININE 1.35 (H) 01/22/2013 1546   CALCIUM 7.3 (L) 07/17/2022 1000   CALCIUM 8.9 01/22/2013 1546   GFRNONAA >60 07/17/2022 1000   GFRNONAA 48 (L) 01/22/2013 1546   GFRAA >60 01/28/2016 1259   GFRAA 56 (L) 01/22/2013 1546    Assessment/Planning: Gangrene left forefoot with severe peripheral vascular disease  -Patient seen  and examined. -Incision site appears to be well coapted with sutures intact at the area of the second toe amputation site.  Patient does appear to have a large wound present to the top of the foot with tendon exposed but appears to be fairly granular overall.  No expressible purulence present today. -Reapplied Xeroform gauze to the area followed by offloading dressing. -Patient to continue with same postop instructions per Dr. Vickki Muff.  Nonweightbearing to left lower extremity.  Likely to change to a wound VAC to the top of the foot if looking better tomorrow. -Appreciate medicine recommendations for antibiotic therapy.  Caroline More, DPM  07/17/2022, 1:33 PM

## 2022-07-17 NOTE — Progress Notes (Signed)
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   Initial vitals in the ED showed temperature of 97.6, respiration rate of 18, heart rate 76, blood pressure 165/67, SpO2 of 96% on room air.   Serum sodium is 131, potassium 4.5, chloride 90, bicarb 22, BUN of 53, serum creatinine 1.36, nonfasting blood glucose 242, EGFR 49, WBC 16.8, hemoglobin 17.1, platelets of 211.   Anion gap was elevated at 19.  Alk phos elevated at 212.   ED treatment: Cefepime, vancomycin, metronidazole.  He reports he saw Dr. Cleda Mccreedy, and was advised to come to the ED. He reports the left foot has been bothering him for over one year. He states this was a regular scheduled visit.    Assessment and Plan: Principal Problem:   Osteomyelitis ([place]) Active Problems:   Limb ischemia   Insulin dependent type 2 diabetes mellitus (HCC)   Mixed hyperlipidemia   Benign essential HTN   Stage 3a chronic kidney disease (CKD) (HCC)   Weakness   LLE peripheral artery disease Vascular surgery consulted, On 2/8 s/p angiogram, mechanical thrombectomy of left SFA pop with popliteal artery and tibial peroneal trunk. S/p tPA at the time of procedure, patient was started on continuous tPA infusion overnight, right femoral triple-lumen catheter was placed. On 2/9  Left lower extremity angiogram, Mechanical thrombectomy of the left SFA, popliteal artery, tibioperoneal trunk, and proximal posterior tibial artery with the penumbra CAT 6 device. Stent placement to  the left distal SFA and above-knee popliteal artery with 6 mm diameter by 15 cm length Viabahn stent for residual thrombus and stenosis after thrombectomy. StarClose closure device right femoral artery Patient is on heparin IV infusion Follow vascular surgery for further recommendation  # Osteomyelitis of Left third toe With possible cellulitis at the site of left foot amputation Podiatry consulted, on 2/11 s/p amputation left second toeMTP J and Excisional debridement necrotic tissue and abscess to and including tendon and muscle dorsal left foot Ankle block for postoperative anesthetic left ankle  Continue vancomycin, cefepime and Flagyl Blood cultures x 2 NGTD S/p IVF for hydration 2/11 wound culture from the OR growing few Staph aureus, follow complete report and sensitivity   Insulin dependent type 2 diabetes mellitus (HCC) - Home NovoLog 70/30, 10 units daily before breakfast not resumed on admission - Jardiance 10 mg daily, metformin 500 mg p.o. twice daily were not resumed on admission - Insulin SSI with at bedtime coverage ordered - Goal inpatient blood glucose levels 140-180   Mixed hyperlipidemia - Atorvastatin 40 mg daily   Benign essential HTN - Diltiazem 240 mg daily, lisinopril 40 mg daily resumed - Hydralazine 5 mg IV every 8 hours as needed for SBP greater than 175, 4 days ordered   Weakness Patient need on the floor for 1 day due to extreme weakness, per daughter - CK 66 wnl, B12 level 563 wnl, TSH 3.17 wnl - PT and OT eval done, recommend SNF placement   Stage 3a chronic kidney disease (CKD) (HCC) - Serum creatinine elevation, does not meet criteria for acute  kidney injury, presumed secondary to poor p.o. intake Serum creatinine ranges 1.03-1.29-->0.88 S/p IVF  - Check BMP in a.m.  Isotonic hyponatremia most likely due to nutritional deficiency Serum osmolality 289, Sodium 132--138 Resolved   Hypomagnesemia, mag repleted. Monitor and replete as  needed.  Hypophosphatemia, Phos repleted.   monitor and replete as needed.  Body mass index is 21.58 kg/m.  Interventions:       Diet: Diabetic carb modified diet DVT Prophylaxis: Subcutaneous Heparin    Advance goals of care discussion: Full code  Family Communication: family was not present at bedside, at the time of interview.  The pt provided permission to discuss medical plan with the family. Opportunity was given to ask question and all questions were answered satisfactorily.   Disposition:  Pt is from Home, admitted with left toe osteomyelitis, vascular surgery and podiatry consulted, s/p LLE angiogram, thrombectomy.  S/p amputation left 2nd toe, which precludes a safe discharge. Discharge to SNF as per  PT/OT eval, when medically stable and cleared by podiatry and vascular surgery..  Subjective: No significant events overnight, patient stated pain is very mild, but he was scared and paranoid as per RN, had refused lab draws.  Patient's family arrived and then he was feeling secure.  Psych was consulted.    Physical Exam: General: NAD, lying comfortably Appear in no distress, affect appropriate Eyes: PERRLA ENT: Oral Mucosa Clear, moist  Neck: no JVD,  Cardiovascular: S1 and S2 Present, no Murmur,  Respiratory: good respiratory effort, Bilateral Air entry equal and Decreased, no Crackles, no wheezes Abdomen: Bowel Sound present, Soft and no tenderness,  Skin: no rashes Extremities: no Pedal edema, no calf tenderness.  Left foot dressing CDI Neurologic: without any new focal findings Gait not checked due to patient safety concerns  Vitals:   07/17/22 0800 07/17/22 0830 07/17/22 0900 07/17/22 1000  BP: (!) 159/73  (!) 168/79 (!) 161/85  Pulse: 82 80 90 93  Resp: 18 13 18 16  $ Temp:  97.8 F (36.6 C)    TempSrc:  Oral    SpO2: 99% 98% 99% 100%  Weight:      Height:        Intake/Output Summary (Last 24 hours) at 07/17/2022 1108 Last data filed at 07/17/2022  0900 Gross per 24 hour  Intake 4040.44 ml  Output 1203 ml  Net 2837.44 ml   Filed Weights   07/12/22 1104 07/13/22 1930 07/15/22 0000  Weight: 62.5 kg 55 kg 65.5 kg    Data Reviewed: I have personally reviewed and interpreted daily labs, tele strips, imagings as discussed above. I reviewed all nursing notes, pharmacy notes, vitals, pertinent old records I have discussed plan of care as described above with RN and patient/family.  CBC: Recent Labs  Lab 07/12/22 1130 07/13/22 0504 07/13/22 1822 07/14/22 0510 07/15/22 2136 07/17/22 1000  WBC 16.8* 11.1* 12.7* 10.5 14.1* 11.6*  NEUTROABS 13.8*  --   --   --  10.5*  --   HGB 17.1* 14.4 13.9 13.4 12.2* 14.4  HCT 51.7 42.8 42.1 40.9 36.4* 42.9  MCV 87.9 87.2 87.9 89.1 87.7 86.3  PLT 211 166 188 152 148* [id number]   Basic Metabolic Panel: Recent Labs  Lab 07/13/22 1010 07/14/22 0510 07/15/22 0542 07/15/22 2135 07/15/22 2136 07/16/22 0511 07/17/22 1000  NA  --  135 138 133*  --  138 138  K  --  3.7 4.3 3.4*  --  4.3 3.7  CL  --  105 105 106  --  101 106  CO2  --  21* 20* 22  --  31 23  GLUCOSE  --  146* 177* 133*  --  222* 186*  BUN  --  30* 28* 31*  --  28* 19  CREATININE  --  0.98 0.99 0.98  --  0.88 0.86  CALCIUM  --  7.7* 8.1* 7.5*  --  7.5* 7.3*  MG 1.7 1.6* 2.0  --  1.9  --  1.6*  PHOS 2.3* 3.0 3.2  --  1.5* 3.0 1.9*    Studies: No results found.  Scheduled Meds:  atorvastatin  40 mg Oral QPM   Chlorhexidine Gluconate Cloth  6 each Topical Daily   diltiazem  240 mg Oral Daily   gabapentin  300 mg Oral Daily   insulin aspart  0-5 Units Subcutaneous QHS   insulin aspart  0-9 Units Subcutaneous TID WC   lisinopril  40 mg Oral Daily   Continuous Infusions:  sodium chloride 75 mL/hr at 07/17/22 1006   sodium chloride 10 mL/hr at 07/17/22 0900   ceFEPime (MAXIPIME) IV Stopped (07/16/22 2235)   metronidazole Stopped (07/17/22 0015)   vancomycin Stopped (07/16/22 1712)   PRN Meds: sodium chloride, acetaminophen  **OR** acetaminophen, hydrALAZINE, hydrALAZINE, HYDROmorphone (DILAUDID) injection, morphine injection, ondansetron **OR** ondansetron (ZOFRAN) IV, ondansetron (ZOFRAN) IV, senna-docusate  Time spent: 50 minutes  Author: Val Riles. MD [facility] 07/17/2022 11:08 AM  To reach On-call, see care teams to locate the attending and reach out to them via [email or web address]. If 7PM-7AM, please contact night-coverage If you still have difficulty reaching the attending provider, please page the [facility] (Director on Call) for [facility] on amion for assistance.

## 2022-07-17 NOTE — Progress Notes (Signed)
Pt currently refusing blood draws, medications, and turning. Pt endorses generalized paranoia which has increased since about 0000. Pt states "Well, I just don't know why but I feel I can't trust anyone here and someone is trying to do me harm. I don't know why but I just do." Pt is otherwise A&O x4. Pt's daughter contacted and allowed to speak with pt in effort to reason with him. Pt continues to refuse despite education and reenforcement from daughter.  RN will continue efforts and monitor pt condition.

## 2022-07-17 NOTE — Evaluation (Signed)
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   Clinical Impression  Pt was pleasant and motivated to participate during the session and put forth good effort throughout. Pt required extra time and cuing to process and follow commands and required extensive physical assistance with all functional tasks.  Once in sitting at the EOB pt presented with minor posterior instability needing occasional min A to correct.  In standing pt presented with significant posterior instability and required constant physical assistance to prevent LOB.  Pt was unable to advance either LE while in standing despite multiple attempts and heavy cuing.  Pt will benefit from PT services in a SNF setting upon discharge to safely address deficits listed in patient problem list for decreased caregiver assistance and eventual return to PLOF.         Recommendations for follow up therapy are one component of a multi-disciplinary discharge planning process, led by the attending physician.  Recommendations may be updated based on patient status, additional functional criteria and insurance authorization.  Follow Up Recommendations Skilled nursing-short term rehab (<3 hours/day) Can patient physically be transported by private vehicle: No    Assistance Recommended at Discharge Frequent or constant Supervision/Assistance  Patient can  return home with the following  A lot of help with bathing/dressing/bathroom;Assistance with cooking/housework;Direct supervision/assist for medications management;Assist for transportation;Help with stairs or ramp for entrance;Direct supervision/assist for financial management;Two people to help with walking and/or transfers    Equipment Recommendations Other (comment) (TBD at next venue of care)  Recommendations for Other Services       Functional Status Assessment Patient has had a recent decline in their functional status and/or demonstrates limited ability to make significant improvements in function in a reasonable and predictable amount of time     Precautions / Restrictions Precautions Precautions: Fall Restrictions Weight Bearing Restrictions: Yes Other Position/Activity Restrictions: L heel WB in post op shoe per Dr. Vickki Muff      Mobility  Bed Mobility Overal bed mobility: Needs Assistance Bed Mobility: Supine to Sit, Sit to Supine     Supine to sit: HOB elevated, Mod assist, Max assist Sit to supine: Max assist, Mod assist   General bed mobility comments: Mod to max A for BLE and trunk control with verbal cueing for sequencing    Transfers Overall transfer level: Needs assistance Equipment used: Rolling walker (2 wheels) Transfers: Sit to/from Stand Sit to Stand: +2 physical assistance, Mod assist           General transfer comment: +2 mod A to come to standing and to prevent posterior LOB while in standing    Ambulation/Gait               General Gait Details: Pt unable to advance either LE while in standing  Stairs            Wheelchair Mobility    Modified Rankin (Stroke Patients Only)  Balance Overall balance assessment: Needs assistance Sitting-balance support: Feet supported Sitting balance-Leahy Scale: Poor Sitting balance - Comments: occasional min A to prevent posterior LOB in sitting Postural control: Posterior  lean Standing balance support: Bilateral upper extremity supported, Reliant on assistive device for balance Standing balance-Leahy Scale: Zero Standing balance comment: Constant assist while in standing to prevent posterior LOB                             Pertinent Vitals/Pain Pain Assessment Pain Assessment: No/denies pain    Home Living Family/patient expects to be discharged to:: Private residence Living Arrangements: Alone Available Help at Discharge: Family;Friend(s);Available PRN/intermittently Type of Home: Apartment Home Access: Level entry       Home Layout: One level Home Equipment: Rollator (4 wheels);Rolling Walker (2 wheels) Additional Comments: Pt with difficulty providing history, information obtained both from patient as well as chart review    Prior Function Prior Level of Function : Independent/Modified Independent             Mobility Comments: Pt stated that he owns a rollator and a RW and has been primarily using the RW recently ADLs Comments: Ind with ADLs, assist from family with IADLs     Hand Dominance        Extremity/Trunk Assessment   Upper Extremity Assessment Upper Extremity Assessment: Generalized weakness    Lower Extremity Assessment Lower Extremity Assessment: Generalized weakness       Communication   Communication: No difficulties  Cognition Arousal/Alertness: Lethargic Behavior During Therapy: WFL for tasks assessed/performed Overall Cognitive Status: Impaired/Different from baseline Area of Impairment: Safety/judgement, Awareness                       Following Commands: Follows one step commands inconsistently Safety/Judgement: Decreased awareness of safety              General Comments      Exercises Total Joint Exercises Ankle Circles/Pumps: AROM, Both, 10 reps (minimal amplitude on the LLE) Quad Sets: Strengthening, Both, 10 reps, 5 reps Short Arc Quad: AAROM, Strengthening, 10 reps,  AROM, Left Hip ABduction/ADduction: AAROM, Strengthening, Left, 10 reps Straight Leg Raises: AAROM, Strengthening, Left, 10 reps Other Exercises Other Exercises: Static sitting at the EOB x 5 min for improved core strength and activity tolerance   Assessment/Plan    PT Assessment Patient needs continued PT services  PT Problem List Decreased strength;Decreased activity tolerance;Decreased balance;Decreased mobility;Decreased knowledge of use of DME;Decreased safety awareness;Decreased knowledge of precautions       PT Treatment Interventions DME instruction;Gait training;Functional mobility training;Therapeutic activities;Therapeutic exercise;Balance training;Patient/family education    PT Goals (Current goals can be found in the Care Plan section)  Acute Rehab PT Goals Patient Stated Goal: To get stronger PT Goal Formulation: With patient Time For Goal Achievement: 07/30/22 Potential to Achieve Goals: Good    Frequency 7X/week     Co-evaluation               AM-PAC PT "6 Clicks" Mobility  Outcome Measure Help needed turning from your back to your side while in a flat bed without using bedrails?: A Lot Help needed moving from lying on your back to sitting on the side of a flat bed without using bedrails?: Total Help needed moving to and from a bed to a chair (including a wheelchair)?: Total Help needed standing up from a chair using your arms (e.g., wheelchair or bedside  chair)?: Total Help needed to walk in hospital room?: Total Help needed climbing 3-5 steps with a railing? : Total 6 Click Score: 7    End of Session Equipment Utilized During Treatment: Gait belt Activity Tolerance: Patient tolerated treatment well Patient left: in bed;with call bell/phone within reach;with nursing/sitter in room;with family/visitor present Nurse Communication: Mobility status PT Visit Diagnosis: Difficulty in walking, not elsewhere classified (R26.2);Muscle weakness (generalized)  (M62.81)    Time: [id number] PT Time Calculation (min) (ACUTE ONLY): 27 min   Charges:   PT Evaluation $PT Re-evaluation: 1 Re-eval PT Treatments $Therapeutic Exercise: 8-22 mins       D. Royetta Asal PT, DPT 07/17/22, 10:57 AM

## 2022-07-17 NOTE — Progress Notes (Signed)
Patient states he can see people walking fast in the hallway and talking and it makes him feel scared. He does not know what is going on in the hallway and who all the people are. Patient is tearful with family at bedside and said he feels safer with them here. Patient educated on why there are other people in the hallway. Family requested to keep door shut and curtain closed while they are present.

## 2022-07-18 DIAGNOSIS — E1169 Type 2 diabetes mellitus with other specified complication: Secondary | ICD-10-CM | POA: Diagnosis not present

## 2022-07-18 DIAGNOSIS — Z794 Long term (current) use of insulin: Secondary | ICD-10-CM

## 2022-07-18 DIAGNOSIS — M8618 Other acute osteomyelitis, other site: Secondary | ICD-10-CM | POA: Diagnosis not present

## 2022-07-18 DIAGNOSIS — E1152 Type 2 diabetes mellitus with diabetic peripheral angiopathy with gangrene: Secondary | ICD-10-CM

## 2022-07-18 DIAGNOSIS — F22 Delusional disorders: Secondary | ICD-10-CM | POA: Diagnosis not present

## 2022-07-18 LAB — CBC
HCT: 41.3 % (ref 39.0–52.0)
Hemoglobin: 13.6 g/dL (ref 13.0–17.0)
MCH: 28.8 pg (ref 26.0–34.0)
MCHC: 32.9 g/dL (ref 30.0–36.0)
MCV: 87.3 fL (ref 80.0–100.0)
Platelets: 204 10*3/uL (ref 150–400)
RBC: 4.73 MIL/uL (ref 4.22–5.81)
RDW: 14.1 % (ref 11.5–15.5)
WBC: 7.3 10*3/uL (ref 4.0–10.5)
nRBC: 0 % (ref 0.0–0.2)

## 2022-07-18 LAB — BASIC METABOLIC PANEL
Anion gap: 10 (ref 5–15)
BUN: 15 mg/dL (ref 8–23)
CO2: 24 mmol/L (ref 22–32)
Calcium: 7.3 mg/dL — ABNORMAL LOW (ref 8.9–10.3)
Chloride: 103 mmol/L (ref 98–111)
Creatinine, Ser: 0.79 mg/dL (ref 0.61–1.24)
GFR, Estimated: >60 mL/min (ref >60)
Glucose, Bld: 155 mg/dL — ABNORMAL HIGH (ref 70–99)
Potassium: 3.6 mmol/L (ref 3.5–5.1)
Sodium: 137 mmol/L (ref 135–145)

## 2022-07-18 LAB — GLUCOSE, CAPILLARY
Glucose-Capillary: 145 mg/dL — ABNORMAL HIGH (ref 70–99)
Glucose-Capillary: 186 mg/dL — ABNORMAL HIGH (ref 70–99)
Glucose-Capillary: 235 mg/dL — ABNORMAL HIGH (ref 70–99)
Glucose-Capillary: 199 mg/dL — ABNORMAL HIGH (ref 70–99)

## 2022-07-18 LAB — SURGICAL PATHOLOGY

## 2022-07-18 LAB — PHOSPHORUS: Phosphorus: 2.2 mg/dL — ABNORMAL LOW (ref 2.5–4.6)

## 2022-07-18 LAB — MAGNESIUM: Magnesium: 1.8 mg/dL (ref 1.7–2.4)

## 2022-07-18 MED ORDER — K PHOS MONO-SOD PHOS DI & MONO 155-852-130 MG PO TABS
500.0000 mg | ORAL_TABLET | Freq: Three times a day (TID) | ORAL | Status: AC
  Administered 2022-07-18 – 2022-07-19 (×6): 500 mg via ORAL
  Filled 2022-07-18 (×7): qty 2

## 2022-07-18 MED ORDER — LINEZOLID 600 MG PO TABS
600.0000 mg | ORAL_TABLET | Freq: Two times a day (BID) | ORAL | Status: AC
  Administered 2022-07-19 – 2022-07-20 (×4): 600 mg via ORAL
  Filled 2022-07-18 (×4): qty 1

## 2022-07-18 NOTE — Progress Notes (Signed)
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   OT comments  Upon entering the room, pt supine in bed and agreeable to OT intervention. Pt is tearful throughout session and emotionally labile requiring cues to redirect throughout. Pt asking, " Why would they do this to me?" . OT oriented pt to situation and pt again becomes tearful. Mod A for supine >sit with pt needing min A for static sitting balance. Pt declined attempts to stand and to transfer this session and began leaning back towards [facility] and requesting to return to supine. Max A for sit >supine and repositioning in bed. All needs within reach.    Recommendations for follow up therapy are one component of a multi-disciplinary discharge planning process, led by the attending physician.  Recommendations may be updated based on patient status, additional functional criteria and insurance authorization.    Follow Up Recommendations  Skilled nursing-short term rehab (<3 hours/day)     Assistance Recommended at Discharge Frequent or constant Supervision/Assistance  Patient can return home with the following  A lot of help with walking and/or transfers;A lot of help with bathing/dressing/bathroom   Equipment Recommendations  Other (comment) (defer to next venue of care)       Precautions / Restrictions Precautions Precautions:  Fall Restrictions Weight Bearing Restrictions: Yes LLE Weight Bearing: Non weight bearing       Mobility Bed Mobility Overal bed mobility: Needs Assistance Bed Mobility: Supine to Sit, Sit to Supine     Supine to sit: HOB elevated, Mod assist Sit to supine: Max assist   General bed mobility comments: assist with trunk support and B LEs    Transfers                   General transfer comment: pt refusing attempts     Balance Overall balance assessment: Needs assistance Sitting-balance support: Feet supported Sitting balance-Leahy Scale: Poor Sitting balance - Comments: occasional min A to prevent posterior LOB in sitting                                   ADL either performed or assessed with clinical judgement   ADL Overall ADL's : Needs assistance/impaired     Grooming: Wash/dry hands;Wash/dry face;Sitting;Set up;Supervision/safety                                      Extremity/Trunk Assessment Upper Extremity Assessment Upper Extremity Assessment: Generalized weakness   Lower Extremity Assessment Lower Extremity Assessment: Generalized weakness        Vision Patient Visual Report: No change from baseline            Cognition Arousal/Alertness: Awake/alert Behavior During Therapy: Anxious (tearful) Overall Cognitive Status: Impaired/Different from baseline Area of Impairment: Safety/judgement, Awareness  Orientation Level: Disoriented to, Time, Situation   Memory: Decreased short-term memory Following Commands: Follows one step commands inconsistently Safety/Judgement: Decreased awareness of safety Awareness: Emergent Problem Solving: Slow processing, Requires verbal cues, Requires tactile cues                     Pertinent Vitals/ Pain       Pain Assessment Pain Assessment: Faces Faces Pain Scale: Hurts a little bit Pain Location: ear Pain Descriptors / Indicators:  Discomfort Pain Intervention(s): Repositioned, Monitored during session         Frequency  Min 2X/week        Progress Toward Goals  OT Goals(current goals can now be found in the care plan section)     Acute Rehab OT Goals Patient Stated Goal: to get better OT Goal Formulation: With patient Time For Goal Achievement: 07/29/22 Potential to Achieve Goals: Good  Plan         AM-PAC OT "6 Clicks" Daily Activity     Outcome Measure   Help from another person eating meals?: None Help from another person taking care of personal grooming?: A Little Help from another person toileting, which includes using toliet, bedpan, or urinal?: A Lot Help from another person bathing (including washing, rinsing, drying)?: A Lot Help from another person to put on and taking off regular upper body clothing?: A Little Help from another person to put on and taking off regular lower body clothing?: A Lot 6 Click Score: 16    End of Session    OT Visit Diagnosis: Unsteadiness on feet (R26.81);Other abnormalities of gait and mobility (R26.89);History of falling (Z91.81);Muscle weakness (generalized) (M62.81)   Activity Tolerance Other (comment) (pt is emotionally labile during session)   Patient Left in bed;with call bell/phone within reach   Nurse Communication Mobility status        Time: [id number] OT Time Calculation (min): 22 min  Charges: OT General Charges $OT Visit: 1 Visit OT Treatments $Therapeutic Activity: 8-22 mins  Darleen Crocker, MS, OTR/L , CBIS ascom [phone]  07/18/22, 1:21 PM

## 2022-07-18 NOTE — Consult Note (Addendum)
WOC Nurse Consult Note: Reason for Consult:NPWT to left anterior foot Wound type: surgical Pressure Injury POA: N/A Measurement:6.5cm x 2.5cm x 0.2cm  Wound bed:90% red, 10% exposed tendon Drainage (amount, consistency, odor) small serous Periwound: macerated Dressing procedure/placement/frequency: Dressing removed and wound cleansed with NS and patted dry. Small piece of xeroform gauze placed over exposed tendon. Folded gauze 2x2 placed between 4th and 5th digit. Skin barrier ring placed around wound and around base of great toe. One (1) piece of black foam placed over wound, secured with drape. Dressing attached to 158mHg continuous negative pressure and an immediate seal is achieved. Patient tolerated procedure well. Gauze Kerlix roll dressing applied to foot to guard against medical device pressure related skin injury from tubing and this is covered with an ACE bandage. A Prevalon boot is provided for floatation of heel and prevention of PI.  I have communicated with Dr. FVickki Muffthis evening and his preference is to leave this dressing in place until Friday, then begin a M/W/F dressing change schedule. WOC Nursing to contact Dr. FVickki Muffif they anticipate an obstacle to that schedule.  [facility] will follow, and will remain available to this patient, the nursing and medical teams.    Thank you for inviting [facility] participate in this patient's Plan of Care.  LMaudie Flakes MSN, RN, CNS, [place] CSerita Grammes [employer]phone:  [phone]

## 2022-07-18 NOTE — Progress Notes (Signed)
Progress Note    07/18/2022 4:12 PM 2 Days Post-Op  Subjective:   POD #4 status post mechanical thrombectomy of the left SFA, popliteal and tibial vessels with stent placement of the SFA and above-knee popliteal artery. Toes and foot remain painful.  His leg feels better. Leg less red today. No other complaints overnight. Vitals all remain stable.  He is also POD #2 by Podiatry from amputation left second toe with I&D of dorsal left foot.  Intraoperatively large abscess was noted on the dorsal foot with a large area of necrotic tissue.  Excision of this material and the area was flushed.  Dorsal wound was left open and covered with nonadherent bandage.  Amputation site was closed distally.    Vitals:   07/18/22 1200 07/18/22 1300  BP: (!) 165/102 (!) 148/64  Pulse: 90 88  Resp: 16 17  Temp: 98 F (36.7 C)   SpO2: 97% 97%   Physical Exam: Cardiac:  RRR Lungs:  Normal respiratory effort. Diminished in the bases. No wheezing or crackles noted.   Incisions:  Right groin dressing is clean dry and intact.  Extremities:  Left lower extremity warm to touch. Cellulitis is improving  Abdomen:   Positive bowel sounds, soft, flat, NT/ND  Neurologic: A&OX3 Neuro Intact.   CBC    Component Value Date/Time   WBC 7.3 07/18/2022 0534   RBC 4.73 07/18/2022 0534   HGB 13.6 07/18/2022 0534   HGB 15.5 01/22/2013 1546   HCT 41.3 07/18/2022 0534   HCT 45.4 01/22/2013 1546   PLT 204 07/18/2022 0534   PLT 203 01/22/2013 1546   MCV 87.3 07/18/2022 0534   MCV 88 01/22/2013 1546   MCH 28.8 07/18/2022 0534   MCHC 32.9 07/18/2022 0534   RDW 14.1 07/18/2022 0534   RDW 13.9 01/22/2013 1546   LYMPHSABS 1.7 07/15/2022 2136   MONOABS 1.7 (H) 07/15/2022 2136   EOSABS 0.0 07/15/2022 2136   BASOSABS 0.1 07/15/2022 2136    BMET    Component Value Date/Time   NA 137 07/18/2022 0534   NA 137 01/22/2013 1546   K 3.6 07/18/2022 0534   K 3.9 01/22/2013 1546   CL 103 07/18/2022 0534   CL 104  01/22/2013 1546   CO2 24 07/18/2022 0534   CO2 28 01/22/2013 1546   GLUCOSE 155 (H) 07/18/2022 0534   GLUCOSE 211 (H) 01/22/2013 1546   BUN 15 07/18/2022 0534   BUN 18 01/22/2013 1546   CREATININE 0.79 07/18/2022 0534   CREATININE 1.35 (H) 01/22/2013 1546   CALCIUM 7.3 (L) 07/18/2022 0534   CALCIUM 8.9 01/22/2013 1546   GFRNONAA >60 07/18/2022 0534   GFRNONAA 48 (L) 01/22/2013 1546   GFRAA >60 01/28/2016 1259   GFRAA 56 (L) 01/22/2013 1546    INR    Component Value Date/Time   INR 1.1 07/12/2022 1201     Intake/Output Summary (Last 24 hours) at 07/18/2022 1612 Last data filed at 07/18/2022 0600 Gross per 24 hour  Intake 1792.8 ml  Output 600 ml  Net 1192.8 ml     Assessment/Plan:  89 y.o. male is POD #4 from  mechanical thrombectomy of the left SFA, popliteal and tibial vessels with stent placement of the SFA and above-knee popliteal artery  2 Days Post-Op He is also POD #1 today from toe amputation.   PLAN: Patient is recovering and healing as expected.  He has excellent blood flow to the ankle.  No further interventions planned at this time.  Okay  to discharge to home and follow up in one month with duplex ultrasound and ABI's Patient to go home on ASA 81 mg Daily and Eliquis 2.5 mg BID   DVT prophylaxis:  ASA 81 mg and Eliquis 2.5 mg BID   Kimmberly Wisser R Shakiara Lukic [facility] 07/18/2022 4:12 PM

## 2022-07-18 NOTE — Progress Notes (Signed)
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   Initial vitals in the ED showed temperature of 97.6, respiration rate of 18, heart rate 76, blood pressure 165/67, SpO2 of 96% on room air.   Serum sodium is 131, potassium 4.5, chloride 90, bicarb 22, BUN of 53, serum creatinine 1.36, nonfasting blood glucose 242, EGFR 49, WBC 16.8, hemoglobin 17.1, platelets of 211.   Anion gap was elevated at 19.  Alk phos elevated at 212.   ED treatment: Cefepime, vancomycin, metronidazole.  He reports he saw Dr. Cleda Mccreedy, and was advised to come to the ED. He reports the left foot has been bothering him for over one year. He states this was a regular scheduled visit.    Assessment and Plan: Principal Problem:   Osteomyelitis ([place]) Active Problems:   Limb ischemia   Insulin dependent type 2 diabetes mellitus (HCC)   Mixed hyperlipidemia   Benign essential HTN   Stage 3a chronic kidney disease (CKD) (HCC)   Weakness   LLE peripheral artery disease Vascular surgery consulted, On 2/8 s/p angiogram, mechanical thrombectomy of left SFA pop with popliteal artery and tibial peroneal trunk. S/p tPA at the time of procedure, patient was started on continuous tPA infusion overnight, right femoral triple-lumen catheter was placed. On 2/9  Left lower extremity angiogram, Mechanical thrombectomy of the left SFA, popliteal artery, tibioperoneal trunk, and proximal posterior tibial artery with the penumbra CAT 6 device. Stent placement to  the left distal SFA and above-knee popliteal artery with 6 mm diameter by 15 cm length Viabahn stent for residual thrombus and stenosis after thrombectomy. StarClose closure device right femoral artery Patient is on heparin IV infusion Follow vascular surgery for further recommendation  # Osteomyelitis of Left third toe With possible cellulitis at the site of left foot amputation Podiatry consulted, on 2/11 s/p amputation left second toeMTP J and Excisional debridement necrotic tissue and abscess to and including tendon and muscle dorsal left foot Ankle block for postoperative anesthetic left ankle  S/p cefepime and Flagyl, discontinued on 2/12 due to possible toxicity as patient was having paranoid thoughts and delirium Continue vancomycin, and started Unasyn Blood cultures x 2 NGTD S/p IVF for hydration 2/11 wound culture from the OR growing MRSA We will consult ID for further recommendation of antibiotics   Insulin dependent type 2 diabetes mellitus (HCC) - Home NovoLog 70/30, 10 units daily before breakfast not resumed on admission - Jardiance 10 mg daily, metformin 500 mg p.o. twice daily were not resumed on admission - Insulin SSI with at bedtime coverage ordered - Goal inpatient blood glucose levels 140-180   Mixed hyperlipidemia - Atorvastatin 40 mg daily   Benign essential HTN - Diltiazem 240 mg daily, lisinopril 40 mg daily resumed - Hydralazine 5 mg IV every 8 hours as needed for SBP greater than 175, 4 days ordered   Weakness Patient need on the floor for 1 day due to extreme weakness, per daughter - CK 66 wnl, B12 level 563 wnl, TSH 3.17 wnl - PT and OT eval done, recommend  SNF placement   Stage 3a chronic kidney disease (CKD) (HCC) - Serum creatinine elevation, does not meet criteria for acute kidney injury, presumed secondary to poor p.o. intake Serum creatinine ranges 1.03-1.29-->0.88 S/p IVF  - Check BMP in a.m.  Isotonic hyponatremia most likely due to  nutritional deficiency Serum osmolality 289, Sodium 132--138 Resolved   Hypomagnesemia, mag repleted. Monitor and replete as needed.  Hypophosphatemia, Phos repleted.   monitor and replete as needed.  Body mass index is 21.58 kg/m.  Interventions:       Diet: Diabetic carb modified diet DVT Prophylaxis: Subcutaneous Heparin    Advance goals of care discussion: Full code  Family Communication: family was present at bedside, at the time of interview.  The pt provided permission to discuss medical plan with the family. Opportunity was given to ask question and all questions were answered satisfactorily.   Disposition:  Pt is from Home, admitted with left toe osteomyelitis, vascular surgery and podiatry consulted, s/p LLE angiogram, thrombectomy.  S/p amputation left 2nd toe, which precludes a safe discharge. Discharge to SNF as per  PT/OT eval, when medically stable and cleared by podiatry and vascular surgery..  Subjective: No significant events overnight, patient stated that he is feeling better, pain is under control.  Denied any chest pain or palpitation, no shortness of breath.    Physical Exam: General: NAD, lying comfortably Appear in no distress, affect appropriate Eyes: PERRLA ENT: Oral Mucosa Clear, moist  Neck: no JVD,  Cardiovascular: S1 and S2 Present, no Murmur,  Respiratory: good respiratory effort, Bilateral Air entry equal and Decreased, no Crackles, no wheezes Abdomen: Bowel Sound present, Soft and no tenderness,  Skin: no rashes Extremities: no Pedal edema, no calf tenderness.  Left foot dressing CDI Neurologic: without any new focal findings Gait not checked due to patient safety concerns  Vitals:   07/18/22 1000 07/18/22 1100 07/18/22 1200 07/18/22 1300  BP: (!) 149/60 (!) 157/71 (!) 165/102 (!) 148/64  Pulse: 92 92 90 88  Resp: 17 (!) 21 16 17  $ Temp:   98 F (36.7 C)   TempSrc:   Oral   SpO2: 98% 99% 97% 97%  Weight:      Height:         Intake/Output Summary (Last 24 hours) at 07/18/2022 1355 Last data filed at 07/18/2022 0600 Gross per 24 hour  Intake 2477.22 ml  Output 600 ml  Net 1877.22 ml   Filed Weights   07/12/22 1104 07/13/22 1930 07/15/22 0000  Weight: 62.5 kg 55 kg 65.5 kg    Data Reviewed: I have personally reviewed and interpreted daily labs, tele strips, imagings as discussed above. I reviewed all nursing notes, pharmacy notes, vitals, pertinent old records I have discussed plan of care as described above with RN and patient/family.  CBC: Recent Labs  Lab 07/12/22 1130 07/13/22 0504 07/13/22 1822 07/14/22 0510 07/15/22 2136 07/17/22 1000 07/18/22 0534  WBC 16.8*   < > 12.7* 10.5 14.1* 11.6* 7.3  NEUTROABS 13.8*  --   --   --  10.5*  --   --   HGB 17.1*   < > 13.9 13.4 12.2* 14.4 13.6  HCT 51.7   < > 42.1 40.9 36.4* 42.9 41.3  MCV 87.9   < > 87.9 89.1 87.7 86.3 87.3  PLT 211   < > 188 152 148* 217 204   < > = values in this interval not displayed.   Basic Metabolic Panel: Recent Labs  Lab 07/14/22  0510 07/15/22 0542 07/15/22 2135 07/15/22 2136 07/16/22 0511 07/17/22 1000 07/18/22 0534  NA 135 138 133*  --  138 138 137  K 3.7 4.3 3.4*  --  4.3 3.7 3.6  CL 105 105 106  --  101 106 103  CO2 21* 20* 22  --  31 23 24  $ GLUCOSE 146* 177* 133*  --  222* 186* 155*  BUN 30* 28* 31*  --  28* 19 15  CREATININE 0.98 0.99 0.98  --  0.88 0.86 0.79  CALCIUM 7.7* 8.1* 7.5*  --  7.5* 7.3* 7.3*  MG 1.6* 2.0  --  1.9  --  1.6* 1.8  PHOS 3.0 3.2  --  1.5* 3.0 1.9* 2.2*    Studies: No results found.  Scheduled Meds:  apixaban  2.5 mg Oral BID   aspirin EC  81 mg Oral Daily   atorvastatin  40 mg Oral QPM   Chlorhexidine Gluconate Cloth  6 each Topical Daily   diltiazem  240 mg Oral Daily   gabapentin  300 mg Oral Daily   insulin aspart  0-5 Units Subcutaneous QHS   insulin aspart  0-9 Units Subcutaneous TID WC   lisinopril  40 mg Oral Daily   phosphorus  500 mg Oral TID   Continuous  Infusions:  sodium chloride 75 mL/hr at 07/18/22 0600   sodium chloride Stopped (07/17/22 1621)   ampicillin-sulbactam (UNASYN) IV 3 g (07/18/22 0926)   vancomycin 1,000 mg (07/18/22 1323)   PRN Meds: sodium chloride, hydrALAZINE, hydrALAZINE, HYDROmorphone (DILAUDID) injection, morphine injection, ondansetron (ZOFRAN) IV, mouth rinse, senna-docusate  Time spent: 40 minutes  Author: Val Riles. MD [facility] 07/18/2022 1:55 PM  To reach On-call, see care teams to locate the attending and reach out to them via [email or web address]. If 7PM-7AM, please contact night-coverage If you still have difficulty reaching the attending provider, please page the [facility] (Director on Call) for [facility] on amion for assistance.

## 2022-07-18 NOTE — Progress Notes (Signed)
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    PT Comments    Pt was pleasant and motivated to participate during the session and put forth good effort throughout. Pt required mod to max A with bed mobility tasks and once in sitting required occasional min A to prevent posterior LOB.  Static sitting balance grossly improved during the session after anterior weight shifting activities.  Pt unable to clear the surface of the bed during transfer attempts with max cuing for hand placement and for LLE NWB compliance.  Pt will benefit from PT services in a SNF setting upon discharge to safely address deficits listed in patient problem list for decreased caregiver assistance and eventual return to PLOF.     Recommendations for follow up therapy are one component of a multi-disciplinary discharge planning process, led by the attending physician.  Recommendations may be updated based on patient status, additional functional criteria and insurance authorization.  Follow Up Recommendations  Skilled nursing-short term rehab (<3 hours/day) Can patient physically be transported by private vehicle: No   Assistance Recommended at Discharge Frequent or constant Supervision/Assistance  Patient can return home with the following A lot of help with bathing/dressing/bathroom;Assistance with cooking/housework;Direct  supervision/assist for medications management;Assist for transportation;Help with stairs or ramp for entrance;Direct supervision/assist for financial management;Two people to help with walking and/or transfers   Equipment Recommendations  Other (comment) (TBD at next venue of care)    Recommendations for Other Services       Precautions / Restrictions Precautions Precautions: Fall Restrictions Weight Bearing Restrictions: Yes LLE Weight Bearing: Non weight bearing Other Position/Activity Restrictions: L heel NWB per Dr. Vickki Muff 07/18/22     Mobility  Bed Mobility Overal bed mobility: Needs Assistance Bed Mobility: Supine to Sit, Sit to Supine     Supine to sit: HOB elevated, Mod assist Sit to supine: Max assist   General bed mobility comments: Mod to max assist for trunk and BLE control with heavy cuing for sequencing    Transfers                   General transfer comment: Pt unable to clear surface of mattress with LLE NWB status compliance throughout    Ambulation/Gait                   Stairs             Wheelchair Mobility    Modified Rankin (Stroke Patients Only)       Balance     Sitting balance-Leahy Scale: Poor Sitting balance - Comments: occasional min A to prevent posterior LOB in sitting                                    Cognition Arousal/Alertness: Awake/alert Behavior During Therapy: WFL for tasks assessed/performed Overall Cognitive Status: Within Functional Limits for tasks  assessed                                          Exercises Total Joint Exercises Ankle Circles/Pumps: AROM, Both, 10 reps, Strengthening (manual resistance on the RLE) Quad Sets: Strengthening, Both, 10 reps Short Arc Quad: Strengthening, Both, 10 reps (with manual resistance) Heel Slides: Strengthening, Both, 10 reps Hip ABduction/ADduction: AAROM, Strengthening, 10 reps, Both, AROM Long Arc Quad:  Strengthening, Both, 15 reps Knee Flexion: Strengthening, Both, 15 reps Other Exercises Other Exercises: RLE supine leg press x 10 with manual resistance Other Exercises: Anterior weight shifting in sitting to address posterior instability    General Comments        Pertinent Vitals/Pain Pain Assessment Pain Assessment: No/denies pain    Home Living                          Prior Function            PT Goals (current goals can now be found in the care plan section) Progress towards PT goals: Progressing toward goals    Frequency    7X/week      PT Plan Current plan remains appropriate    Co-evaluation              AM-PAC PT "6 Clicks" Mobility   Outcome Measure  Help needed turning from your back to your side while in a flat bed without using bedrails?: A Lot Help needed moving from lying on your back to sitting on the side of a flat bed without using bedrails?: A Lot Help needed moving to and from a bed to a chair (including a wheelchair)?: Total Help needed standing up from a chair using your arms (e.g., wheelchair or bedside chair)?: Total Help needed to walk in hospital room?: Total Help needed climbing 3-5 steps with a railing? : Total 6 Click Score: 8    End of Session Equipment Utilized During Treatment: Gait belt Activity Tolerance: Patient tolerated treatment well Patient left: in bed;with call bell/phone within reach Nurse Communication: Mobility status PT Visit Diagnosis: Difficulty in walking, not elsewhere classified (R26.2);Muscle weakness (generalized) (M62.81)     Time: [id number] PT Time Calculation (min) (ACUTE ONLY): 28 min  Charges:  $Therapeutic Exercise: 8-22 mins $Therapeutic Activity: 8-22 mins                    D. Scott Scotty Weigelt PT, DPT 07/18/22, 3:05 PM

## 2022-07-18 NOTE — Consult Note (Addendum)
Pharmacy Antibiotic Note  Charles Jennings is a 89 y.o. male with PMH including diabetes, lower limb ischemia, left third toe amputation 06/30/22 admitted on 07/12/2022 with  gangrenous changes to second toe on left foot with cellulitis .  Pharmacy has been consulted for vancomycin and cefepime dosing. Patient is also ordered metronidazole. Since admission renal function has improved  Second toe amputation 2/11 and debridement of necrotic dorsal right foot wound. Renal function stable.   Plan: Continue vancomycin 1,000 mg IV q24h Goal AUC 400-600 Predicted AUC: 530.8 Predicted Cmin 14.4  Also on Unasyn 3 grams every 6 hours  Follow finalization of culture, renal function and dose adjustments.   Height: 5' 7.5" (171.5 cm) Weight: 65.5 kg (144 lb 6.4 oz) IBW/kg (Calculated) : 67.25  Temp (24hrs), Avg:98.1 F (36.7 C), Min:97.9 F (36.6 C), Max:98.5 F (36.9 C)  Recent Labs  Lab 07/12/22 1200 07/12/22 1442 07/13/22 0504 07/13/22 1822 07/14/22 0510 07/15/22 0542 07/15/22 1434 07/15/22 1713 07/15/22 2135 07/15/22 2136 07/15/22 2222 07/16/22 0154 07/16/22 0511 07/17/22 1000 07/18/22 0534  WBC  --   --    < > 12.7* 10.5  --   --   --   --  14.1*  --   --   --  11.6* 7.3  CREATININE  --   --    < >  --  0.98 0.99  --   --  0.98  --   --   --  0.88 0.86 0.79  LATICACIDVEN 1.8 1.7  --   --   --   --   --   --   --   --  3.2* 1.4  --   --   --   VANCOTROUGH  --   --   --   --   --   --  11*  --   --   --   --   --   --   --   --   VANCOPEAK  --   --   --   --   --   --   --  23*  --   --   --   --   --   --   --    < > = values in this interval not displayed.     Estimated Creatinine Clearance: 54.6 mL/min (by C-G formula based on SCr of 0.79 mg/dL).    No Known Allergies  Antimicrobials this admission: cefepime 2/7 >>  vancomycin 2/7 >>  metronidazole 2/7 >>   Microbiology results: 2/7 BCx: NGTD 2/11 Pathology(digit amputation): MRSA and GNR  2/8 MRSA PCR:  negative  Thank you for allowing pharmacy to be a part of this patient's care.  Wynelle Cleveland 07/18/2022 2:22 PM

## 2022-07-18 NOTE — Progress Notes (Signed)
Daily Progress Note   Subjective  - 2 Days Post-Op  Follow-up second toe amputation right foot and debridement dorsal right foot.  Doing well.  Objective Vitals:   07/18/22 0910 07/18/22 1000 07/18/22 1100 07/18/22 1200  BP: (!) 159/69 (!) 149/60 (!) 157/71 (!) 165/102  Pulse:  92 92 90  Resp:  17 (!) 21 16  Temp:      TempSrc:      SpO2:  98% 99% 97%  Weight:      Height:        Physical Exam: Wound bed looks to be stable.  No active purulence.  2-1/2 x 3-1/2 cm full-thickness ulceration with exposed tendon on the dorsal aspect of the right foot.  The distal toe amputation sites are stable as well.  Serous and serosanguineous drainage at this time.  Surrounding erythema is dramatically improved.  Laboratory CBC    Component Value Date/Time   WBC 7.3 07/18/2022 0534   HGB 13.6 07/18/2022 0534   HGB 15.5 01/22/2013 1546   HCT 41.3 07/18/2022 0534   HCT 45.4 01/22/2013 1546   PLT 204 07/18/2022 0534   PLT 203 01/22/2013 1546    BMET    Component Value Date/Time   NA 137 07/18/2022 0534   NA 137 01/22/2013 1546   K 3.6 07/18/2022 0534   K 3.9 01/22/2013 1546   CL 103 07/18/2022 0534   CL 104 01/22/2013 1546   CO2 24 07/18/2022 0534   CO2 28 01/22/2013 1546   GLUCOSE 155 (H) 07/18/2022 0534   GLUCOSE 211 (H) 01/22/2013 1546   BUN 15 07/18/2022 0534   BUN 18 01/22/2013 1546   CREATININE 0.79 07/18/2022 0534   CREATININE 1.35 (H) 01/22/2013 1546   CALCIUM 7.3 (L) 07/18/2022 0534   CALCIUM 8.9 01/22/2013 1546   GFRNONAA >60 07/18/2022 0534   GFRNONAA 48 (L) 01/22/2013 1546   GFRAA >60 01/28/2016 1259   GFRAA 56 (L) 01/22/2013 1546    Assessment/Planning: Status post amputation right second toe for gangrene and debridement necrotic dorsal right foot wound  At this point will institute a wound VAC for this large ulcerative wound dorsal right foot.  Will ask the wound care nurse to facilitate. Maintain nonweightbearing to the right foot. We will monitor for  wound healing and infection in the outpatient clinic. Patient still at high risk for continued tissue loss to this area. Will continue to follow while inpatient.  Samara Deist A  07/18/2022, 1:04 PM

## 2022-07-18 NOTE — Consult Note (Signed)
NAME: Charles Jennings  DOB: 02/22/1928  MRN: [id number]  Date/Time: 07/18/2022 3:36 PM  REQUESTING PROVIDER: Dr.Kumar Subjective:  REASON FOR CONSULT: foot infection ? Charles Jennings is a 89 y.o. male with a history of DM,peripheral neuropathy , HTN, CAD, PAD presented to ED  on 07/12/22 with left foot 2nd toe gangrene . He was followed by podiatrist who advised him to go to the ED.  Pt says in Nov 2023  he had his toe nails clipped at the podiatrist office and  one was clipped close to the skin and got infected and  was hard to heal- He came to the ED on 06/11/22 and noted to have necrotic tip of the 2nd toe-        He underwent angio and PTLA of Left SFA and stent placement to left SFA and popliteal artery on 06/12/22 On 06/14/22 he had another angio and kissing stent placement to bilateral common iliac arteries and was dc on 06/15/22 to follow up with podiatrist and to observe for demarcation of gangrene On 06/30/22 he underwent amputation of the 3 rd toe He is now admitted with progressive gangrene of the 2 nd toe and swelling leg In the Ed vitals on 2/7 were  07/12/22 11:01  BP 115/70  Temp 97.5 F (36.4 C) !  Pulse Rate 92  Resp 18  SpO2 100 %    Latest Reference Range & Units 07/12/22 11:30  WBC 4.0 - 10.5 K/uL 16.8 (H)  Hemoglobin 13.0 - 17.0 g/dL 17.1 (H)  HCT 39.0 - 52.0 % 51.7  Platelets 150 - 400 K/uL 211  Creatinine 0.61 - 1.24 mg/dL 1.36 (H)   Started on vanco and unasyn On 07/13/22 underwent angio and mechanical thrombectomy of left SFA, popliteal artery , TPA on the left Underwent amputation/disarticulation  of the 2 nd toe at the MTP joint on 07/16/22. As the culture is MRSA and the bone pathology was acute osteo I am consulted   Pt was living by himself until this admission and was independent of ADL  Past Medical History:  Diagnosis Date   (HFpEF) heart failure with preserved ejection fraction (HCC)    Arthritis    Complete heart block ([place])    a.) s/p dual  chamber Medtronic PPM placement 01/31/2016   Frequent PVCs    Hemorrhoids    a.) s/p banding 01/2021   HLD (hyperlipidemia)    Hypertension    Long term current use of antithrombotics/antiplatelets    a.) on DAPT (ASA + clopidogrel)   Peripheral vascular disease ([place])    Presence of permanent cardiac pacemaker 01/31/2016   a.) s/p Advisa DR MRI SureScan dual chamber PPM (SN: [id number])   SDH (subdural hematoma) (HCC)    Skin cancer    Type 2 diabetes mellitus treated with insulin [facility])     Past Surgical History:  Procedure Laterality Date   AMPUTATION TOE Left 06/30/2022   Procedure: AMPUTATION TOE - [place];  Surgeon: Sharlotte Alamo, DPM;  Location: [facility] ORS;  Service: Podiatry;  Laterality: Left;   AMPUTATION TOE Left 07/16/2022   Procedure: AMPUTATION TOE;  Surgeon: Samara Deist, DPM;  Location: [facility] OR[facility];  Service: Podiatry;  Laterality: Left;   EYE SURGERY     FEMORAL ENDARTERECTOMY Bilateral    HEMORRHOID BANDING N/A 01/2021   HERNIA REPAIR     double hernia   LOWER EXTREMITY ANGIOGRAPHY Left 06/12/2022   Procedure: Lower Extremity Angiography;  Surgeon: Algernon Huxley, MD;  Location: [place] [facility];  Service: Cardiovascular;  Laterality: Left;   LOWER EXTREMITY ANGIOGRAPHY Left 07/13/2022   Procedure: Lower Extremity Angiography;  Surgeon: Algernon Huxley, MD;  Location: [place] [facility];  Service: Cardiovascular;  Laterality: Left;   LOWER EXTREMITY ANGIOGRAPHY Left 07/14/2022   Procedure: Lower Extremity Angiography;  Surgeon: Algernon Huxley, MD;  Location: [place] [facility];  Service: Cardiovascular;  Laterality: Left;   LOWER EXTREMITY INTERVENTION Bilateral 06/14/2022   Procedure: LOWER EXTREMITY INTERVENTION;  Surgeon: Algernon Huxley, MD;  Location: [place] [facility];  Service: Cardiovascular;  Laterality: Bilateral;   PACEMAKER INSERTION Left 01/31/2016   Procedure: PACEMAKER INSERTION; Location: [facility]; Surgeon: Dimas Alexandria, MD    Social  History   Socioeconomic History   Marital status: Widowed    Spouse name: Not on file   Number of children: Not on file   Years of education: Not on file   Highest education level: Not on file  Occupational History   Not on file  Tobacco Use   Smoking status: Never   Smokeless tobacco: Never  Substance and Sexual Activity   Alcohol use: Not Currently   Drug use: Never   Sexual activity: Not Currently  Other Topics Concern   Not on file  Social History Narrative   Lives alone   Social Determinants of Health   Financial Resource Strain: Not on file  Food Insecurity: No Food Insecurity (07/12/2022)   Hunger Vital Sign    Worried About Running Out of Food in the Last Year: Never true    Ran Out of Food in the Last Year: Never true  Transportation Needs: No Transportation Needs (07/12/2022)   PRAPARE - Hydrologist (Medical): No    Lack of Transportation (Non-Medical): No  Physical Activity: Not on file  Stress: Not on file  Social Connections: Not on file  Intimate Partner Violence: Not At Risk (07/12/2022)   Humiliation, Afraid, Rape, and Kick questionnaire    Fear of Current or Ex-Partner: No    Emotionally Abused: No    Physically Abused: No    Sexually Abused: No    Family History  Problem Relation Age of Onset   Liver disease Father    Hypertension Brother    Heart attack Brother    Alpha-1 antitrypsin deficiency Granddaughter    No Known Allergies I? Current Facility-Administered Medications  Medication Dose Route Frequency Provider Last Rate Last Admin   0.9 %  sodium chloride infusion   Intravenous Continuous Val Riles, MD 75 mL/hr at 07/18/22 0600 Infusion Verify at 07/18/22 0600   0.9 %  sodium chloride infusion   Intravenous PRN Val Riles, MD   Stopped at 07/17/22 1621   Ampicillin-Sulbactam (UNASYN) 3 g in sodium chloride 0.9 % 100 mL IVPB  3 g Intravenous Q6H Val Riles, MD 200 mL/hr at 07/18/22 0926 3 g at 07/18/22  0926   apixaban (ELIQUIS) tablet 2.5 mg  2.5 mg Oral BID Annalee Genta R, NP   2.5 mg at 07/18/22 0910   aspirin EC tablet 81 mg  81 mg Oral Daily Pace, Warden Fillers R, NP   81 mg at 07/18/22 0910   atorvastatin (LIPITOR) tablet 40 mg  40 mg Oral QPM Samara Deist, DPM   40 mg at 07/17/22 1827   Chlorhexidine Gluconate Cloth 2 % PADS 6 each  6 each Topical Daily Samara Deist, DPM   6 each at 07/18/22 1325   diltiazem (CARDIZEM  CD) 24 hr capsule 240 mg  240 mg Oral Daily Samara Deist, DPM   240 mg at 07/18/22 0910   gabapentin (NEURONTIN) capsule 300 mg  300 mg Oral Daily Samara Deist, DPM   300 mg at 07/18/22 [id number]   hydrALAZINE (APRESOLINE) injection 10 mg  10 mg Intravenous Q6H PRN Val Riles, MD       hydrALAZINE (APRESOLINE) tablet 50 mg  50 mg Oral Q6H PRN Val Riles, MD       HYDROmorphone (DILAUDID) injection 1 mg  1 mg Intravenous Once PRN Samara Deist, DPM       insulin aspart (novoLOG) injection 0-5 Units  0-5 Units Subcutaneous QHS Samara Deist, DPM       insulin aspart (novoLOG) injection 0-9 Units  0-9 Units Subcutaneous TID WC Samara Deist, DPM   2 Units at 07/18/22 1250   lisinopril (ZESTRIL) tablet 40 mg  40 mg Oral Daily Samara Deist, DPM   40 mg at 07/18/22 0910   morphine (PF) 2 MG/ML injection 2 mg  2 mg Intravenous Q4H PRN Samara Deist, DPM   2 mg at 07/17/22 1321   ondansetron (ZOFRAN) injection 4 mg  4 mg Intravenous Q6H PRN Samara Deist, DPM   4 mg at 07/15/22 [id number]   Oral care mouth rinse  15 mL Mouth Rinse PRN Val Riles, MD       phosphorus (K PHOS NEUTRAL) tablet 500 mg  500 mg Oral TID Val Riles, MD   500 mg at 07/18/22 [id number]   senna-docusate (Senokot-S) tablet 1 tablet  1 tablet Oral QHS PRN Samara Deist, DPM       vancomycin (VANCOCIN) IVPB 1000 mg/200 mL premix  1,000 mg Intravenous Q24H Samara Deist, DPM 200 mL/hr at 07/18/22 1323 1,000 mg at 07/18/22 1323     Abtx:  Anti-infectives (From admission, onward)    Start     Dose/Rate Route  Frequency Ordered Stop   07/17/22 2200  Ampicillin-Sulbactam (UNASYN) 3 g in sodium chloride 0.9 % 100 mL IVPB        3 g 200 mL/hr over 30 Minutes Intravenous Every 6 hours 07/17/22 1410     07/16/22 1400  vancomycin (VANCOCIN) IVPB 1000 mg/200 mL premix        1,000 mg 200 mL/hr over 60 Minutes Intravenous Every 24 hours 07/15/22 1813     07/13/22 1603  ceFAZolin (ANCEF) IVPB 2g/100 mL premix        2 g 200 mL/hr over 30 Minutes Intravenous 30 min pre-op 07/13/22 1603 07/13/22 1718   07/13/22 1400  vancomycin (VANCOREADY) IVPB 750 mg/150 mL  Status:  Discontinued        750 mg 150 mL/hr over 60 Minutes Intravenous Every 24 hours 07/12/22 1328 07/15/22 1813   07/12/22 2200  ceFEPIme (MAXIPIME) 2 g in sodium chloride 0.9 % 100 mL IVPB  Status:  Discontinued        2 g 200 mL/hr over 30 Minutes Intravenous Every 12 hours 07/12/22 1328 07/17/22 1409   07/12/22 1215  vancomycin (VANCOREADY) IVPB 1500 mg/300 mL        1,500 mg 150 mL/hr over 120 Minutes Intravenous  Once 07/12/22 1207 07/12/22 1548   07/12/22 1215  ceFEPIme (MAXIPIME) 2 g in sodium chloride 0.9 % 100 mL IVPB        2 g 200 mL/hr over 30 Minutes Intravenous  Once 07/12/22 1207 07/12/22 1315   07/12/22 1200  metroNIDAZOLE (FLAGYL) IVPB 500 mg  Status:  Discontinued        500 mg 100 mL/hr over 60 Minutes Intravenous Every 12 hours 07/12/22 1159 07/17/22 1409       REVIEW OF SYSTEMS:  Const: negative fever, negative chills, negative weight loss Eyes: negative diplopia or visual changes, negative eye pain ENT: negative coryza, negative sore throat Resp: negative cough, hemoptysis, dyspnea Cards: negative for chest pain, palpitations, lower extremity edema GU: negative for frequency, dysuria and hematuria GI: Negative for abdominal pain, diarrhea, bleeding, constipation Skin: negative for rash and pruritus Heme: negative for easy bruising and gum/nose bleeding MS: weakness, pain left foot Neurolo:negative for  headaches, dizziness, vertigo, memory problems  Psych: negative for feelings of anxiety, depression  Endocrine:  diabetes Allergy/Immunology- NKDA Objective:  VITALS:  BP (!) 148/64   Pulse 88   Temp 98 F (36.7 C) (Oral)   Resp 17   Ht 5' 7.5" (1.715 m)   Wt 65.5 kg   SpO2 97%   BMI 22.28 kg/m   PHYSICAL EXAM: thru video call General: Awake , cooperative, slow speech, oriented in person, place, year, date.  Head: Normocephalic, without obvious abnormality, atraumatic. Lungs/Heart, abdomen cannot be examined Extremities: left foot dressing present pictures reviewed 07/13/22   07/15/22   07/17/22   Skin: as above Neurologic: did not examine Pertinent Labs Lab Results CBC    Component Value Date/Time   WBC 7.3 07/18/2022 0534   RBC 4.73 07/18/2022 0534   HGB 13.6 07/18/2022 0534   HGB 15.5 01/22/2013 1546   HCT 41.3 07/18/2022 0534   HCT 45.4 01/22/2013 1546   PLT 204 07/18/2022 0534   PLT 203 01/22/2013 1546   MCV 87.3 07/18/2022 0534   MCV 88 01/22/2013 1546   MCH 28.8 07/18/2022 0534   MCHC 32.9 07/18/2022 0534   RDW 14.1 07/18/2022 0534   RDW 13.9 01/22/2013 1546   LYMPHSABS 1.7 07/15/2022 2136   MONOABS 1.7 (H) 07/15/2022 2136   EOSABS 0.0 07/15/2022 2136   BASOSABS 0.1 07/15/2022 2136       Latest Ref Rng & Units 07/18/2022    5:34 AM 07/17/2022   10:00 AM 07/16/2022    5:11 AM  CMP  Glucose 70 - 99 mg/dL 155  186  222   BUN 8 - 23 mg/dL 15  19  28   $ Creatinine 0.61 - 1.24 mg/dL 0.79  0.86  0.88   Sodium 135 - 145 mmol/L 137  138  138   Potassium 3.5 - 5.1 mmol/L 3.6  3.7  4.3   Chloride 98 - 111 mmol/L 103  106  101   CO2 22 - 32 mmol/L 24  23  31   $ Calcium 8.9 - 10.3 mg/dL 7.3  7.3  7.5       Microbiology: Recent Results (from the past 240 hour(s))  Blood Culture (routine x 2)     Status: None   Collection Time: 07/12/22 12:01 PM   Specimen: BLOOD  Result Value Ref Range Status   Specimen Description BLOOD BLOOD RIGHT WRIST  Final    Special Requests   Final    BOTTLES DRAWN AEROBIC AND ANAEROBIC BLOOD RIGHT WRIST   Culture   Final    NO GROWTH 5 DAYS Performed at [facility], [address], [place], [place] [address]    Report Status 07/17/2022 FINAL  Final  Blood Culture (routine x 2)     Status: None   Collection Time: 07/12/22 12:01 PM   Specimen: BLOOD  Result  Value Ref Range Status   Specimen Description BLOOD BLOOD LEFT WRIST  Final   Special Requests   Final    BOTTLES DRAWN AEROBIC AND ANAEROBIC Blood Culture results may not be optimal due to an inadequate volume of blood received in culture bottles   Culture   Final    NO GROWTH 5 DAYS Performed at [facility], [address] [place], [place] [address]    Report Status 07/17/2022 FINAL  Final  MRSA Next Gen by PCR, Nasal     Status: None   Collection Time: 07/13/22  7:36 PM   Specimen: Nasal Mucosa; Nasal Swab  Result Value Ref Range Status   MRSA by PCR Next Gen NOT DETECTED NOT DETECTED Final    Comment: (NOTE) The GeneXpert MRSA Assay (FDA approved for NASAL specimens only), is one component of a comprehensive MRSA colonization surveillance program. It is not intended to diagnose MRSA infection nor to guide or monitor treatment for MRSA infections. Test performance is not FDA approved in patients less than 7 years old. Performed at [facility], [address] [place], [place] [address]   Culture, blood (Routine X 2) w Reflex to ID Panel     Status: None (Preliminary result)   Collection Time: 07/16/22  1:54 AM   Specimen: BLOOD LEFT ARM  Result Value Ref Range Status   Specimen Description BLOOD LEFT ARM  Final   Special Requests   Final    BOTTLES DRAWN AEROBIC AND ANAEROBIC Blood Culture adequate volume   Culture   Final    NO GROWTH 2 DAYS Performed at [facility], [address] [place], [place] [address]    Report Status PENDING  Incomplete  Culture, blood (Routine X 2) w Reflex to ID  Panel     Status: None (Preliminary result)   Collection Time: 07/16/22  2:00 AM   Specimen: BLOOD LEFT HAND  Result Value Ref Range Status   Specimen Description BLOOD LEFT HAND  Final   Special Requests   Final    BOTTLES DRAWN AEROBIC AND ANAEROBIC Blood Culture adequate volume   Culture   Final    NO GROWTH 2 DAYS Performed at [facility], [address] [place], [place] [address]    Report Status PENDING  Incomplete  Aerobic/Anaerobic Culture w Gram Stain (surgical/deep wound)     Status: None (Preliminary result)   Collection Time: 07/16/22 11:51 AM   Specimen: PATH Digit amputation; Tissue  Result Value Ref Range Status   Specimen Description   Final    WOUND Performed at [facility], [address] [place], [place] [address]    Special Requests LEFT 3RD TOE ABSC  Final   Gram Stain   Final    RARE WBC PRESENT, PREDOMINANTLY PMN GRAM POSITIVE COCCI Performed at [facility], [place] [address] [place],  [address]    Culture   Final    FEW METHICILLIN RESISTANT STAPHYLOCOCCUS AUREUS RARE GRAM NEGATIVE RODS CULTURE REINCUBATED FOR BETTER GROWTH NO ANAEROBES ISOLATED; CULTURE IN PROGRESS FOR 5 DAYS    Report Status PENDING  Incomplete   Organism ID, Bacteria METHICILLIN RESISTANT STAPHYLOCOCCUS AUREUS  Final      Susceptibility   Methicillin resistant staphylococcus aureus - MIC*    CIPROFLOXACIN >=8 RESISTANT Resistant     ERYTHROMYCIN >=8 RESISTANT Resistant     GENTAMICIN <=0.5 SENSITIVE Sensitive     OXACILLIN >=4 RESISTANT Resistant     TETRACYCLINE <=1  SENSITIVE Sensitive     VANCOMYCIN 1 SENSITIVE Sensitive     TRIMETH/SULFA <=10 SENSITIVE Sensitive     CLINDAMYCIN <=0.25 SENSITIVE Sensitive     RIFAMPIN <=0.5 SENSITIVE Sensitive     Inducible Clindamycin NEGATIVE Sensitive     * FEW METHICILLIN RESISTANT STAPHYLOCOCCUS AUREUS    IMAGING RESULTS: Xray left foot on 2/8 No evidence of osteo I have personally reviewed the  films ? Impression/Recommendation ? ?Gangrene 2nd toe of left foot in a patient with PAD, DM, neuropathy. S/p amputation of 2nd toe on 07/16/22 MRSA in culture Pathology acute osteo, present at the disarticulated edge Recent amputation of 3rd toe on 06/30/22 Pt currently on unasyn and vanco- Dc vanco and switch to PO linezolid Communicated with Dr.Fowler- Therapeutic amputation hence IV antibiotic not needed on discharge.  PAD- s/p multiple angio Stent , mechanical thrombectomy  DM- on insulin, metformin and Jardiance  CAD  Pacemaker in place  Discussed the management with patient in detail ___________________________________________________  Note:  This document was prepared using Dragon voice recognition software and may include unintentional dictation errors.

## 2022-07-19 DIAGNOSIS — M869 Osteomyelitis, unspecified: Secondary | ICD-10-CM | POA: Diagnosis not present

## 2022-07-19 LAB — CBC
HCT: 40 % (ref 39.0–52.0)
Hemoglobin: 13.1 g/dL (ref 13.0–17.0)
MCH: 28.5 pg (ref 26.0–34.0)
MCHC: 32.8 g/dL (ref 30.0–36.0)
MCV: 87 fL (ref 80.0–100.0)
Platelets: 228 10*3/uL (ref 150–400)
RBC: 4.6 MIL/uL (ref 4.22–5.81)
RDW: 14.1 % (ref 11.5–15.5)
WBC: 8.9 10*3/uL (ref 4.0–10.5)
nRBC: 0 % (ref 0.0–0.2)

## 2022-07-19 LAB — BASIC METABOLIC PANEL
Anion gap: 6 (ref 5–15)
BUN: 13 mg/dL (ref 8–23)
CO2: 27 mmol/L (ref 22–32)
Calcium: 7.3 mg/dL — ABNORMAL LOW (ref 8.9–10.3)
Chloride: 104 mmol/L (ref 98–111)
Creatinine, Ser: 0.8 mg/dL (ref 0.61–1.24)
GFR, Estimated: >60 mL/min (ref >60)
Glucose, Bld: 184 mg/dL — ABNORMAL HIGH (ref 70–99)
Potassium: 3.5 mmol/L (ref 3.5–5.1)
Sodium: 137 mmol/L (ref 135–145)

## 2022-07-19 LAB — GLUCOSE, CAPILLARY
Glucose-Capillary: 175 mg/dL — ABNORMAL HIGH (ref 70–99)
Glucose-Capillary: 150 mg/dL — ABNORMAL HIGH (ref 70–99)
Glucose-Capillary: 190 mg/dL — ABNORMAL HIGH (ref 70–99)
Glucose-Capillary: 230 mg/dL — ABNORMAL HIGH (ref 70–99)

## 2022-07-19 LAB — PHOSPHORUS: Phosphorus: 2.6 mg/dL (ref 2.5–4.6)

## 2022-07-19 LAB — MAGNESIUM: Magnesium: 1.8 mg/dL (ref 1.7–2.4)

## 2022-07-19 MED ORDER — OXYCODONE HCL 5 MG PO TABS
5.0000 mg | ORAL_TABLET | Freq: Four times a day (QID) | ORAL | Status: DC | PRN
  Administered 2022-07-21: 5 mg via ORAL
  Filled 2022-07-19: qty 1

## 2022-07-19 NOTE — TOC Progression Note (Signed)
Transition of Care [facility]) - Progression Note    Patient Details  Name: Charles Jennings MRN: [id number] Date of Birth: [birth year]  Transition of [facility]) CM/SW [place], RN Phone Number: 07/19/2022, 9:22 AM  Clinical Narrative:   Called and spoke to Daughter Gabriel Cirri, She is in [place] on her way from [place] and on her way, she stated that she will be here in a couple hours and would like to review the beds in person with Case management and the patient , I will visit with them and review offers once she is here    Expected Discharge Plan: [place] Barriers to Discharge: Continued Medical Work up  Expected Discharge Plan and Services   Discharge Planning Services: CM Consult Post Acute Care Choice: [place] Living arrangements for the past 2 months: Apartment                 DME Arranged: N/A DME Agency: NA       HH Arranged: NA [place] Agency: NA         Social Determinants of Health (SDOH) Interventions SDOH Screenings   Food Insecurity: No Food Insecurity (07/12/2022)  Housing: Low Risk  (07/12/2022)  Transportation Needs: No Transportation Needs (07/12/2022)  Utilities: Not At Risk (07/12/2022)  Tobacco Use: Low Risk  (07/17/2022)  Recent Concern: Tobacco Use - Medium Risk (07/12/2022)    Readmission Risk Interventions     No data to display

## 2022-07-19 NOTE — Plan of Care (Signed)
  Problem: Education: Goal: Knowledge of General Education information will improve Description: Including pain rating scale, medication(s)/side effects and non-pharmacologic comfort measures Outcome: Progressing   Problem: Health Behavior/Discharge Planning: Goal: Ability to manage health-related needs will improve Outcome: Progressing   Problem: Clinical Measurements: Goal: Ability to maintain clinical measurements within normal limits will improve Outcome: Progressing Goal: Will remain free from infection Outcome: Progressing Goal: Diagnostic test results will improve Outcome: Progressing Goal: Respiratory complications will improve Outcome: Progressing Goal: Cardiovascular complication will be avoided Outcome: Progressing   Problem: Coping: Goal: Level of anxiety will decrease Outcome: Progressing   Problem: Pain Managment: Goal: General experience of comfort will improve Outcome: Progressing

## 2022-07-19 NOTE — Consult Note (Signed)
[place] contacted podiatry to communicate that no onsite [facility] nursing 2/16, would suggest NPWT dressing changed 2/15 and then again 2/19 unless MD can change over the weekend.  Dr. Vickki Muff gave orders via secure chat ok to change 2/15 and then 2/19.  MD aware of possible needs for provider (podiatry) over the weekend.   Updated NPWT orders accordingly.   [place], [place], [place]

## 2022-07-19 NOTE — Care Management Important Message (Signed)
Important Message  Patient Details  Name: Charles Jennings MRN: [id number] Date of Birth: 25-Jul-1927   Medicare Important Message Given:  Other (see comment)  Left a message for the patient's daughter, Laural Benes [phone]) to review the Important Message from Medicare. Will await a return call.     Juliann Pulse A Mizani Dilday 07/19/2022, 3:17 PM

## 2022-07-19 NOTE — Progress Notes (Signed)
[facility] Progress Note  Patient: Charles Jennings    [id number]  DOA: 07/12/2022     Date of Service: the patient was seen and examined on 07/19/2022  Chief Complaint  Patient presents with   Foot Problem   Brief hospital course: 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   Initial vitals in the ED showed temperature of 97.6, respiration rate of 18, heart rate 76, blood pressure 165/67, SpO2 of 96% on room air.   Serum sodium is 131, potassium 4.5, chloride 90, bicarb 22, BUN of 53, serum creatinine 1.36, nonfasting blood glucose 242, EGFR 49, WBC 16.8, hemoglobin 17.1, platelets of 211.   Anion gap was elevated at 19.  Alk phos elevated at 212.   ED treatment: Cefepime, vancomycin, metronidazole.  He reports he saw Dr. Cleda Mccreedy, and was advised to come to the ED. He reports the left foot has been bothering him for over one year. He states this was a regular scheduled visit.    Assessment and Plan: Principal Problem:   Osteomyelitis (Charles Jennings) Active Problems:   Limb ischemia   Insulin dependent type 2 diabetes mellitus (HCC)   Mixed hyperlipidemia   Benign essential HTN   Stage 3a chronic kidney disease (CKD) (HCC)   Weakness   LLE peripheral artery disease Vascular surgery consulted, On 2/8 s/p angiogram, mechanical thrombectomy of left SFA pop with popliteal artery and tibial peroneal trunk. S/p tPA at the time of procedure, patient was started on continuous tPA infusion overnight, right femoral triple-lumen catheter was placed. On 2/9  Left lower extremity angiogram, Mechanical thrombectomy of the left SFA, popliteal artery, tibioperoneal trunk, and proximal posterior tibial artery with the penumbra CAT 6 device. Stent placement to  the left distal SFA and above-knee popliteal artery with 6 mm diameter by 15 cm length Viabahn stent for residual thrombus and stenosis after thrombectomy. StarClose closure device right femoral artery S/p Heparin IV infusion, transition to Eliquis 2.5 mg p.o. twice daily and aspirin 81 mg p.o. daily as per vascular surgery. Follow vascular surgery for further recommendation  # Osteomyelitis of Left third toe With possible cellulitis at the site of left foot amputation Podiatry consulted, on 2/11 s/p amputation left second toeMTP J and Excisional debridement necrotic tissue and abscess to and including tendon and muscle dorsal left foot Ankle block for postoperative anesthetic left ankle  S/p cefepime and Flagyl, discontinued on 2/12 due to possible toxicity as patient was having paranoid thoughts and delirium S/p vancomycin, transition to Zyvox 6 mg p.o. twice daily and continued Unasyn Blood cultures x 2 NGTD S/p IVF for hydration 2/11 wound culture from the OR growing MRSA ID recommendation appreciated.  We will follow ID for duration of treatment.   Insulin dependent type 2 diabetes mellitus (HCC) - Home NovoLog 70/30, 10 units daily before breakfast not resumed on admission - Jardiance 10 mg daily, metformin 500 mg p.o. twice daily were not resumed on admission - Insulin SSI with at bedtime coverage ordered - Goal inpatient blood glucose levels 140-180   Mixed hyperlipidemia - Atorvastatin 40 mg daily   Benign essential HTN - Diltiazem 240 mg daily, lisinopril 40 mg daily resumed - Hydralazine 5 mg IV every 8 hours as needed for SBP greater than 175, 4 days ordered   Weakness Patient need on the floor  for 1 day due to extreme weakness, per daughter - CK 66 wnl, B12 level 563 wnl, TSH 3.17 wnl - PT and OT eval done, recommend SNF placement   Stage 3a chronic kidney disease (CKD) (HCC) - Serum creatinine elevation, does not meet criteria for acute kidney injury, presumed  secondary to poor p.o. intake Serum creatinine ranges 1.03-1.29-->0.88 S/p IVF  - Check BMP in a.m.  Isotonic hyponatremia most likely due to nutritional deficiency Serum osmolality 289, Sodium 132--138 Resolved   Hypomagnesemia, mag repleted. Monitor and replete as needed.  Hypophosphatemia, Phos repleted.   monitor and replete as needed.  Body mass index is 21.58 kg/m.  Interventions:       Diet: Diabetic carb modified diet DVT Prophylaxis: Subcutaneous Heparin    Advance goals of care discussion: Full code  Family Communication: family was present at bedside, at the time of interview.  The pt provided permission to discuss medical plan with the family. Opportunity was given to ask question and all questions were answered satisfactorily.   Disposition:  Pt is from Home, admitted with left toe osteomyelitis, vascular surgery and podiatry consulted, s/p LLE angiogram, thrombectomy.  S/p amputation left 2nd toe, which precludes a safe discharge. Discharge to SNF as per  PT/OT eval, when medically stable and cleared by podiatry, vascular surgery and ID.  Subjective: No significant events overnight, patient stated that he feels like normal, pain is off and on increases with the movement.  Patient is having throbbing neuropathic pain.  Denies any chest pain or palpitation, no shortness of breath, no any other active issues.   Physical Exam: General: NAD, lying comfortably Appear in no distress, affect appropriate Eyes: PERRLA ENT: Oral Mucosa Clear, moist  Neck: no JVD,  Cardiovascular: S1 and S2 Present, no Murmur,  Respiratory: good respiratory effort, Bilateral Air entry equal and Decreased, no Crackles, no wheezes Abdomen: Bowel Sound present, Soft and no tenderness,  Skin: no rashes Extremities: no Pedal edema, no calf tenderness.  Left foot dressing CDI, wound VAC attached Neurologic: without any new focal findings Gait not checked due to patient safety  concerns  Vitals:   07/18/22 1600 07/18/22 1855 07/19/22 0005 07/19/22 0743  BP:  132/76 139/61 (!) 140/60  Pulse:  78 79 76  Resp:  16 16 15  $ Temp: 97.6 F (36.4 C) 98 F (36.7 C) 98.7 F (37.1 C) 97.7 F (36.5 C)  TempSrc: Oral     SpO2:  97% 97% 93%  Weight:      Height:        Intake/Output Summary (Last 24 hours) at 07/19/2022 1348 Last data filed at 07/19/2022 0758 Gross per 24 hour  Intake --  Output 700 ml  Net -700 ml   Filed Weights   07/12/22 1104 07/13/22 1930 07/15/22 0000  Weight: 62.5 kg 55 kg 65.5 kg    Data Reviewed: I have personally reviewed and interpreted daily labs, tele strips, imagings as discussed above. I reviewed all nursing notes, pharmacy notes, vitals, pertinent old records I have discussed plan of care as described above with RN and patient/family.  CBC: Recent Labs  Lab 07/14/22 0510 07/15/22 2136 07/17/22 1000 07/18/22 0534 07/19/22 0346  WBC 10.5 14.1* 11.6* 7.3 8.9  NEUTROABS  --  10.5*  --   --   --   HGB 13.4 12.2* 14.4 13.6 13.1  HCT 40.9 36.4* 42.9 41.3 40.0  MCV 89.1 87.7 86.3 87.3 87.0  PLT 152 148* 217 204 228   Basic  Metabolic Panel: Recent Labs  Lab 07/15/22 0542 07/15/22 2135 07/15/22 2136 07/16/22 0511 07/17/22 1000 07/18/22 0534 07/19/22 0346  NA 138 133*  --  138 138 137 137  K 4.3 3.4*  --  4.3 3.7 3.6 3.5  CL 105 106  --  101 106 103 104  CO2 20* 22  --  31 23 24 27  $ GLUCOSE 177* 133*  --  222* 186* 155* 184*  BUN 28* 31*  --  28* 19 15 13  $ CREATININE 0.99 0.98  --  0.88 0.86 0.79 0.80  CALCIUM 8.1* 7.5*  --  7.5* 7.3* 7.3* 7.3*  MG 2.0  --  1.9  --  1.6* 1.8 1.8  PHOS 3.2  --  1.5* 3.0 1.9* 2.2* 2.6    Studies: No results found.  Scheduled Meds:  apixaban  2.5 mg Oral BID   aspirin EC  81 mg Oral Daily   atorvastatin  40 mg Oral QPM   Chlorhexidine Gluconate Cloth  6 each Topical Daily   diltiazem  240 mg Oral Daily   gabapentin  300 mg Oral Daily   insulin aspart  0-5 Units Subcutaneous  QHS   insulin aspart  0-9 Units Subcutaneous TID WC   linezolid  600 mg Oral Q12H   lisinopril  40 mg Oral Daily   phosphorus  500 mg Oral TID   Continuous Infusions:  sodium chloride 75 mL/hr at 07/18/22 1712   sodium chloride Stopped (07/17/22 1621)   ampicillin-sulbactam (UNASYN) IV 3 g (07/19/22 0945)   PRN Meds: sodium chloride, hydrALAZINE, hydrALAZINE, ondansetron (ZOFRAN) IV, mouth rinse, oxyCODONE, senna-docusate  Time spent: 40 minutes  Author: Val Riles. MD [facility] 07/19/2022 1:48 PM  To reach On-call, see care teams to locate the attending and reach out to them via [email or web address]. If 7PM-7AM, please contact night-coverage If you still have difficulty reaching the attending provider, please page the [facility] (Director on Call) for [facility] on amion for assistance.

## 2022-07-19 NOTE — Plan of Care (Signed)
  Problem: Education: Goal: Knowledge of General Education information will improve Description: Including pain rating scale, medication(s)/side effects and non-pharmacologic comfort measures Outcome: Progressing   Problem: Health Behavior/Discharge Planning: Goal: Ability to manage health-related needs will improve Outcome: Progressing   Problem: Clinical Measurements: Goal: Ability to maintain clinical measurements within normal limits will improve Outcome: Progressing Goal: Will remain free from infection Outcome: Progressing Goal: Diagnostic test results will improve Outcome: Progressing Goal: Respiratory complications will improve Outcome: Progressing Goal: Cardiovascular complication will be avoided Outcome: Progressing   Problem: Activity: Goal: Risk for activity intolerance will decrease Outcome: Progressing   Problem: Nutrition: Goal: Adequate nutrition will be maintained Outcome: Progressing   Problem: Coping: Goal: Level of anxiety will decrease Outcome: Progressing   Problem: Elimination: Goal: Will not experience complications related to bowel motility Outcome: Progressing Goal: Will not experience complications related to urinary retention Outcome: Progressing   Problem: Pain Managment: Goal: General experience of comfort will improve Outcome: Progressing   Problem: Safety: Goal: Ability to remain free from injury will improve Outcome: Progressing   Problem: Skin Integrity: Goal: Risk for impaired skin integrity will decrease Outcome: Progressing   Problem: Education: Goal: Ability to describe self-care measures that may prevent or decrease complications (Diabetes Survival Skills Education) will improve Outcome: Progressing Goal: Individualized Educational Video(s) Outcome: Progressing   Problem: Coping: Goal: Ability to adjust to condition or change in health will improve Outcome: Progressing   Problem: Fluid Volume: Goal: Ability to  maintain a balanced intake and output will improve Outcome: Progressing   Problem: Health Behavior/Discharge Planning: Goal: Ability to identify and utilize available resources and services will improve Outcome: Progressing Goal: Ability to manage health-related needs will improve Outcome: Progressing   Problem: Metabolic: Goal: Ability to maintain appropriate glucose levels will improve Outcome: Progressing   Problem: Nutritional: Goal: Maintenance of adequate nutrition will improve Outcome: Progressing Goal: Progress toward achieving an optimal weight will improve Outcome: Progressing   Problem: Skin Integrity: Goal: Risk for impaired skin integrity will decrease Outcome: Progressing   Problem: Tissue Perfusion: Goal: Adequacy of tissue perfusion will improve Outcome: Progressing   Problem: Education: Goal: Ability to identify signs and symptoms of gastrointestinal bleeding will improve Outcome: Progressing   Problem: Bowel/Gastric: Goal: Will show no signs and symptoms of gastrointestinal bleeding Outcome: Progressing   Problem: Fluid Volume: Goal: Will show no signs and symptoms of excessive bleeding Outcome: Progressing   Problem: Clinical Measurements: Goal: Complications related to the disease process, condition or treatment will be avoided or minimized Outcome: Progressing   Problem: Education: Goal: Knowledge of the prescribed therapeutic regimen will improve Outcome: Progressing Goal: Ability to verbalize activity precautions or restrictions will improve Outcome: Progressing Goal: Understanding of discharge needs will improve Outcome: Progressing   Problem: Activity: Goal: Ability to perform//tolerate increased activity and mobilize with assistive devices will improve Outcome: Progressing   Problem: Clinical Measurements: Goal: Postoperative complications will be avoided or minimized Outcome: Progressing   Problem: Self-Care: Goal: Ability to meet  self-care needs will improve Outcome: Progressing   Problem: Self-Concept: Goal: Ability to maintain and perform role responsibilities to the fullest extent possible will improve Outcome: Progressing   Problem: Pain Management: Goal: Pain level will decrease with appropriate interventions Outcome: Progressing

## 2022-07-19 NOTE — Progress Notes (Signed)
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    PT Comments    Pt was pleasant and motivated to participate during the session and put forth good effort throughout. Pt continued to require significant physical assistance with bed mobility tasks and to present with minor posterior instability in sitting at the EOB, although static sitting balance grossly improved from prior session.  Pt put forth great effort during multiple attempts to stand with posterior lean limiting pt's ability to generate force through his RLE.  LLE NWB status maintained throughout the session.  Pt will benefit from PT services in a SNF setting upon discharge to safely address deficits listed in patient problem list for decreased caregiver assistance and eventual return to PLOF.     Recommendations for follow up therapy are one component of a multi-disciplinary discharge planning process, led by the attending physician.  Recommendations may be updated based on patient status, additional functional criteria and insurance authorization.  Follow Up Recommendations  Skilled nursing-short term rehab (<3 hours/day) Can patient physically be transported by private vehicle: No   Assistance Recommended at Discharge Frequent or constant Supervision/Assistance  Patient can return home with the following A lot of help with  bathing/dressing/bathroom;Assistance with cooking/housework;Direct supervision/assist for medications management;Assist for transportation;Help with stairs or ramp for entrance;Direct supervision/assist for financial management;Two people to help with walking and/or transfers   Equipment Recommendations  Other (comment) (TBD at next venue of care)    Recommendations for Other Services       Precautions / Restrictions Precautions Precautions: Fall Restrictions Weight Bearing Restrictions: Yes LLE Weight Bearing: Non weight bearing Other Position/Activity Restrictions: L heel NWB per Dr. Vickki Muff 07/18/22     Mobility  Bed Mobility Overal bed mobility: Needs Assistance Bed Mobility: Supine to Sit, Sit to Supine     Supine to sit: Mod assist Sit to supine: Max assist   General bed mobility comments: Mod to max assist for trunk and BLE control with heavy cuing for sequencing    Transfers                   General transfer comment: multiple attempts to stand with assist and max cuing for sequencing with pt unable to clear the surface of the bed    Ambulation/Gait                   Stairs             Wheelchair Mobility    Modified Rankin (Stroke Patients Only)       Balance Overall balance assessment: Needs assistance   Sitting balance-Leahy Scale: Poor Sitting balance - Comments: occasional min A to prevent posterior LOB in sitting but grossly improved from prior session  Cognition Arousal/Alertness: Awake/alert Behavior During Therapy: WFL for tasks assessed/performed Overall Cognitive Status: Within Functional Limits for tasks assessed                                          Exercises Total Joint Exercises Ankle Circles/Pumps: AROM, Both, 10 reps, Strengthening (manual resistance to the RLE) Quad Sets: Strengthening, Both, 10 reps Gluteal Sets: Strengthening, Both, 10  reps Short Arc Quad: Strengthening, Both, 10 reps (with manual resistance) Heel Slides: Strengthening, Both, 10 reps, AAROM Hip ABduction/ADduction: AAROM, Strengthening, 10 reps, Both Straight Leg Raises: AAROM, Strengthening, 10 reps, Both Long Arc Quad: Strengthening, Both, 15 reps Knee Flexion: Strengthening, Both, 15 reps Other Exercises Other Exercises: RLE supine leg press x 10 with manual resistance Other Exercises: Anterior weight shifting in sitting to address posterior instability    General Comments        Pertinent Vitals/Pain Pain Assessment Pain Assessment: No/denies pain    Home Living                          Prior Function            PT Goals (current goals can now be found in the care plan section) Progress towards PT goals: Progressing toward goals    Frequency    7X/week      PT Plan Current plan remains appropriate    Co-evaluation              AM-PAC PT "6 Clicks" Mobility   Outcome Measure  Help needed turning from your back to your side while in a flat bed without using bedrails?: A Lot Help needed moving from lying on your back to sitting on the side of a flat bed without using bedrails?: A Lot Help needed moving to and from a bed to a chair (including a wheelchair)?: Total Help needed standing up from a chair using your arms (e.g., wheelchair or bedside chair)?: Total Help needed to walk in hospital room?: Total Help needed climbing 3-5 steps with a railing? : Total 6 Click Score: 8    End of Session   Activity Tolerance: Patient tolerated treatment well Patient left: in bed;with call bell/phone within reach;with bed alarm set Nurse Communication: Mobility status;Weight bearing status PT Visit Diagnosis: Difficulty in walking, not elsewhere classified (R26.2);Muscle weakness (generalized) (M62.81)     Time: [id number] PT Time Calculation (min) (ACUTE ONLY): 28 min  Charges:  $Therapeutic Exercise: 8-22  mins $Therapeutic Activity: 8-22 mins           D. Scott Therin Vetsch PT, DPT 07/19/22, 11:52 AM

## 2022-07-19 NOTE — TOC Progression Note (Addendum)
Transition of Care [facility]) - Progression Note    Patient Details  Name: Charles Jennings MRN: [id number] Date of Birth: 1928-04-13  Transition of Care [facility]) CM/SW [place], RN Phone Number: 07/19/2022, 2:18 PM  Clinical Narrative:    Met with the patient and [name] in the room, We reviewed the bed offers for STR, They chose [facility], I explained he would need a wound Vac.  I called and left a Secure VM at [facility] with Juliann Pulse and sent secure message to [place] at [facility] asking what brand of Wound Vac they use so that we can make sure the dressing is comparable or get a transitional dressing order in place until they get to the facility to get the new wound vac in place.  Awaiting a call back  Update, they will order a Wound vac and  hope it will be there tomorrow  Expected Discharge Plan: Skilled Nursing Facility Barriers to Discharge: Continued Medical Work up  Expected Discharge Plan and Services   Discharge Planning Services: CM Consult Post Acute Care Choice: [place] Living arrangements for the past 2 months: Apartment                 DME Arranged: N/A DME Agency: NA       HH Arranged: NA [place] Agency: NA         Social Determinants of Health (SDOH) Interventions SDOH Screenings   Food Insecurity: No Food Insecurity (07/12/2022)  Housing: Low Risk  (07/12/2022)  Transportation Needs: No Transportation Needs (07/12/2022)  Utilities: Not At Risk (07/12/2022)  Tobacco Use: Low Risk  (07/17/2022)  Recent Concern: Tobacco Use - Medium Risk (07/12/2022)    Readmission Risk Interventions     No data to display

## 2022-07-19 NOTE — Progress Notes (Signed)
Progress Note    07/19/2022 3:46 PM 3 Days Post-Op  Subjective:   POD #5 status post mechanical thrombectomy of the left SFA, popliteal and tibial vessels with stent placement of the SFA and above-knee popliteal artery. Toes and foot remain painful.  His leg feels better. Leg less red today. No other complaints overnight. Vitals all remain stable.   He is also POD #3 by Podiatry from amputation left second toe with I&D of dorsal left foot.  Intraoperatively large abscess was noted on the dorsal foot with a large area of necrotic tissue.  Excision of this material and the area was flushed.  Dorsal wound was left open and covered with nonadherent bandage.  Amputation site was closed distally.    Vitals:   07/19/22 0005 07/19/22 0743  BP: 139/61 (!) 140/60  Pulse: 79 76  Resp: 16 15  Temp: 98.7 F (37.1 C) 97.7 F (36.5 C)  SpO2: 97% 93%   Physical Exam: Cardiac:  RRR Lungs:  Normal respiratory effort. Diminished in the bases. No wheezing or crackles noted.  Incisions:  Right groin dressing was changed today. New dressing applied is clean dry and intact.   Extremities:   Left lower extremity warm to touch. Great toe blanches through the Tegaderm. Cellulitis is improving. Wound vac on foot and working appropriately.   Abdomen:   Positive bowel sounds, soft, flat, NT/ND  Neurologic: A&OX3 Neuro Intact.  Answers all my questions appropriately  CBC    Component Value Date/Time   WBC 8.9 07/19/2022 0346   RBC 4.60 07/19/2022 0346   HGB 13.1 07/19/2022 0346   HGB 15.5 01/22/2013 1546   HCT 40.0 07/19/2022 0346   HCT 45.4 01/22/2013 1546   PLT 228 07/19/2022 0346   PLT 203 01/22/2013 1546   MCV 87.0 07/19/2022 0346   MCV 88 01/22/2013 1546   MCH 28.5 07/19/2022 0346   MCHC 32.8 07/19/2022 0346   RDW 14.1 07/19/2022 0346   RDW 13.9 01/22/2013 1546   LYMPHSABS 1.7 07/15/2022 2136   MONOABS 1.7 (H) 07/15/2022 2136   EOSABS 0.0 07/15/2022 2136   BASOSABS 0.1 07/15/2022 2136     BMET    Component Value Date/Time   NA 137 07/19/2022 0346   NA 137 01/22/2013 1546   K 3.5 07/19/2022 0346   K 3.9 01/22/2013 1546   CL 104 07/19/2022 0346   CL 104 01/22/2013 1546   CO2 27 07/19/2022 0346   CO2 28 01/22/2013 1546   GLUCOSE 184 (H) 07/19/2022 0346   GLUCOSE 211 (H) 01/22/2013 1546   BUN 13 07/19/2022 0346   BUN 18 01/22/2013 1546   CREATININE 0.80 07/19/2022 0346   CREATININE 1.35 (H) 01/22/2013 1546   CALCIUM 7.3 (L) 07/19/2022 0346   CALCIUM 8.9 01/22/2013 1546   GFRNONAA >60 07/19/2022 0346   GFRNONAA 48 (L) 01/22/2013 1546   GFRAA >60 01/28/2016 1259   GFRAA 56 (L) 01/22/2013 1546    INR    Component Value Date/Time   INR 1.1 07/12/2022 1201     Intake/Output Summary (Last 24 hours) at 07/19/2022 1546 Last data filed at 07/19/2022 0758 Gross per 24 hour  Intake --  Output 700 ml  Net -700 ml     Assessment/Plan:  89 y.o. male is POD #5 from  mechanical thrombectomy of the left SFA, popliteal and tibial vessels with stent placement of the SFA and above-knee popliteal artery   He is also POD #3 today from toe amputation.   3  Days Post-Op   PLAN:  Patient is recovering and healing as expected.  He has excellent blood flow to the ankle.  No further interventions planned at this time.  Okay to discharge to [facility] with wound vac and follow up in one month with duplex ultrasound and ABI's Patient to go to [facility] on ASA 81 mg Daily and Eliquis 2.5 mg BID   DVT prophylaxis:  ASA 81 MG and Eliquis 2.5 MG BID   Marvelous Bouwens R [facility] 07/19/2022 3:46 PM

## 2022-07-20 DIAGNOSIS — M869 Osteomyelitis, unspecified: Secondary | ICD-10-CM | POA: Diagnosis not present

## 2022-07-20 DIAGNOSIS — I96 Gangrene, not elsewhere classified: Secondary | ICD-10-CM | POA: Diagnosis not present

## 2022-07-20 DIAGNOSIS — I739 Peripheral vascular disease, unspecified: Secondary | ICD-10-CM

## 2022-07-20 LAB — BASIC METABOLIC PANEL
Anion gap: 6 (ref 5–15)
BUN: 10 mg/dL (ref 8–23)
CO2: 26 mmol/L (ref 22–32)
Calcium: 6.9 mg/dL — ABNORMAL LOW (ref 8.9–10.3)
Chloride: 106 mmol/L (ref 98–111)
Creatinine, Ser: 0.67 mg/dL (ref 0.61–1.24)
GFR, Estimated: >60 mL/min (ref >60)
Glucose, Bld: 172 mg/dL — ABNORMAL HIGH (ref 70–99)
Potassium: 3.3 mmol/L — ABNORMAL LOW (ref 3.5–5.1)
Sodium: 138 mmol/L (ref 135–145)

## 2022-07-20 LAB — CBC
HCT: 36.3 % — ABNORMAL LOW (ref 39.0–52.0)
Hemoglobin: 11.9 g/dL — ABNORMAL LOW (ref 13.0–17.0)
MCH: 28.7 pg (ref 26.0–34.0)
MCHC: 32.8 g/dL (ref 30.0–36.0)
MCV: 87.7 fL (ref 80.0–100.0)
Platelets: 203 10*3/uL (ref 150–400)
RBC: 4.14 MIL/uL — ABNORMAL LOW (ref 4.22–5.81)
RDW: 13.9 % (ref 11.5–15.5)
WBC: 6.7 10*3/uL (ref 4.0–10.5)
nRBC: 0 % (ref 0.0–0.2)

## 2022-07-20 LAB — GLUCOSE, CAPILLARY
Glucose-Capillary: 159 mg/dL — ABNORMAL HIGH (ref 70–99)
Glucose-Capillary: 185 mg/dL — ABNORMAL HIGH (ref 70–99)
Glucose-Capillary: 188 mg/dL — ABNORMAL HIGH (ref 70–99)
Glucose-Capillary: 120 mg/dL — ABNORMAL HIGH (ref 70–99)

## 2022-07-20 MED ORDER — POTASSIUM CHLORIDE CRYS ER 20 MEQ PO TBCR
40.0000 meq | EXTENDED_RELEASE_TABLET | Freq: Once | ORAL | Status: AC
  Administered 2022-07-20: 40 meq via ORAL
  Filled 2022-07-20: qty 2

## 2022-07-20 MED ORDER — DOXYCYCLINE HYCLATE 100 MG PO TABS
100.0000 mg | ORAL_TABLET | Freq: Two times a day (BID) | ORAL | Status: DC
  Administered 2022-07-21: 100 mg via ORAL
  Filled 2022-07-20: qty 1

## 2022-07-20 MED ORDER — LEVOFLOXACIN 500 MG PO TABS
500.0000 mg | ORAL_TABLET | Freq: Every day | ORAL | Status: DC
  Administered 2022-07-20: 500 mg via ORAL
  Filled 2022-07-20: qty 1

## 2022-07-20 NOTE — Progress Notes (Signed)
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    PT Comments    Pt was long sitting in bed upon arriving. Supportive daughter and granddaughter at bedside. MD then wound care team arrived during session. Pt is alert and O x 2. Was aware of NWB restrictions and does put great effort into maintaining throughout. PT continues to require mod assist to exit R side of bed. Increased time + vcs for all mobility and transfers, He stood 3 x total from elevated bed height. On third STS performed pivot to recliner towards the R. Max assist + max vcs during transfers. Pt does well overall with maintaining NWB restrictions. Has posterior push at times but with vcs for posture correction and slowing down, was able to stand ~ 10-15 secs each time. Overall pt is progressing. He is far from his baseline abilities. Recommend +2 assist for all transfers. Will return to assist pt getting back to bed later this date. Recommend DC to SNF when cleared medically.   Recommendations for follow up therapy are one component of a multi-disciplinary discharge planning process, led by the attending physician.  Recommendations may be updated based on patient status, additional functional criteria and insurance authorization.  Follow Up Recommendations  Skilled nursing-short term rehab (<3 hours/day) Can patient physically be transported by  private vehicle: No   Assistance Recommended at Discharge Frequent or constant Supervision/Assistance  Patient can return home with the following A lot of help with bathing/dressing/bathroom;Assistance with cooking/housework;Direct supervision/assist for medications management;Assist for transportation;Help with stairs or ramp for entrance;Direct supervision/assist for financial management;Two people to help with walking and/or transfers   Equipment Recommendations  Other (comment) (defer to next level of care)       Precautions / Restrictions Precautions Precautions: Fall Restrictions Weight Bearing Restrictions: Yes LLE Weight Bearing: Non weight bearing Other Position/Activity Restrictions: L heel NWB per Dr. Vickki Muff 07/18/22     Mobility  Bed Mobility Overal bed mobility: Needs Assistance Bed Mobility: Supine to Sit, Sit to Supine  Supine to sit: Mod assist  General bed mobility comments: mod assist + increased time. needed cues to focus on desired task throughout. required HHA (mod) only. was able to progress BLEs to EOB without physical assistance.    Transfers Overall transfer level: Needs assistance Equipment used: Rolling walker (2 wheels) Transfers: Bed to chair/wheelchair/BSC, Sit to/from Stand Sit to Stand: Max assist, From elevated surface Stand pivot transfers: Max assist, From elevated surface   General transfer comment: Recommend +2 assistance for future transfers. Pt did stand 2 x EOB prior to stand pivot to recliner. Does well with adhering to NWB with vcing. Constant encouragement for increased fwd wt shift    Ambulation/Gait  General Gait Details: Pt unable to advance either LE while in standing but was able to pivot on RLE with max assist. pt is anxious when in standing.    Balance Overall balance assessment: Needs assistance Sitting-balance support: Feet supported  Sitting balance-Leahy Scale: Good Sitting balance - Comments: pt sat EOB x ~ 8 minutes with  supervision only   Standing balance support: Bilateral upper extremity supported, Reliant on assistive device for balance Standing balance-Leahy Scale: Poor Standing balance comment: Constant max assist in standing to prevent posterior LOB       Cognition Arousal/Alertness: Awake/alert Behavior During Therapy: WFL for tasks assessed/performed Overall Cognitive Status: Within Functional Limits for tasks assessed Area of Impairment: Safety/judgement, Awareness      Orientation Level: Disoriented to, Time, Situation   Memory: Decreased short-term memory, Decreased recall of precautions Following Commands: Follows one step commands consistently       General Comments: Pt is A and very cooperative. Does get emotional at times but is easily redirected. Pt did follow commands throughout with increased time           General Comments General comments (skin integrity, edema, etc.): Wound care team arrived during session. will review ther ex with pt in future sessions      Pertinent Vitals/Pain Pain Assessment Pain Assessment: Faces Faces Pain Scale: Hurts little more Pain Location: LLE(foot) Pain Descriptors / Indicators: Discomfort Pain Intervention(s): Limited activity within patient's tolerance, Monitored during session, Premedicated before session, Repositioned     PT Goals (current goals can now be found in the care plan section) Acute Rehab PT Goals Patient Stated Goal: To get stronger Progress towards PT goals: Progressing toward goals    Frequency    7X/week      PT Plan Current plan remains appropriate       AM-PAC PT "6 Clicks" Mobility   Outcome Measure  Help needed turning from your back to your side while in a flat bed without using bedrails?: A Lot Help needed moving from lying on your back to sitting on the side of a flat bed without using bedrails?: A Lot Help needed moving to and from a bed to a chair (including a wheelchair)?: A Lot Help needed  standing up from a chair using your arms (e.g., wheelchair or bedside chair)?: Total Help needed to walk in hospital room?: Total Help needed climbing 3-5 steps with a railing? : Total 6 Click Score: 9    End of Session Equipment Utilized During Treatment: Gait belt Activity Tolerance: Patient tolerated treatment well Patient left: in chair;with call bell/phone within reach;with chair alarm set;with family/visitor present;with nursing/sitter in room Nurse Communication: Mobility status;Weight bearing status PT Visit Diagnosis: Difficulty in walking, not elsewhere classified (R26.2);Muscle weakness (generalized) (M62.81)     Time: [id number] PT Time Calculation (min) (ACUTE ONLY): 18 min  Charges:  $Therapeutic Activity: 8-22 mins                     Julaine Fusi PTA 07/20/22, 10:36 AM

## 2022-07-20 NOTE — Progress Notes (Signed)
[facility] Progress Note  Patient: Charles Jennings    [id number]  DOA: 07/12/2022     Date of Service: the patient was seen and examined on 07/20/2022  Chief Complaint  Patient presents with   Foot Problem   Brief hospital course: 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   Initial vitals in the ED showed temperature of 97.6, respiration rate of 18, heart rate 76, blood pressure 165/67, SpO2 of 96% on room air.   Serum sodium is 131, potassium 4.5, chloride 90, bicarb 22, BUN of 53, serum creatinine 1.36, nonfasting blood glucose 242, EGFR 49, WBC 16.8, hemoglobin 17.1, platelets of 211.   Anion gap was elevated at 19.  Alk phos elevated at 212.   ED treatment: Cefepime, vancomycin, metronidazole.  He reports he saw Dr. Cleda Mccreedy, and was advised to come to the ED. He reports the left foot has been bothering him for over one year. He states this was a regular scheduled visit.    Assessment and Plan: Principal Problem:   Osteomyelitis ([place]) Active Problems:   Limb ischemia   Insulin dependent type 2 diabetes mellitus (HCC)   Mixed hyperlipidemia   Benign essential HTN   Stage 3a chronic kidney disease (CKD) (HCC)   Weakness   LLE peripheral artery disease Vascular surgery consulted, On 2/8 s/p angiogram, mechanical thrombectomy of left SFA pop with popliteal artery and tibial peroneal trunk. S/p tPA at the time of procedure, patient was started on continuous tPA infusion overnight, right femoral triple-lumen catheter was placed. On 2/9  Left lower extremity angiogram, Mechanical thrombectomy of the left SFA, popliteal artery, tibioperoneal trunk, and proximal posterior tibial artery with the penumbra CAT 6 device. Stent placement to  the left distal SFA and above-knee popliteal artery with 6 mm diameter by 15 cm length Viabahn stent for residual thrombus and stenosis after thrombectomy. StarClose closure device right femoral artery S/p Heparin IV infusion, transition to Eliquis 2.5 mg p.o. twice daily and aspirin 81 mg p.o. daily as per vascular surgery. Follow vascular surgery for further recommendation  # Osteomyelitis of Left third toe With possible cellulitis at the site of left foot amputation Podiatry consulted, on 2/11 s/p amputation left second toeMTP J and Excisional debridement necrotic tissue and abscess to and including tendon and muscle dorsal left foot Ankle block for postoperative anesthetic left ankle  S/p cefepime and Flagyl, discontinued on 2/12 due to possible toxicity as patient was having paranoid thoughts and delirium S/p vancomycin, transition to Zyvox 6 mg p.o. twice daily and continued Unasyn Blood cultures x 2 NGTD S/p IVF for hydration 2/11 wound culture from the OR growing MRSA ID recommendation appreciated.  We will follow ID for duration of treatment. 2/15 podiatry will look at the wound tomorrow, after removing the wound VAC.  ID will decide for antibiotics tomorrow a.m. after looking at the wound.   Insulin dependent type 2 diabetes mellitus (HCC) - Home NovoLog 70/30, 10 units daily before breakfast not resumed on admission - Jardiance 10 mg daily, metformin 500 mg p.o. twice daily were not resumed on admission - Insulin SSI with at bedtime coverage ordered - Goal inpatient blood glucose levels 140-180   Mixed hyperlipidemia - Atorvastatin 40 mg daily   Benign essential HTN - Diltiazem 240 mg daily, lisinopril 40 mg daily resumed -  Hydralazine 5 mg IV every 8 hours as needed for SBP greater than 175, 4 days ordered   Weakness Patient need on the floor for 1 day due to extreme weakness, per daughter - CK 66 wnl, B12 level 563 wnl, TSH 3.17 wnl - PT and OT eval done, recommend SNF  placement   Stage 3a chronic kidney disease (CKD) (HCC) - Serum creatinine elevation, does not meet criteria for acute kidney injury, presumed secondary to poor p.o. intake Serum creatinine ranges 1.03-1.29-->0.88 S/p IVF  - Check BMP in a.m.  Isotonic hyponatremia most likely due to nutritional deficiency Serum osmolality 289, Sodium 132--138 Resolved   # Hypokalemia, potassium repleted. # Hypomagnesemia, mag repleted. # Hypophosphatemia, Phos repleted.   Monitor electrolytes and replete as needed.  Body mass index is 21.58 kg/m.  Interventions:       Diet: Diabetic carb modified diet DVT Prophylaxis: Subcutaneous Heparin    Advance goals of care discussion: Full code  Family Communication: family was present at bedside, at the time of interview.  The pt provided permission to discuss medical plan with the family. Opportunity was given to ask question and all questions were answered satisfactorily.   Disposition:  Pt is from Home, admitted with left toe osteomyelitis, vascular surgery and podiatry consulted, s/p LLE angiogram, thrombectomy.  S/p amputation left 2nd toe, which precludes a safe discharge. Discharge to SNF as per  PT/OT eval, when medically stable and cleared by podiatry, vascular surgery and ID.  Subjective: No significant events overnight, patient is having off-and-on throbbing pain in the foot, but overall feels good. No active issues  Physical Exam: General: NAD, lying comfortably Appear in no distress, affect appropriate Eyes: PERRLA ENT: Oral Mucosa Clear, moist  Neck: no JVD,  Cardiovascular: S1 and S2 Present, no Murmur,  Respiratory: good respiratory effort, Bilateral Air entry equal and Decreased, no Crackles, no wheezes Abdomen: Bowel Sound present, Soft and no tenderness,  Skin: no rashes Extremities: no Pedal edema, no calf tenderness.  Left foot dressing CDI, wound VAC attached Neurologic: without any new focal findings Gait not checked  due to patient safety concerns  Vitals:   07/19/22 0743 07/19/22 1607 07/19/22 2258 07/20/22 0807  BP: (!) 140/60 (!) 142/56 119/61 (!) 108/56  Pulse: 76 77 97 71  Resp: 15 15 15 15  $ Temp: 97.7 F (36.5 C) 98 F (36.7 C) 99 F (37.2 C) 97.7 F (36.5 C)  TempSrc:      SpO2: 93% 95% 98% (!) 89%  Weight:      Height:        Intake/Output Summary (Last 24 hours) at 07/20/2022 1202 Last data filed at 07/20/2022 1036 Gross per 24 hour  Intake 3373.74 ml  Output 1375 ml  Net 1998.74 ml   Filed Weights   07/12/22 1104 07/13/22 1930 07/15/22 0000  Weight: 62.5 kg 55 kg 65.5 kg    Data Reviewed: I have personally reviewed and interpreted daily labs, tele strips, imagings as discussed above. I reviewed all nursing notes, pharmacy notes, vitals, pertinent old records I have discussed plan of care as described above with RN and patient/family.  CBC: Recent Labs  Lab 07/15/22 2136 07/17/22 1000 07/18/22 0534 07/19/22 0346 07/20/22 0540  WBC 14.1* 11.6* 7.3 8.9 6.7  NEUTROABS 10.5*  --   --   --   --   HGB 12.2* 14.4 13.6 13.1 11.9*  HCT 36.4* 42.9 41.3 40.0 36.3*  MCV 87.7 86.3 87.3 87.0 87.7  PLT 148* 217  204 228 [id number]   Basic Metabolic Panel: Recent Labs  Lab 07/15/22 0542 07/15/22 2135 07/15/22 2136 07/16/22 0511 07/17/22 1000 07/18/22 0534 07/19/22 0346 07/20/22 0540  NA 138   < >  --  138 138 137 137 138  K 4.3   < >  --  4.3 3.7 3.6 3.5 3.3*  CL 105   < >  --  101 106 103 104 106  CO2 20*   < >  --  31 23 24 27 26  $ GLUCOSE 177*   < >  --  222* 186* 155* 184* 172*  BUN 28*   < >  --  28* 19 15 13 10  $ CREATININE 0.99   < >  --  0.88 0.86 0.79 0.80 0.67  CALCIUM 8.1*   < >  --  7.5* 7.3* 7.3* 7.3* 6.9*  MG 2.0  --  1.9  --  1.6* 1.8 1.8  --   PHOS 3.2  --  1.5* 3.0 1.9* 2.2* 2.6  --    < > = values in this interval not displayed.    Studies: No results found.  Scheduled Meds:  apixaban  2.5 mg Oral BID   aspirin EC  81 mg Oral Daily   atorvastatin  40  mg Oral QPM   Chlorhexidine Gluconate Cloth  6 each Topical Daily   diltiazem  240 mg Oral Daily   gabapentin  300 mg Oral Daily   insulin aspart  0-5 Units Subcutaneous QHS   insulin aspart  0-9 Units Subcutaneous TID WC   linezolid  600 mg Oral Q12H   lisinopril  40 mg Oral Daily   Continuous Infusions:  sodium chloride 75 mL/hr at 07/20/22 0948   sodium chloride Stopped (07/17/22 1621)   ampicillin-sulbactam (UNASYN) IV 3 g (07/20/22 0857)   PRN Meds: sodium chloride, hydrALAZINE, hydrALAZINE, ondansetron (ZOFRAN) IV, mouth rinse, oxyCODONE, senna-docusate  Time spent: 35 minutes  Author: Val Riles. MD [facility] 07/20/2022 12:02 PM  To reach On-call, see care teams to locate the attending and reach out to them via [email or web address]. If 7PM-7AM, please contact night-coverage If you still have difficulty reaching the attending provider, please page the [facility] (Director on Call) for [facility] on amion for assistance.

## 2022-07-20 NOTE — Plan of Care (Signed)
  Problem: Education: Goal: Knowledge of General Education information will improve Description: Including pain rating scale, medication(s)/side effects and non-pharmacologic comfort measures Outcome: Progressing   Problem: Health Behavior/Discharge Planning: Goal: Ability to manage health-related needs will improve Outcome: Progressing   Problem: Clinical Measurements: Goal: Ability to maintain clinical measurements within normal limits will improve Outcome: Progressing Goal: Will remain free from infection Outcome: Progressing Goal: Diagnostic test results will improve Outcome: Progressing Goal: Respiratory complications will improve Outcome: Progressing Goal: Cardiovascular complication will be avoided Outcome: Progressing   Problem: Activity: Goal: Risk for activity intolerance will decrease Outcome: Progressing   Problem: Nutrition: Goal: Adequate nutrition will be maintained Outcome: Progressing   Problem: Coping: Goal: Level of anxiety will decrease Outcome: Progressing   Problem: Elimination: Goal: Will not experience complications related to bowel motility Outcome: Progressing Goal: Will not experience complications related to urinary retention Outcome: Progressing   Problem: Pain Managment: Goal: General experience of comfort will improve Outcome: Progressing   Problem: Safety: Goal: Ability to remain free from injury will improve Outcome: Progressing   Problem: Skin Integrity: Goal: Risk for impaired skin integrity will decrease Outcome: Progressing   Problem: Education: Goal: Ability to describe self-care measures that may prevent or decrease complications (Diabetes Survival Skills Education) will improve Outcome: Progressing Goal: Individualized Educational Video(s) Outcome: Progressing   Problem: Coping: Goal: Ability to adjust to condition or change in health will improve Outcome: Progressing   Problem: Fluid Volume: Goal: Ability to  maintain a balanced intake and output will improve Outcome: Progressing   Problem: Health Behavior/Discharge Planning: Goal: Ability to identify and utilize available resources and services will improve Outcome: Progressing Goal: Ability to manage health-related needs will improve Outcome: Progressing   Problem: Metabolic: Goal: Ability to maintain appropriate glucose levels will improve Outcome: Progressing   Problem: Nutritional: Goal: Maintenance of adequate nutrition will improve Outcome: Progressing Goal: Progress toward achieving an optimal weight will improve Outcome: Progressing   Problem: Skin Integrity: Goal: Risk for impaired skin integrity will decrease Outcome: Progressing   Problem: Tissue Perfusion: Goal: Adequacy of tissue perfusion will improve Outcome: Progressing   Problem: Education: Goal: Ability to identify signs and symptoms of gastrointestinal bleeding will improve Outcome: Progressing   Problem: Bowel/Gastric: Goal: Will show no signs and symptoms of gastrointestinal bleeding Outcome: Progressing   Problem: Fluid Volume: Goal: Will show no signs and symptoms of excessive bleeding Outcome: Progressing   Problem: Clinical Measurements: Goal: Complications related to the disease process, condition or treatment will be avoided or minimized Outcome: Progressing   Problem: Education: Goal: Knowledge of the prescribed therapeutic regimen will improve Outcome: Progressing Goal: Ability to verbalize activity precautions or restrictions will improve Outcome: Progressing Goal: Understanding of discharge needs will improve Outcome: Progressing   Problem: Activity: Goal: Ability to perform//tolerate increased activity and mobilize with assistive devices will improve Outcome: Progressing   Problem: Clinical Measurements: Goal: Postoperative complications will be avoided or minimized Outcome: Progressing   Problem: Self-Care: Goal: Ability to meet  self-care needs will improve Outcome: Progressing   Problem: Self-Concept: Goal: Ability to maintain and perform role responsibilities to the fullest extent possible will improve Outcome: Progressing   Problem: Pain Management: Goal: Pain level will decrease with appropriate interventions Outcome: Progressing

## 2022-07-20 NOTE — Progress Notes (Signed)
Date of Admission:  07/12/2022     ID: Charles Jennings is a 89 y.o. male  Principal Problem:   Osteomyelitis ([place]) Active Problems:   Benign essential HTN   Insulin dependent type 2 diabetes mellitus ([place])   Mixed hyperlipidemia   Limb ischemia   Stage 3a chronic kidney disease (CKD) (HCC)   Weakness   Gangrene of toe (HCC)   Paranoia ([place])   Charles Jennings is a 89 y.o. male with a history of DM,peripheral neuropathy , HTN, CAD, PAD presented to ED  on 07/12/22 with left foot 2nd toe gangrene . He was followed by podiatrist who advised him to go to the ED.  Pt says in Nov 2023  he had his toe nails clipped at the podiatrist office and  one was clipped close to the skin and got infected and  was hard to heal- He came to the ED on 06/11/22 and noted to have necrotic tip of the 2nd toe-           He underwent angio and PTLA of Left SFA and stent placement to left SFA and popliteal artery on 06/12/22 On 06/14/22 he had another angio and kissing stent placement to bilateral common iliac arteries and was dc on 06/15/22 to follow up with podiatrist and to observe for demarcation of gangrene On 06/30/22 he underwent amputation of the 3 rd toe He is now admitted with progressive gangrene of the 2 nd toe and swelling leg In the Ed vitals on 2/7 were   07/12/22 11:01  BP 115/70  Temp 97.5 F (36.4 C) !  Pulse Rate 92  Resp 18  SpO2 100 %      Latest Reference Range & Units 07/12/22 11:30  WBC 4.0 - 10.5 K/uL 16.8 (H)  Hemoglobin 13.0 - 17.0 g/dL 17.1 (H)  HCT 39.0 - 52.0 % 51.7  Platelets 150 - 400 K/uL 211  Creatinine 0.61 - 1.24 mg/dL 1.36 (H)   Started on vanco and unasyn On 07/13/22 underwent angio and mechanical thrombectomy of left SFA, popliteal artery , TPA on the left Underwent amputation/disarticulation  of the 2 nd toe at the MTP joint on 07/16/22. As the culture is MRSA and the bone pathology was acute osteo I am consulted   Pt was living by himself until this admission and  was independent of ADL  Subjective: Pt is feeling better No fever or chills Pain better  Medications:   apixaban  2.5 mg Oral BID   aspirin EC  81 mg Oral Daily   atorvastatin  40 mg Oral QPM   Chlorhexidine Gluconate Cloth  6 each Topical Daily   diltiazem  240 mg Oral Daily   gabapentin  300 mg Oral Daily   insulin aspart  0-5 Units Subcutaneous QHS   insulin aspart  0-9 Units Subcutaneous TID WC   linezolid  600 mg Oral Q12H   lisinopril  40 mg Oral Daily    Objective: Vital signs in last 24 hours: Temp:  [97.7 F (36.5 C)-99 F (37.2 C)] 97.7 F (36.5 C) (02/15 0807) Pulse Rate:  [71-97] 71 (02/15 0807) Resp:  [15] 15 (02/15 0807) BP: (108-142)/(56-61) 108/56 (02/15 0807) SpO2:  [89 %-98 %] 89 % (02/15 0807)    PHYSICAL EXAM:  General: Alert, cooperative, no distress,.  Head: Normocephalic, without obvious abnormality, atraumatic. Eyes: Conjunctivae clear, anicteric sclerae. Pupils are equal ENT Nares normal. No drainage or sinus tenderness. Lips, mucosa, and tongue normal. No Thrush  Neck:  symmetrical, no adenopathy, thyroid: non tender no carotid bruit and no JVD. Lungs: Clear to auscultation bilaterally. No Wheezing or Rhonchi. No rales. Heart: Regular rate and rhythm, no murmur, rub or gallop. Abdomen: Soft, non-tender,not distended. Bowel sounds normal. No masses Extremities: left foot wound vac Picture reviewed  Skin: No rashes or lesions. Or bruising Lymph: Cervical, supraclavicular normal. Neurologic: Grossly non-focal  Lab Results Recent Labs    07/19/22 0346 07/20/22 0540  WBC 8.9 6.7  HGB 13.1 11.9*  HCT 40.0 36.3*  NA 137 138  K 3.5 3.3*  CL 104 106  CO2 27 26  BUN 13 10  CREATININE 0.80 0.67   Microbiology: MRSA Proteus vulgaris    Assessment/Plan: Gangrene 2nd toe of left foot in a patient with PAD, DM, neuropathy. S/p amputation of 2nd toe on 07/16/22 MRSA and proteus vulgaris in  culture Pathology acute osteo, present at  the disarticulated edge Recent amputation of 3rd toe on 06/30/22 Pt currently on unasyn and Lineolid- Dc unasyn and start levaquin Communicated with Dr.Fowler- Therapeutic amputation hence IV antibiotic not needed on discharge. Will be discharged on Doxy 134m Po BID and levaquin 5010mQd for 2 weeks  PAD- s/p multiple angio Stent , mechanical thrombectomy   DM- on insulin, metformin and Jardiance   CAD   Pacemaker in place   Discussed the management with patient and care team Will follow him as OP

## 2022-07-20 NOTE — Progress Notes (Signed)
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   OT comments  Chart reviewed, co tx with PT for functional transfer from chair>bed. Pt is alert, oriented to self, place, date, grossly oriented to situation "I came because the doctor told me to come"; Pt throughout session. Tx session targeted improving functional tolerance for activity and transfers to improve ADL independence. +1-2 required for STS and SPT to bedside chair with MAX A and step by step vcs with RW. Grooming tasks completed with supervision. Pt positioned on his R side due to c/o sacral discomfort, redness noted on sacrum. Pt is left in bed, all needs met. Discharge recommendation remains appropriate. OT will continue to follow acutely.    Recommendations for follow up therapy are one component of a multi-disciplinary discharge planning process, led by the attending physician.  Recommendations may be updated based on patient status, additional functional criteria and insurance authorization.    Follow Up Recommendations  Skilled nursing-short term rehab (<3 hours/day)     Assistance Recommended at Discharge Frequent or constant Supervision/Assistance  Patient can return home with the following  A lot of help with walking and/or transfers;A lot of help with bathing/dressing/bathroom   Equipment Recommendations  Other (comment) (per next  venue of care)    Recommendations for Other Services      Precautions / Restrictions Precautions Precautions: Fall Restrictions Weight Bearing Restrictions: Yes LLE Weight Bearing: Non weight bearing Other Position/Activity Restrictions: L heel NWB per Dr. Vickki Muff 07/18/22       Mobility Bed Mobility Overal bed mobility: Needs Assistance Bed Mobility: Sit to Supine, Rolling Rolling: Min assist     Sit to supine: Mod assist, +2 for safety/equipment   General bed mobility comments: increased time, pt is fatigued from upright sitting in chair    Transfers Overall transfer level: Needs assistance Equipment used: Rolling walker (2 wheels) Transfers: Sit to/from Stand Sit to Stand: Max assist, +2 safety/equipment, From elevated surface Stand pivot transfers: Max assist         General transfer comment: pt aware and reporting NWBing status to LLE, step by step vcs for body mechanics     Balance Overall balance assessment: Needs assistance Sitting-balance support: Feet supported Sitting balance-Leahy Scale: Good   Postural control: Posterior lean Standing balance support: Bilateral upper extremity supported, Reliant on assistive device for balance Standing balance-Leahy Scale: Poor                             ADL either performed or assessed with clinical judgement   ADL Overall ADL's : Needs assistance/impaired Eating/Feeding: Set up;Sitting   Grooming: Wash/dry hands;Wash/dry face;Set up;Supervision/safety               Lower Body Dressing: Maximal assistance                      Extremity/Trunk Assessment  Vision       Perception     Praxis      Cognition Arousal/Alertness: Awake/alert Behavior During Therapy: WFL for tasks assessed/performed, Flat affect Overall Cognitive Status: Impaired/Different from baseline Area of Impairment: Safety/judgement, Awareness, Attention, Following commands                      Memory: Decreased short-term memory, Decreased recall of precautions Following Commands: Follows one step commands consistently, Follows one step commands with increased time Safety/Judgement: Decreased awareness of safety Awareness: Emergent Problem Solving: Slow processing, Requires verbal cues, Requires tactile cues                     Pertinent Vitals/ Pain       Pain Assessment Pain Assessment: Faces Faces Pain Scale: Hurts little more Pain Location: L foot Pain Descriptors / Indicators: Discomfort, Grimacing Pain Intervention(s): Limited activity within patient's tolerance, Monitored during session, Repositioned   Frequency  Min 2X/week        Progress Toward Goals  OT Goals(current goals can now be found in the care plan section)  Progress towards OT goals: Progressing toward goals     Plan Discharge plan remains appropriate;Frequency remains appropriate    Co-evaluation    PT/OT/SLP Co-Evaluation/Treatment: Yes Reason for Co-Treatment: To address functional/ADL transfers;For patient/therapist safety;Complexity of the patient's impairments (multi-system involvement)          AM-PAC OT "6 Clicks" Daily Activity     Outcome Measure   Help from another person eating meals?: None Help from another person taking care of personal grooming?: A Little Help from another person toileting, which includes using toliet, bedpan, or urinal?: A Lot Help from another person bathing (including washing, rinsing, drying)?: A Lot Help from another person to put on and taking off regular upper body clothing?: A Little Help from another person to put on and taking off regular lower body clothing?: A Lot 6 Click Score: 16    End of Session Equipment Utilized During Treatment: Rolling walker (2 wheels)  OT Visit Diagnosis: Unsteadiness on feet (R26.81);Other abnormalities of gait and mobility (R26.89);History of falling (Z91.81);Muscle weakness (generalized)  (M62.81)   Activity Tolerance Patient tolerated treatment well   Patient Left in bed;with call bell/phone within reach;with bed alarm set   Nurse Communication Mobility status        Time: [id number] OT Time Calculation (min): 22 min  Charges: OT General Charges $OT Visit: 1 Visit OT Treatments $Therapeutic Activity: 8-22 mins  Shanon Payor, OTD OTR/L  07/20/22, 1:46 PM

## 2022-07-20 NOTE — TOC Progression Note (Signed)
Transition of Care [facility]) - Progression Note    Patient Details  Name: KONG BURKEEN MRN: [id number] Date of Birth: 1928-04-28  Transition of [facility]) CM/SW Contact  Conception Oms, RN Phone Number: 07/20/2022, 9:22 AM  Clinical Narrative:     Smitty Pluck at [facility] and inquired about the wound vac, he stated they are anticipating it to be delivered to the facility today and he will let me know when it comes  Expected Discharge Plan: [place] Barriers to Discharge: Continued Medical Work up  Expected Discharge Plan and Services   Discharge Planning Services: CM Consult Post Acute Care Choice: [place] Living arrangements for the past 2 months: Apartment                 DME Arranged: N/A DME Agency: NA       HH Arranged: NA [place] Agency: NA         Social Determinants of Health (SDOH) Interventions SDOH Screenings   Food Insecurity: No Food Insecurity (07/12/2022)  Housing: Low Risk  (07/12/2022)  Transportation Needs: No Transportation Needs (07/12/2022)  Utilities: Not At Risk (07/12/2022)  Tobacco Use: Low Risk  (07/17/2022)  Recent Concern: Tobacco Use - Medium Risk (07/12/2022)    Readmission Risk Interventions     No data to display

## 2022-07-20 NOTE — TOC Progression Note (Signed)
Transition of Care [facility]) - Progression Note    Patient Details  Name: Charles Jennings MRN: [id number] Date of Birth: 1928/02/06  Transition of Care [facility]) CM/SW [place], RN Phone Number: 07/20/2022, 2:27 PM  Clinical Narrative:    Audry Pili called and let me know The facility has the wound vac, he will need to have a wet to dry dressing because it is not a [employer] brand and the hookups wont work, They will need to have the patient in the building to get the wound vac hooked up by 5 PM, needing the DC summary by 330 Otherwise he will need to wait until tomorrow  Expected Discharge Plan: [place] Barriers to Discharge: Continued Medical Work up  Expected Discharge Plan and Services   Discharge Planning Services: CM Consult Post Acute Care Choice: [place] Living arrangements for the past 2 months: Apartment                 DME Arranged: N/A DME Agency: NA       HH Arranged: NA [place] Agency: NA         Social Determinants of Health (SDOH) Interventions SDOH Screenings   Food Insecurity: No Food Insecurity (07/12/2022)  Housing: Low Risk  (07/12/2022)  Transportation Needs: No Transportation Needs (07/12/2022)  Utilities: Not At Risk (07/12/2022)  Tobacco Use: Low Risk  (07/17/2022)  Recent Concern: Tobacco Use - Medium Risk (07/12/2022)    Readmission Risk Interventions     No data to display

## 2022-07-20 NOTE — Progress Notes (Signed)
Daily Progress Note   Subjective  - 4 Days Post-Op  Follow-up I&D debridement of left foot with second toe amputation.  Patient doing well.  Objective Vitals:   07/19/22 1607 07/19/22 2258 07/20/22 0807 07/20/22 1631  BP: (!) 142/56 119/61 (!) 108/56 (!) 134/59  Pulse: 77 97 71 72  Resp: 15 15 15 16  $ Temp: 98 F (36.7 C) 99 F (37.2 C) 97.7 F (36.5 C) 97.7 F (36.5 C)  TempSrc:      SpO2: 95% 98% (!) 89% 100%  Weight:      Height:        Physical Exam: Wound is stable.  Exposed tendon with granular tissue.  Erythema is dramatically improved.  No foul odor or purulent drainage.  Laboratory CBC    Component Value Date/Time   WBC 6.7 07/20/2022 0540   HGB 11.9 (L) 07/20/2022 0540   HGB 15.5 01/22/2013 1546   HCT 36.3 (L) 07/20/2022 0540   HCT 45.4 01/22/2013 1546   PLT 203 07/20/2022 0540   PLT 203 01/22/2013 1546    BMET    Component Value Date/Time   NA 138 07/20/2022 0540   NA 137 01/22/2013 1546   K 3.3 (L) 07/20/2022 0540   K 3.9 01/22/2013 1546   CL 106 07/20/2022 0540   CL 104 01/22/2013 1546   CO2 26 07/20/2022 0540   CO2 28 01/22/2013 1546   GLUCOSE 172 (H) 07/20/2022 0540   GLUCOSE 211 (H) 01/22/2013 1546   BUN 10 07/20/2022 0540   BUN 18 01/22/2013 1546   CREATININE 0.67 07/20/2022 0540   CREATININE 1.35 (H) 01/22/2013 1546   CALCIUM 6.9 (L) 07/20/2022 0540   CALCIUM 8.9 01/22/2013 1546   GFRNONAA >60 07/20/2022 0540   GFRNONAA 48 (L) 01/22/2013 1546   GFRAA >60 01/28/2016 1259   GFRAA 56 (L) 01/22/2013 1546    Assessment/Planning: Status post debridement for gangrene left second toe and necrotic tissue dorsal left foot with infection  Clinically the wound is stable at this time.  The wound VAC applied and patient is stable for discharge from podiatry standpoint.  Should start with 3 times a week dressing changes at skilled facility. Maintain nonweightbearing to the left foot as weightbearing will likely break the seal of the wound  VAC. Oral antibiotics would be appropriate.  I do not believe there is any further deep infection or osteomyelitis. Patient can follow-up with me in the podiatry office in 2 to 3 weeks.  Samara Deist A  07/20/2022, 5:50 PM

## 2022-07-20 NOTE — Plan of Care (Signed)
  Problem: Education: Goal: Knowledge of General Education information will improve Description: Including pain rating scale, medication(s)/side effects and non-pharmacologic comfort measures Outcome: Progressing   Problem: Health Behavior/Discharge Planning: Goal: Ability to manage health-related needs will improve Outcome: Progressing   Problem: Clinical Measurements: Goal: Ability to maintain clinical measurements within normal limits will improve Outcome: Progressing Goal: Diagnostic test results will improve Outcome: Progressing Goal: Cardiovascular complication will be avoided Outcome: Progressing   Problem: Activity: Goal: Risk for activity intolerance will decrease Outcome: Progressing   Problem: Nutrition: Goal: Adequate nutrition will be maintained Outcome: Progressing   Problem: Elimination: Goal: Will not experience complications related to bowel motility Outcome: Progressing Goal: Will not experience complications related to urinary retention Outcome: Progressing   Problem: Pain Managment: Goal: General experience of comfort will improve Outcome: Progressing   Problem: Safety: Goal: Ability to remain free from injury will improve Outcome: Progressing

## 2022-07-20 NOTE — Consult Note (Signed)
[place] Nurse wound follow up; change NPWT left anterior foot Wound type:surgical  Wound bed: 80% red 10% yellow fibrin 10% exposed tendon  Drainage (amount, consistency, odor) minimal serosanguinous  Periwound: maceration largely around remaining toes  Dressing procedure/placement/frequency: Removed old NPWT dressing Cleansed wound with normal saline.  Small piece of Mepitel placed over exposed tendon.  Folded gauze 2x2 placed between 4th and 5th digits.  Skin barrier ring placed around wound bed and around base of great toe.  Filled wound with  1 piece of black foam  Sealed NPWT dressing at 138m HG  Patient tolerated procedure well.  Gauze placed between patients skin and tubing as well as Kerlix roll gauze applied from above toes to above ankle to secure dressing.  Entire dressing secured with Ace bandage.  Patients foot is placed in Prevalon boot.     WAirmontnurse will continue to provide NPWT dressing changed due to the complexity of the dressing change. Per previous conversation with Dr. FVickki Muff [place] to change NPWT dressing again on 07/24/2022.     Thank you,    Laketta Soderberg MSN, RN-BC, [employer]

## 2022-07-21 DIAGNOSIS — M869 Osteomyelitis, unspecified: Secondary | ICD-10-CM | POA: Diagnosis not present

## 2022-07-21 LAB — MAGNESIUM: Magnesium: 1.4 mg/dL — ABNORMAL LOW (ref 1.7–2.4)

## 2022-07-21 LAB — GLUCOSE, CAPILLARY
Glucose-Capillary: 133 mg/dL — ABNORMAL HIGH (ref 70–99)
Glucose-Capillary: 228 mg/dL — ABNORMAL HIGH (ref 70–99)

## 2022-07-21 LAB — CULTURE, BLOOD (ROUTINE X 2)
Culture: NO GROWTH
Culture: NO GROWTH
Special Requests: ADEQUATE
Special Requests: ADEQUATE

## 2022-07-21 LAB — CBC
HCT: 37.8 % — ABNORMAL LOW (ref 39.0–52.0)
Hemoglobin: 12.6 g/dL — ABNORMAL LOW (ref 13.0–17.0)
MCH: 29.1 pg (ref 26.0–34.0)
MCHC: 33.3 g/dL (ref 30.0–36.0)
MCV: 87.3 fL (ref 80.0–100.0)
Platelets: 213 10*3/uL (ref 150–400)
RBC: 4.33 MIL/uL (ref 4.22–5.81)
RDW: 13.7 % (ref 11.5–15.5)
WBC: 6 10*3/uL (ref 4.0–10.5)
nRBC: 0 % (ref 0.0–0.2)

## 2022-07-21 LAB — BASIC METABOLIC PANEL
Anion gap: 7 (ref 5–15)
BUN: 8 mg/dL (ref 8–23)
CO2: 26 mmol/L (ref 22–32)
Calcium: 7 mg/dL — ABNORMAL LOW (ref 8.9–10.3)
Chloride: 100 mmol/L (ref 98–111)
Creatinine, Ser: 0.74 mg/dL (ref 0.61–1.24)
GFR, Estimated: >60 mL/min (ref >60)
Glucose, Bld: 145 mg/dL — ABNORMAL HIGH (ref 70–99)
Potassium: 3.3 mmol/L — ABNORMAL LOW (ref 3.5–5.1)
Sodium: 133 mmol/L — ABNORMAL LOW (ref 135–145)

## 2022-07-21 LAB — AEROBIC/ANAEROBIC CULTURE W GRAM STAIN (SURGICAL/DEEP WOUND)

## 2022-07-21 LAB — PHOSPHORUS: Phosphorus: 2.3 mg/dL — ABNORMAL LOW (ref 2.5–4.6)

## 2022-07-21 MED ORDER — LEVOFLOXACIN 500 MG PO TABS: 500.0000 mg | ORAL_TABLET | Freq: Every day | ORAL | 0 refills | Status: DC

## 2022-07-21 MED ORDER — POTASSIUM CHLORIDE CRYS ER 20 MEQ PO TBCR
40.0000 meq | EXTENDED_RELEASE_TABLET | Freq: Once | ORAL | Status: AC
  Administered 2022-07-21: 40 meq via ORAL
  Filled 2022-07-21: qty 2

## 2022-07-21 MED ORDER — K PHOS MONO-SOD PHOS DI & MONO 155-852-130 MG PO TABS
500.0000 mg | ORAL_TABLET | ORAL | Status: AC
  Administered 2022-07-21 (×2): 500 mg via ORAL
  Filled 2022-07-21 (×2): qty 2

## 2022-07-21 MED ORDER — MAGNESIUM OXIDE 400 MG PO TABS: 400.0000 mg | ORAL_TABLET | Freq: Every day | ORAL | 0 refills | Status: DC

## 2022-07-21 MED ORDER — ACETAMINOPHEN 325 MG PO TABS: 650.0000 mg | ORAL_TABLET | Freq: Four times a day (QID) | ORAL | 2 refills | Status: DC | PRN

## 2022-07-21 MED ORDER — ASPIRIN 81 MG PO TBEC: 81.0000 mg | DELAYED_RELEASE_TABLET | Freq: Every day | ORAL | 12 refills | Status: DC

## 2022-07-21 MED ORDER — APIXABAN 2.5 MG PO TABS: 2.5000 mg | ORAL_TABLET | Freq: Two times a day (BID) | ORAL | Status: DC

## 2022-07-21 MED ORDER — MAGNESIUM SULFATE 4 GM/100ML IV SOLN
4.0000 g | Freq: Once | INTRAVENOUS | Status: AC
  Administered 2022-07-21: 4 g via INTRAVENOUS
  Filled 2022-07-21: qty 100

## 2022-07-21 MED ORDER — DOXYCYCLINE HYCLATE 100 MG PO TABS: 100.0000 mg | ORAL_TABLET | Freq: Two times a day (BID) | ORAL | 0 refills | Status: DC

## 2022-07-21 NOTE — Plan of Care (Signed)
  Problem: Education: Goal: Knowledge of General Education information will improve Description: Including pain rating scale, medication(s)/side effects and non-pharmacologic comfort measures Outcome: Progressing   Problem: Health Behavior/Discharge Planning: Goal: Ability to manage health-related needs will improve Outcome: Progressing   Problem: Clinical Measurements: Goal: Ability to maintain clinical measurements within normal limits will improve Outcome: Progressing Goal: Will remain free from infection Outcome: Progressing Goal: Diagnostic test results will improve Outcome: Progressing Goal: Respiratory complications will improve Outcome: Progressing Goal: Cardiovascular complication will be avoided Outcome: Progressing   Problem: Activity: Goal: Risk for activity intolerance will decrease Outcome: Progressing   Problem: Nutrition: Goal: Adequate nutrition will be maintained Outcome: Progressing   Problem: Coping: Goal: Level of anxiety will decrease Outcome: Progressing   Problem: Elimination: Goal: Will not experience complications related to bowel motility Outcome: Progressing Goal: Will not experience complications related to urinary retention Outcome: Progressing   Problem: Pain Managment: Goal: General experience of comfort will improve Outcome: Progressing   Problem: Safety: Goal: Ability to remain free from injury will improve Outcome: Progressing   Problem: Skin Integrity: Goal: Risk for impaired skin integrity will decrease Outcome: Progressing   Problem: Education: Goal: Ability to describe self-care measures that may prevent or decrease complications (Diabetes Survival Skills Education) will improve Outcome: Progressing Goal: Individualized Educational Video(s) Outcome: Progressing   Problem: Coping: Goal: Ability to adjust to condition or change in health will improve Outcome: Progressing   Problem: Fluid Volume: Goal: Ability to  maintain a balanced intake and output will improve Outcome: Progressing   Problem: Health Behavior/Discharge Planning: Goal: Ability to identify and utilize available resources and services will improve Outcome: Progressing Goal: Ability to manage health-related needs will improve Outcome: Progressing   Problem: Metabolic: Goal: Ability to maintain appropriate glucose levels will improve Outcome: Progressing   Problem: Nutritional: Goal: Maintenance of adequate nutrition will improve Outcome: Progressing Goal: Progress toward achieving an optimal weight will improve Outcome: Progressing   Problem: Skin Integrity: Goal: Risk for impaired skin integrity will decrease Outcome: Progressing   Problem: Tissue Perfusion: Goal: Adequacy of tissue perfusion will improve Outcome: Progressing   Problem: Education: Goal: Ability to identify signs and symptoms of gastrointestinal bleeding will improve Outcome: Progressing   Problem: Bowel/Gastric: Goal: Will show no signs and symptoms of gastrointestinal bleeding Outcome: Progressing   Problem: Fluid Volume: Goal: Will show no signs and symptoms of excessive bleeding Outcome: Progressing   Problem: Clinical Measurements: Goal: Complications related to the disease process, condition or treatment will be avoided or minimized Outcome: Progressing   Problem: Education: Goal: Knowledge of the prescribed therapeutic regimen will improve Outcome: Progressing Goal: Ability to verbalize activity precautions or restrictions will improve Outcome: Progressing Goal: Understanding of discharge needs will improve Outcome: Progressing   Problem: Activity: Goal: Ability to perform//tolerate increased activity and mobilize with assistive devices will improve Outcome: Progressing   Problem: Clinical Measurements: Goal: Postoperative complications will be avoided or minimized Outcome: Progressing   Problem: Self-Care: Goal: Ability to meet  self-care needs will improve Outcome: Progressing   Problem: Self-Concept: Goal: Ability to maintain and perform role responsibilities to the fullest extent possible will improve Outcome: Progressing   Problem: Pain Management: Goal: Pain level will decrease with appropriate interventions Outcome: Progressing

## 2022-07-21 NOTE — Progress Notes (Signed)
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    PT Comments    Pt was pleasant and motivated to participate during the session and put forth good effort throughout. Pt continued to require significant physical assistance with bed mobility tasks and once in sitting presented with fair stability but as the session progressed began to lean anteriorly, was able to self-correct with cuing.  Transfer attempts deferred secondary to pt needing to have a BP.  Pt will benefit from PT services in a SNF setting upon discharge to safely address deficits listed in patient problem list for decreased caregiver assistance and eventual return to PLOF.     Recommendations for follow up therapy are one component of a multi-disciplinary discharge planning process, led by the attending physician.  Recommendations may be updated based on patient status, additional functional criteria and insurance authorization.  Follow Up Recommendations  Skilled nursing-short term rehab (<3 hours/day) Can patient physically be transported by private vehicle: No   Assistance Recommended at Discharge Frequent or constant Supervision/Assistance  Patient can return home with the following A lot of help with bathing/dressing/bathroom;Assistance with cooking/housework;Direct supervision/assist for medications management;Assist for  transportation;Help with stairs or ramp for entrance;Direct supervision/assist for financial management;Two people to help with walking and/or transfers   Equipment Recommendations  Other (comment) (TBD at next venue of care)    Recommendations for Other Services       Precautions / Restrictions Precautions Precautions: Fall Restrictions Weight Bearing Restrictions: Yes LLE Weight Bearing: Non weight bearing     Mobility  Bed Mobility Overal bed mobility: Needs Assistance Bed Mobility: Sit to Supine, Rolling, Supine to Sit Rolling: Min assist   Supine to sit: Mod assist Sit to supine: Mod assist, +2 for safety/equipment   General bed mobility comments: Pt required assist for BLE and trunk control with cues for sequencing    Transfers                   General transfer comment: Deferred secondary to pt needing to have a BM near end of session    Ambulation/Gait                   Stairs             Wheelchair Mobility    Modified Rankin (Stroke Patients Only)       Balance Overall balance assessment: Needs assistance   Sitting balance-Leahy Scale: Fair Sitting balance - Comments: EOB x 6 minutes with close SBA secondary to anterior lean as session progressed                                    Cognition Arousal/Alertness: Awake/alert Behavior During Therapy: WFL for tasks assessed/performed, Flat affect Overall Cognitive Status: Within Functional Limits for tasks assessed  Exercises Total Joint Exercises Ankle Circles/Pumps: AROM, Both, 10 reps, Strengthening, 5 reps (manual resistance on the RLE) Quad Sets: Strengthening, Both, 10 reps, 5 reps Gluteal Sets: Strengthening, Both, 10 reps, 5 reps Heel Slides: Strengthening, Both, 10 reps, AAROM Hip ABduction/ADduction: AAROM, Strengthening, 10 reps, Both, 5 reps Straight Leg Raises: AAROM, Strengthening, 10 reps,  Both, 5 reps Long Arc Quad: Strengthening, Both, 15 reps Knee Flexion: Strengthening, Both, 15 reps Other Exercises Other Exercises: Rolling L/R for core therex Other Exercises: Static sitting at EOB for core therex and improved activity tolerance    General Comments        Pertinent Vitals/Pain Pain Assessment Pain Assessment: 0-10 Pain Score: 7  Pain Location: L foot Pain Descriptors / Indicators: Aching, Sore Pain Intervention(s): Repositioned, Monitored during session, Patient requesting pain meds-RN notified, RN gave pain meds during session    Home Living                          Prior Function            PT Goals (current goals can now be found in the care plan section) Progress towards PT goals: Progressing toward goals    Frequency    7X/week      PT Plan Current plan remains appropriate    Co-evaluation              AM-PAC PT "6 Clicks" Mobility   Outcome Measure  Help needed turning from your back to your side while in a flat bed without using bedrails?: A Lot Help needed moving from lying on your back to sitting on the side of a flat bed without using bedrails?: A Lot Help needed moving to and from a bed to a chair (including a wheelchair)?: Total Help needed standing up from a chair using your arms (e.g., wheelchair or bedside chair)?: Total Help needed to walk in hospital room?: Total Help needed climbing 3-5 steps with a railing? : Total 6 Click Score: 8    End of Session   Activity Tolerance: Patient tolerated treatment well Patient left: in bed;with call bell/phone within reach;with bed alarm set;Other (comment) (Pt on bedpan with CNA/nsg aware) Nurse Communication: Mobility status;Weight bearing status (pt on bedpan) PT Visit Diagnosis: Difficulty in walking, not elsewhere classified (R26.2);Muscle weakness (generalized) (M62.81)     Time: [id number] PT Time Calculation (min) (ACUTE ONLY): 26 min  Charges:  $Therapeutic  Exercise: 8-22 mins $Therapeutic Activity: 8-22 mins                    D. Scott Abdoulaye Drum PT, DPT 07/21/22, 12:29 PM

## 2022-07-21 NOTE — Plan of Care (Signed)
Problem: Education: Goal: Knowledge of General Education information will improve Description: Including pain rating scale, medication(s)/side effects and non-pharmacologic comfort measures 07/21/2022 1113 by Alferd Apa, RN Outcome: Adequate for Discharge 07/21/2022 0756 by Alferd Apa, RN Outcome: Progressing   Problem: Health Behavior/Discharge Planning: Goal: Ability to manage health-related needs will improve 07/21/2022 1113 by Alferd Apa, RN Outcome: Adequate for Discharge 07/21/2022 0756 by Alferd Apa, RN Outcome: Progressing   Problem: Clinical Measurements: Goal: Ability to maintain clinical measurements within normal limits will improve 07/21/2022 1113 by Alferd Apa, RN Outcome: Adequate for Discharge 07/21/2022 0756 by Alferd Apa, RN Outcome: Progressing Goal: Will remain free from infection 07/21/2022 1113 by Alferd Apa, RN Outcome: Adequate for Discharge 07/21/2022 0756 by Alferd Apa, RN Outcome: Progressing Goal: Diagnostic test results will improve 07/21/2022 1113 by Alferd Apa, RN Outcome: Adequate for Discharge 07/21/2022 0756 by Alferd Apa, RN Outcome: Progressing Goal: Respiratory complications will improve 07/21/2022 1113 by Alferd Apa, RN Outcome: Adequate for Discharge 07/21/2022 0756 by Alferd Apa, RN Outcome: Progressing Goal: Cardiovascular complication will be avoided 07/21/2022 1113 by Alferd Apa, RN Outcome: Adequate for Discharge 07/21/2022 0756 by Alferd Apa, RN Outcome: Progressing   Problem: Activity: Goal: Risk for activity intolerance will decrease 07/21/2022 1113 by Alferd Apa, RN Outcome: Adequate for Discharge 07/21/2022 0756 by Alferd Apa, RN Outcome: Progressing   Problem: Nutrition: Goal: Adequate nutrition will be maintained 07/21/2022 1113 by Alferd Apa, RN Outcome: Adequate for Discharge 07/21/2022 0756 by Alferd Apa, RN Outcome: Progressing   Problem: Coping: Goal: Level of anxiety  will decrease 07/21/2022 1113 by Alferd Apa, RN Outcome: Adequate for Discharge 07/21/2022 0756 by Alferd Apa, RN Outcome: Progressing   Problem: Elimination: Goal: Will not experience complications related to bowel motility 07/21/2022 1113 by Alferd Apa, RN Outcome: Adequate for Discharge 07/21/2022 0756 by Alferd Apa, RN Outcome: Progressing Goal: Will not experience complications related to urinary retention 07/21/2022 1113 by Alferd Apa, RN Outcome: Adequate for Discharge 07/21/2022 0756 by Alferd Apa, RN Outcome: Progressing   Problem: Pain Managment: Goal: General experience of comfort will improve 07/21/2022 1113 by Alferd Apa, RN Outcome: Adequate for Discharge 07/21/2022 0756 by Alferd Apa, RN Outcome: Progressing   Problem: Safety: Goal: Ability to remain free from injury will improve 07/21/2022 1113 by Alferd Apa, RN Outcome: Adequate for Discharge 07/21/2022 0756 by Alferd Apa, RN Outcome: Progressing   Problem: Skin Integrity: Goal: Risk for impaired skin integrity will decrease 07/21/2022 1113 by Alferd Apa, RN Outcome: Adequate for Discharge 07/21/2022 0756 by Alferd Apa, RN Outcome: Progressing   Problem: Education: Goal: Ability to describe self-care measures that may prevent or decrease complications (Diabetes Survival Skills Education) will improve 07/21/2022 1113 by Alferd Apa, RN Outcome: Adequate for Discharge 07/21/2022 0756 by Alferd Apa, RN Outcome: Progressing Goal: Individualized Educational Video(s) 07/21/2022 1113 by Alferd Apa, RN Outcome: Adequate for Discharge 07/21/2022 0756 by Alferd Apa, RN Outcome: Progressing   Problem: Coping: Goal: Ability to adjust to condition or change in health will improve 07/21/2022 1113 by Alferd Apa, RN Outcome: Adequate for Discharge 07/21/2022 0756 by Alferd Apa, RN Outcome: Progressing   Problem: Fluid Volume: Goal: Ability to maintain a balanced intake and  output will improve 07/21/2022 1113 by Alferd Apa, RN Outcome: Adequate for Discharge 07/21/2022 0756 by Vertis Kelch,  Alfredo Batty, RN Outcome: Progressing   Problem: Health Behavior/Discharge Planning: Goal: Ability to identify and utilize available resources and services will improve 07/21/2022 1113 by Alferd Apa, RN Outcome: Adequate for Discharge 07/21/2022 0756 by Alferd Apa, RN Outcome: Progressing Goal: Ability to manage health-related needs will improve 07/21/2022 1113 by Alferd Apa, RN Outcome: Adequate for Discharge 07/21/2022 0756 by Alferd Apa, RN Outcome: Progressing   Problem: Metabolic: Goal: Ability to maintain appropriate glucose levels will improve 07/21/2022 1113 by Alferd Apa, RN Outcome: Adequate for Discharge 07/21/2022 0756 by Alferd Apa, RN Outcome: Progressing   Problem: Nutritional: Goal: Maintenance of adequate nutrition will improve 07/21/2022 1113 by Alferd Apa, RN Outcome: Adequate for Discharge 07/21/2022 0756 by Alferd Apa, RN Outcome: Progressing Goal: Progress toward achieving an optimal weight will improve 07/21/2022 1113 by Alferd Apa, RN Outcome: Adequate for Discharge 07/21/2022 0756 by Alferd Apa, RN Outcome: Progressing   Problem: Skin Integrity: Goal: Risk for impaired skin integrity will decrease 07/21/2022 1113 by Alferd Apa, RN Outcome: Adequate for Discharge 07/21/2022 0756 by Alferd Apa, RN Outcome: Progressing   Problem: Tissue Perfusion: Goal: Adequacy of tissue perfusion will improve 07/21/2022 1113 by Alferd Apa, RN Outcome: Adequate for Discharge 07/21/2022 0756 by Alferd Apa, RN Outcome: Progressing   Problem: Education: Goal: Ability to identify signs and symptoms of gastrointestinal bleeding will improve 07/21/2022 1113 by Alferd Apa, RN Outcome: Adequate for Discharge 07/21/2022 0756 by Alferd Apa, RN Outcome: Progressing   Problem: Bowel/Gastric: Goal: Will show no signs and  symptoms of gastrointestinal bleeding 07/21/2022 1113 by Alferd Apa, RN Outcome: Adequate for Discharge 07/21/2022 0756 by Alferd Apa, RN Outcome: Progressing   Problem: Fluid Volume: Goal: Will show no signs and symptoms of excessive bleeding 07/21/2022 1113 by Alferd Apa, RN Outcome: Adequate for Discharge 07/21/2022 0756 by Alferd Apa, RN Outcome: Progressing   Problem: Clinical Measurements: Goal: Complications related to the disease process, condition or treatment will be avoided or minimized 07/21/2022 1113 by Alferd Apa, RN Outcome: Adequate for Discharge 07/21/2022 0756 by Alferd Apa, RN Outcome: Progressing   Problem: Education: Goal: Knowledge of the prescribed therapeutic regimen will improve 07/21/2022 1113 by Alferd Apa, RN Outcome: Adequate for Discharge 07/21/2022 0756 by Alferd Apa, RN Outcome: Progressing Goal: Ability to verbalize activity precautions or restrictions will improve 07/21/2022 1113 by Alferd Apa, RN Outcome: Adequate for Discharge 07/21/2022 0756 by Alferd Apa, RN Outcome: Progressing Goal: Understanding of discharge needs will improve 07/21/2022 1113 by Alferd Apa, RN Outcome: Adequate for Discharge 07/21/2022 0756 by Alferd Apa, RN Outcome: Progressing   Problem: Activity: Goal: Ability to perform//tolerate increased activity and mobilize with assistive devices will improve 07/21/2022 1113 by Alferd Apa, RN Outcome: Adequate for Discharge 07/21/2022 0756 by Alferd Apa, RN Outcome: Progressing   Problem: Clinical Measurements: Goal: Postoperative complications will be avoided or minimized 07/21/2022 1113 by Alferd Apa, RN Outcome: Adequate for Discharge 07/21/2022 0756 by Alferd Apa, RN Outcome: Progressing   Problem: Self-Care: Goal: Ability to meet self-care needs will improve 07/21/2022 1113 by Alferd Apa, RN Outcome: Adequate for Discharge 07/21/2022 0756 by Alferd Apa, RN Outcome:  Progressing   Problem: Self-Concept: Goal: Ability to maintain and perform role responsibilities to the fullest extent possible will improve 07/21/2022 1113 by Alferd Apa, RN Outcome: Adequate for Discharge 07/21/2022 [phone]  by Alferd Apa, RN Outcome: Progressing   Problem: Pain Management: Goal: Pain level will decrease with appropriate interventions 07/21/2022 1113 by Alferd Apa, RN Outcome: Adequate for Discharge 07/21/2022 0756 by Alferd Apa, RN Outcome: Progressing

## 2022-07-21 NOTE — Discharge Summary (Signed)
[facility] Discharge Summary   Patient: Charles Jennings [id number]  PCP: Albina Billet, MD  Date of admission: 07/12/2022   Date of discharge:  07/21/2022     Discharge Diagnoses:  Principal Problem:   Osteomyelitis ([place]) Active Problems:   Limb ischemia   Insulin dependent type 2 diabetes mellitus ([place])   Mixed hyperlipidemia   Benign essential HTN   Stage 3a chronic kidney disease (CKD) ([place])   Weakness   Gangrene of toe ([place])   Paranoia ([place])   PAD (peripheral artery disease) ([place])   Admitted From: Home Disposition:  SNF   Recommendations for Outpatient Follow-up:  Follow with PCP, patient should be seen by an MD in 1 to 2 days, continue to monitor vital signs, repeat CBC, BMP and magnesium level after 1-2 weeks Follow-up with podiatry in 1 to 2 weeks, continue wound care Follow-up with vascular surgery in 1 to 2 weeks Follow up LABS/TEST:  as above   Contact information for follow-up providers     Algernon Huxley, MD Follow up in 1 month(s).   Specialties: Vascular Surgery, Radiology, Interventional Cardiology Why: Duplex ultrasound with ABI's Left Lower extremity. Contact information: [place] [address] [phone]              Contact information for after-discharge care     Destination     [facility] .   Service: Skilled Nursing Contact information: [address] [place] [phone]                    Diet recommendation: Carb modified diet  Activity: The patient is advised to gradually reintroduce usual activities, as tolerated  Discharge Condition: stable  Code Status: Full code   History of present illness: As per the H and P dictated on admission Hospital Course:  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 ED w/up: VS stable, Na 131, K 4.5, cl 90, bicarb 22, BUN 53, S.Cr 1.36, nonfasting blood glucose 242, EGFR 49, WBC 16.8, hemoglobin 17.1, platelets of 211. Anion gap was elevated at 19.  Alk phos elevated at 212. ED treatment: Cefepime, vancomycin, metronidazole. He reports he saw Dr. Cleda Mccreedy, and was advised to come to the ED. He reports the left foot has been bothering him for over one year. He states this was a regular scheduled visit.    Assessment and Plan # LLE peripheral artery disease Vascular surgery consulted, On 2/8 s/p angiogram, mechanical thrombectomy of left SFA pop with popliteal artery and tibial peroneal trunk. S/p tPA at the time of procedure, patient was started on continuous tPA infusion overnight, right femoral triple-lumen catheter was placed. On 2/9  Left lower extremity angiogram, Mechanical thrombectomy of the left SFA, popliteal artery, tibioperoneal trunk, and proximal posterior tibial artery with the penumbra CAT 6 device. Stent placement to the left distal SFA and above-knee popliteal artery with 6 mm diameter by 15 cm length Viabahn stent for residual thrombus and stenosis after thrombectomy. StarClose closure device right femoral artery S/p Heparin IV infusion, transition to Eliquis 2.5 mg p.o. twice daily and aspirin 81 mg p.o. daily as per vascular surgery. Follow vascular surgery in 1 to 2 weeks as an outpatient for further management.   # Osteomyelitis of  Left third toe, with cellulitis at the site of left 3rd toe amputation Podiatry consulted, on 2/11 s/p amputation left second toeMTP J and Excisional debridement necrotic tissue and abscess to and including tendon and muscle dorsal left foot Ankle block for postoperative anesthetic left ankle. S/p cefepime and Flagyl, discontinued on 2/12 due to possible toxicity as patient was having paranoid thoughts and  delirium. S/p vancomycin, transition to Zyvox 6 mg p.o. twice daily and continued Unasyn. Blood cultures x 2 NGTD. S/p IVF for hydration. On 2/11 wound culture from the OR growing MRSA. ID recommendation appreciated. On 2/15 podiatrist changed wound VAC, pictures were saved in the media section, recommended no need of IV antibiotics.  ID recommended doxycycline and Levaquin for 2 weeks.  Continue wound care management and wound VAC as per improvement and follow-up with vascular surgery as an outpatient. # Insulin dependent type 2 diabetes mellitus: During hospital stay patient was on NovoLog sliding scale, resumed Home NovoLog 70/30, 10 units daily before breakfast on discharge and also resumed Jardiance 10 mg daily, metformin 500 mg p.o. twice daily.  Monitor fingerstick blood glucose and titrate dose of insulin accordingly.  Continue diabetic diet. # Mixed hyperlipidemia, Atorvastatin 40 mg daily # Essential HTN, Resumed Diltiazem 240 mg daily, lisinopril 40 mg daily.  # Stage 3a chronic kidney disease (CKD), Serum creatinine elevation, does not meet criteria for acute kidney injury, presumed secondary to poor p.o. intake. Serum creatinine ranges 1.03-1.29-->0.88, improved S/p IVF.  Continue oral hydration. # Isotonic hyponatremia most likely due to nutritional deficiency. Serum osmolality 289, Sodium 132--138--133 fluctuating, continue adequate sodium in the diet. # Hypokalemia, potassium repleted. # Hypomagnesemia, mag repleted.  Started oral supplement # Hypophosphatemia, Phos repleted.  Show to increase oral intake. Repeat electrolytes in 1 to 2 weeks # Weakness, pt was on the floor foe 1 day due to extreme weakness as per his daughter. CK 66 wnl, B12 level 563 wnl, TSH 3.17 wnl. PT and OT eval done, recommend SNF placement  Body mass index is 22.28 kg/m.  Nutrition Interventions:   Patient was seen by physical therapy, who recommended SNF placement, which was arranged. On the day of the  discharge the patient's vitals were stable, and no other acute medical condition were reported by patient. the patient was felt safe to be discharge at [facility].  Consultants: Vascular surgery, podiatry, ID, psychiatrist Procedures:  2/8 s/p angiogram, mechanical thrombectomy of left SFA pop with popliteal artery and tibial peroneal trunk. S/p tPA at the time of procedure, patient was started on continuous tPA infusion overnight, right femoral triple-lumen catheter was placed.  2/11 s/p amputation left second toeMTP J and Excisional debridement necrotic tissue and abscess to and including tendon and muscle dorsal left foot Ankle block for postoperative anesthetic left ankle   Discharge Exam: General: Appear in no distress, no Rash; Oral Mucosa Clear, moist. Cardiovascular: S1 and S2 Present, no Murmur, Respiratory: normal respiratory effort, Bilateral Air entry present and no Crackles, no wheezes Abdomen: Bowel Sound present, Soft and no tenderness, no hernia Extremities: no Pedal edema, no calf tenderness, s/l left foot 2nd toe amputation and excisional debridement, dressing CDI, wound VAC attached. Neurology: alert and oriented to time, place, and person affect appropriate.  Filed Weights   07/12/22 1104 07/13/22 1930 07/15/22 0000  Weight: 62.5 kg 55 kg 65.5 kg   Vitals:   07/21/22 0037 07/21/22 0748  BP: (!) 118/57 (!) 140/64  Pulse: 72 80  Resp: 18 16  Temp: 97.6 F (  36.4 C) 97.7 F (36.5 C)  SpO2: 95% 90%    DISCHARGE MEDICATION: Allergies as of 07/21/2022   No Known Allergies      Medication List     STOP taking these medications    clopidogrel 75 MG tablet Commonly known as: PLAVIX       TAKE these medications    acetaminophen 325 MG tablet Commonly known as: Tylenol Take 2 tablets (650 mg total) by mouth every 6 (six) hours as needed.   apixaban 2.5 MG Tabs tablet Commonly known as: ELIQUIS Take 1 tablet (2.5 mg total) by mouth 2 (two) times daily.    aspirin EC 81 MG tablet Take 1 tablet (81 mg total) by mouth daily. Swallow whole. Start taking on: July 22, 2022   atorvastatin 40 MG tablet Commonly known as: LIPITOR Take 1 tablet (40 mg total) by mouth every evening.   diltiazem 240 MG 24 hr capsule Commonly known as: CARDIZEM CD Take 240 mg by mouth daily.   doxycycline 100 MG tablet Commonly known as: VIBRA-TABS Take 1 tablet (100 mg total) by mouth every 12 (twelve) hours for 14 days.   gabapentin 300 MG capsule Commonly known as: NEURONTIN Take 300 mg by mouth daily.   Jardiance 10 MG Tabs tablet Generic drug: empagliflozin Take 10 mg by mouth daily.   levofloxacin 500 MG tablet Commonly known as: LEVAQUIN Take 1 tablet (500 mg total) by mouth at bedtime for 14 days.   lisinopril 40 MG tablet Commonly known as: ZESTRIL Take 40 mg by mouth daily.   loratadine 10 MG tablet Commonly known as: CLARITIN Take 1 tablet by mouth daily.   magnesium oxide 400 MG tablet Commonly known as: MAG-OX Take 1 tablet (400 mg total) by mouth daily for 14 days.   metFORMIN 500 MG tablet Commonly known as: GLUCOPHAGE Take 500 mg by mouth 2 (two) times daily with a meal.   NovoLIN 70/30 (70-30) 100 UNIT/ML injection Generic drug: insulin NPH-regular Human Inject 10 Units into the skin daily with breakfast. What changed:  how much to take additional instructions               Discharge Care Instructions  (From admission, onward)           Start     Ordered   07/21/22 0000  Discharge wound care:       Comments: As mentioned above   07/21/22 1031           No Known Allergies Discharge Instructions     Call MD for:  difficulty breathing, headache or visual disturbances   Complete by: As directed    Call MD for:  extreme fatigue   Complete by: As directed    Call MD for:  persistant dizziness or light-headedness   Complete by: As directed    Call MD for:  persistant nausea and vomiting   Complete  by: As directed    Call MD for:  severe uncontrolled pain   Complete by: As directed    Call MD for:  temperature >100.4   Complete by: As directed    Diet - low sodium heart healthy   Complete by: As directed    Discharge instructions   Complete by: As directed    Follow with PCP, patient should be seen by an MD in 1 to 2 days, continue to monitor vital signs, repeat CBC, BMP and magnesium level after 1-2 weeks Follow-up with podiatry in 1 to 2 weeks,  continue wound care Follow-up with vascular surgery in 1 to 2 weeks   Discharge wound care:   Complete by: As directed    As mentioned above   Increase activity slowly   Complete by: As directed        The results of significant diagnostics from this hospitalization (including imaging, microbiology, ancillary and laboratory) are listed below for reference.    Significant Diagnostic Studies: PERIPHERAL VASCULAR CATHETERIZATION  Result Date: 07/14/2022 See surgical note for result.  PERIPHERAL VASCULAR CATHETERIZATION  Result Date: 07/13/2022 See surgical note for result.  DG Foot Complete Left  Result Date: 07/13/2022 CLINICAL DATA:  Gangrene of second toe EXAM: LEFT FOOT - COMPLETE 3+ VIEW COMPARISON:  06/11/2022 FINDINGS: Bones are demineralized. Interval third toe amputation at the MTP joint level. No erosion or periosteal elevation is seen. No fracture or dislocation. Soft tissue swelling at the amputation site with a small amount of soft tissue air. IMPRESSION: Interval third toe amputation at the MTP joint level. Soft tissue swelling at the amputation site with a small amount of soft tissue air. No radiographic evidence of osteomyelitis. Electronically Signed   By: Davina Poke D.O.   On: 07/13/2022 15:18    Microbiology: Recent Results (from the past 240 hour(s))  Blood Culture (routine x 2)     Status: None   Collection Time: 07/12/22 12:01 PM   Specimen: BLOOD  Result Value Ref Range Status   Specimen Description  BLOOD BLOOD RIGHT WRIST  Final   Special Requests   Final    BOTTLES DRAWN AEROBIC AND ANAEROBIC BLOOD RIGHT WRIST   Culture   Final    NO GROWTH 5 DAYS Performed at [facility], [address] [place], [place] [address]    Report Status 07/17/2022 FINAL  Final  Blood Culture (routine x 2)     Status: None   Collection Time: 07/12/22 12:01 PM   Specimen: BLOOD  Result Value Ref Range Status   Specimen Description BLOOD BLOOD LEFT WRIST  Final   Special Requests   Final    BOTTLES DRAWN AEROBIC AND ANAEROBIC Blood Culture results may not be optimal due to an inadequate volume of blood received in culture bottles   Culture   Final    NO GROWTH 5 DAYS Performed at [facility], [address] [place], [place] [address]    Report Status 07/17/2022 FINAL  Final  MRSA Next Gen by PCR, Nasal     Status: None   Collection Time: 07/13/22  7:36 PM   Specimen: Nasal Mucosa; Nasal Swab  Result Value Ref Range Status   MRSA by PCR Next Gen NOT DETECTED NOT DETECTED Final    Comment: (NOTE) The GeneXpert MRSA Assay (FDA approved for NASAL specimens only), is one component of a comprehensive MRSA colonization surveillance program. It is not intended to diagnose MRSA infection nor to guide or monitor treatment for MRSA infections. Test performance is not FDA approved in patients less than 4 years old. Performed at [facility], [address] [place], [place] [address]   Culture, blood (Routine X 2) w Reflex to ID Panel     Status: None   Collection Time: 07/16/22  1:54 AM   Specimen: BLOOD LEFT ARM  Result Value Ref Range Status   Specimen Description BLOOD LEFT ARM  Final   Special Requests   Final    BOTTLES DRAWN AEROBIC AND ANAEROBIC Blood Culture adequate volume   Culture   Final  NO GROWTH 5 DAYS Performed at [facility], [address] [place], [place] [address]    Report Status 07/21/2022 FINAL  Final  Culture, blood (Routine X  2) w Reflex to ID Panel     Status: None   Collection Time: 07/16/22  2:00 AM   Specimen: BLOOD LEFT HAND  Result Value Ref Range Status   Specimen Description BLOOD LEFT HAND  Final   Special Requests   Final    BOTTLES DRAWN AEROBIC AND ANAEROBIC Blood Culture adequate volume   Culture   Final    NO GROWTH 5 DAYS Performed at [facility], [address], [place], [place] [address]    Report Status 07/21/2022 FINAL  Final  Aerobic/Anaerobic Culture w Gram Stain (surgical/deep wound)     Status: None (Preliminary result)   Collection Time: 07/16/22 11:51 AM   Specimen: PATH Digit amputation; Tissue  Result Value Ref Range Status   Specimen Description   Final    WOUND Performed at [facility], [address] [place], [place] [address]    Special Requests LEFT 3RD TOE ABSC  Final   Gram Stain   Final    RARE WBC PRESENT, PREDOMINANTLY PMN GRAM POSITIVE COCCI Performed at [facility], [place] [address] [place],  [address]    Culture   Final    FEW METHICILLIN RESISTANT STAPHYLOCOCCUS AUREUS RARE PROTEUS VULGARIS NO ANAEROBES ISOLATED; CULTURE IN PROGRESS FOR 5 DAYS    Report Status PENDING  Incomplete   Organism ID, Bacteria METHICILLIN RESISTANT STAPHYLOCOCCUS AUREUS  Final   Organism ID, Bacteria PROTEUS VULGARIS  Final      Susceptibility   Methicillin resistant staphylococcus aureus - MIC*    CIPROFLOXACIN >=8 RESISTANT Resistant     ERYTHROMYCIN >=8 RESISTANT Resistant     GENTAMICIN <=0.5 SENSITIVE Sensitive     OXACILLIN >=4 RESISTANT Resistant     TETRACYCLINE <=1 SENSITIVE Sensitive     VANCOMYCIN 1 SENSITIVE Sensitive     TRIMETH/SULFA <=10 SENSITIVE Sensitive     CLINDAMYCIN <=0.25 SENSITIVE Sensitive     RIFAMPIN <=0.5 SENSITIVE Sensitive     Inducible Clindamycin NEGATIVE Sensitive     * FEW METHICILLIN RESISTANT STAPHYLOCOCCUS AUREUS   Proteus vulgaris - MIC*    AMPICILLIN >=32 RESISTANT Resistant     CEFEPIME <=0.12  SENSITIVE Sensitive     CEFTAZIDIME <=1 SENSITIVE Sensitive     CIPROFLOXACIN <=0.25 SENSITIVE Sensitive     GENTAMICIN <=1 SENSITIVE Sensitive     IMIPENEM 8 INTERMEDIATE Intermediate     TRIMETH/SULFA <=20 SENSITIVE Sensitive     AMPICILLIN/SULBACTAM 16 INTERMEDIATE Intermediate     PIP/TAZO <=4 SENSITIVE Sensitive     * RARE PROTEUS VULGARIS     Labs: CBC: Recent Labs  Lab 07/15/22 2136 07/17/22 1000 07/18/22 0534 07/19/22 0346 07/20/22 0540 07/21/22 0436  WBC 14.1* 11.6* 7.3 8.9 6.7 6.0  NEUTROABS 10.5*  --   --   --   --   --   HGB 12.2* 14.4 13.6 13.1 11.9* 12.6*  HCT 36.4* 42.9 41.3 40.0 36.3* 37.8*  MCV 87.7 86.3 87.3 87.0 87.7 87.3  PLT 148* 217 204 228 203 [id number]   Basic Metabolic Panel: Recent Labs  Lab 07/15/22 2136 07/16/22 0511 07/17/22 1000 07/18/22 0534 07/19/22 0346 07/20/22 0540 07/21/22 0436  NA  --  138 138 137 137 138 133*  K  --  4.3 3.7 3.6 3.5 3.3* 3.3*  CL  --  101 106 103 104 106 100  CO2  --  31 23 24 27 26 26  $ GLUCOSE  --  222* 186* 155* 184* 172* 145*  BUN  --  28* 19 15 13 10 8  $ CREATININE  --  0.88 0.86 0.79 0.80 0.67 0.74  CALCIUM  --  7.5* 7.3* 7.3* 7.3* 6.9* 7.0*  MG 1.9  --  1.6* 1.8 1.8  --  1.4*  PHOS 1.5* 3.0 1.9* 2.2* 2.6  --  2.3*   Liver Function Tests: Recent Labs  Lab 07/15/22 2135  AST 25  ALT 16  ALKPHOS 137*  BILITOT 0.5  PROT 4.0*  ALBUMIN 1.7*   No results for input(s): "LIPASE", "AMYLASE" in the last 168 hours. No results for input(s): "AMMONIA" in the last 168 hours. Cardiac Enzymes: No results for input(s): "CKTOTAL", "CKMB", "CKMBINDEX", "TROPONINI" in the last 168 hours. BNP (last 3 results) No results for input(s): "BNP" in the last 8760 hours. CBG: Recent Labs  Lab 07/20/22 0811 07/20/22 1155 07/20/22 1604 07/20/22 2140 07/21/22 0750  GLUCAP 159* 185* 188* 120* 133*    Time spent: 35 minutes  Signed:  Val Riles  [facility]  07/21/2022 10:57 AM

## 2022-07-21 NOTE — TOC Progression Note (Signed)
Transition of Care [facility]) - Progression Note    Patient Details  Name: Charles Jennings MRN: [id number] Date of Birth: 11-Mar-1928  Transition of Care [facility]) CM/SW [place], RN Phone Number: 07/21/2022, 12:45 PM  Clinical Narrative:     Called ems to transport the patient to [facility] E10 Daughter Gabriel Cirri is aware  Expected Discharge Plan: [place] Barriers to Discharge: Continued Medical Work up  Expected Discharge Plan and Services   Discharge Planning Services: CM Consult Post Acute Care Choice: [place] Living arrangements for the past 2 months: Apartment Expected Discharge Date: 07/21/22               DME Arranged: N/A DME Agency: NA       HH Arranged: NA [place] Agency: NA         Social Determinants of Health (SDOH) Interventions SDOH Screenings   Food Insecurity: No Food Insecurity (07/12/2022)  Housing: Low Risk  (07/12/2022)  Transportation Needs: No Transportation Needs (07/12/2022)  Utilities: Not At Risk (07/12/2022)  Tobacco Use: Low Risk  (07/17/2022)  Recent Concern: Tobacco Use - Medium Risk (07/12/2022)    Readmission Risk Interventions     No data to display

## 2022-07-21 NOTE — Care Management Important Message (Signed)
Important Message  Patient Details  Name: Charles Jennings MRN: [id number] Date of Birth: 02/12/1928   Medicare Important Message Given:  Yes     Juliann Pulse A Ankit Degregorio 07/21/2022, 10:37 AM

## 2022-07-25 ENCOUNTER — Emergency Department: Payer: Medicare Other

## 2022-07-25 ENCOUNTER — Encounter: Payer: Self-pay | Admitting: Internal Medicine

## 2022-07-25 ENCOUNTER — Other Ambulatory Visit: Payer: Self-pay

## 2022-07-25 ENCOUNTER — Inpatient Hospital Stay: Payer: Medicare Other

## 2022-07-25 ENCOUNTER — Inpatient Hospital Stay
Admission: EM | Admit: 2022-07-25 | Discharge: 2022-07-27 | DRG: 637 | Disposition: A | Discharge status: Skilled Nursing Facility | Payer: Medicare Other | Source: Skilled Nursing Facility | Attending: Osteopathic Medicine | Admitting: Osteopathic Medicine

## 2022-07-25 DIAGNOSIS — Z950 Presence of cardiac pacemaker: Secondary | ICD-10-CM | POA: Diagnosis not present

## 2022-07-25 DIAGNOSIS — R400 Somnolence: Secondary | ICD-10-CM | POA: Diagnosis not present

## 2022-07-25 DIAGNOSIS — I6203 Nontraumatic chronic subdural hemorrhage: Secondary | ICD-10-CM | POA: Diagnosis present

## 2022-07-25 DIAGNOSIS — M1990 Unspecified osteoarthritis, unspecified site: Secondary | ICD-10-CM | POA: Diagnosis present

## 2022-07-25 DIAGNOSIS — X58XXXA Exposure to other specified factors, initial encounter: Secondary | ICD-10-CM | POA: Diagnosis present

## 2022-07-25 DIAGNOSIS — Z7984 Long term (current) use of oral hypoglycemic drugs: Secondary | ICD-10-CM

## 2022-07-25 DIAGNOSIS — L89626 Pressure-induced deep tissue damage of left heel: Secondary | ICD-10-CM | POA: Diagnosis present

## 2022-07-25 DIAGNOSIS — L89152 Pressure ulcer of sacral region, stage 2: Secondary | ICD-10-CM | POA: Diagnosis present

## 2022-07-25 DIAGNOSIS — I10 Essential (primary) hypertension: Secondary | ICD-10-CM | POA: Diagnosis not present

## 2022-07-25 DIAGNOSIS — I6200 Nontraumatic subdural hemorrhage, unspecified: Secondary | ICD-10-CM | POA: Diagnosis present

## 2022-07-25 DIAGNOSIS — I110 Hypertensive heart disease with heart failure: Secondary | ICD-10-CM | POA: Diagnosis present

## 2022-07-25 DIAGNOSIS — Z7901 Long term (current) use of anticoagulants: Secondary | ICD-10-CM

## 2022-07-25 DIAGNOSIS — I442 Atrioventricular block, complete: Secondary | ICD-10-CM | POA: Diagnosis present

## 2022-07-25 DIAGNOSIS — R4182 Altered mental status, unspecified: Secondary | ICD-10-CM | POA: Diagnosis present

## 2022-07-25 DIAGNOSIS — E119 Type 2 diabetes mellitus without complications: Secondary | ICD-10-CM | POA: Diagnosis not present

## 2022-07-25 DIAGNOSIS — G9340 Encephalopathy, unspecified: Principal | ICD-10-CM

## 2022-07-25 DIAGNOSIS — N179 Acute kidney failure, unspecified: Secondary | ICD-10-CM | POA: Diagnosis present

## 2022-07-25 DIAGNOSIS — Z8249 Family history of ischemic heart disease and other diseases of the circulatory system: Secondary | ICD-10-CM

## 2022-07-25 DIAGNOSIS — E782 Mixed hyperlipidemia: Secondary | ICD-10-CM | POA: Diagnosis present

## 2022-07-25 DIAGNOSIS — S2232XA Fracture of one rib, left side, initial encounter for closed fracture: Secondary | ICD-10-CM | POA: Diagnosis present

## 2022-07-25 DIAGNOSIS — Z66 Do not resuscitate: Secondary | ICD-10-CM | POA: Diagnosis present

## 2022-07-25 DIAGNOSIS — E11621 Type 2 diabetes mellitus with foot ulcer: Secondary | ICD-10-CM | POA: Diagnosis present

## 2022-07-25 DIAGNOSIS — M86172 Other acute osteomyelitis, left ankle and foot: Secondary | ICD-10-CM | POA: Diagnosis present

## 2022-07-25 DIAGNOSIS — I493 Ventricular premature depolarization: Secondary | ICD-10-CM | POA: Diagnosis present

## 2022-07-25 DIAGNOSIS — F0390 Unspecified dementia without behavioral disturbance: Secondary | ICD-10-CM | POA: Diagnosis present

## 2022-07-25 DIAGNOSIS — I2510 Atherosclerotic heart disease of native coronary artery without angina pectoris: Secondary | ICD-10-CM | POA: Diagnosis present

## 2022-07-25 DIAGNOSIS — L03116 Cellulitis of left lower limb: Secondary | ICD-10-CM

## 2022-07-25 DIAGNOSIS — E1169 Type 2 diabetes mellitus with other specified complication: Principal | ICD-10-CM | POA: Diagnosis present

## 2022-07-25 DIAGNOSIS — I739 Peripheral vascular disease, unspecified: Secondary | ICD-10-CM | POA: Diagnosis present

## 2022-07-25 DIAGNOSIS — L8990 Pressure ulcer of unspecified site, unspecified stage: Secondary | ICD-10-CM | POA: Insufficient documentation

## 2022-07-25 DIAGNOSIS — M869 Osteomyelitis, unspecified: Secondary | ICD-10-CM | POA: Diagnosis present

## 2022-07-25 DIAGNOSIS — Z8614 Personal history of Methicillin resistant Staphylococcus aureus infection: Secondary | ICD-10-CM

## 2022-07-25 DIAGNOSIS — Z79899 Other long term (current) drug therapy: Secondary | ICD-10-CM

## 2022-07-25 DIAGNOSIS — Z89422 Acquired absence of other left toe(s): Secondary | ICD-10-CM | POA: Diagnosis not present

## 2022-07-25 DIAGNOSIS — T68XXXA Hypothermia, initial encounter: Secondary | ICD-10-CM | POA: Insufficient documentation

## 2022-07-25 DIAGNOSIS — J9601 Acute respiratory failure with hypoxia: Secondary | ICD-10-CM | POA: Diagnosis present

## 2022-07-25 DIAGNOSIS — R410 Disorientation, unspecified: Secondary | ICD-10-CM | POA: Diagnosis not present

## 2022-07-25 DIAGNOSIS — E1152 Type 2 diabetes mellitus with diabetic peripheral angiopathy with gangrene: Secondary | ICD-10-CM | POA: Diagnosis present

## 2022-07-25 DIAGNOSIS — Z85828 Personal history of other malignant neoplasm of skin: Secondary | ICD-10-CM | POA: Diagnosis not present

## 2022-07-25 DIAGNOSIS — L97529 Non-pressure chronic ulcer of other part of left foot with unspecified severity: Secondary | ICD-10-CM | POA: Diagnosis present

## 2022-07-25 DIAGNOSIS — I5032 Chronic diastolic (congestive) heart failure: Secondary | ICD-10-CM | POA: Diagnosis present

## 2022-07-25 DIAGNOSIS — Z7982 Long term (current) use of aspirin: Secondary | ICD-10-CM

## 2022-07-25 DIAGNOSIS — Z794 Long term (current) use of insulin: Secondary | ICD-10-CM

## 2022-07-25 DIAGNOSIS — Z7902 Long term (current) use of antithrombotics/antiplatelets: Secondary | ICD-10-CM

## 2022-07-25 LAB — CBC WITH DIFFERENTIAL/PLATELET
Abs Immature Granulocytes: 0.03 10*3/uL (ref 0.00–0.07)
Basophils Absolute: 0 10*3/uL (ref 0.0–0.1)
Basophils Relative: 0 %
Eosinophils Absolute: 0 10*3/uL (ref 0.0–0.5)
Eosinophils Relative: 0 %
HCT: 41 % (ref 39.0–52.0)
Hemoglobin: 13.3 g/dL (ref 13.0–17.0)
Immature Granulocytes: 0 %
Lymphocytes Relative: 18 %
Lymphs Abs: 1.8 10*3/uL (ref 0.7–4.0)
MCH: 28.9 pg (ref 26.0–34.0)
MCHC: 32.4 g/dL (ref 30.0–36.0)
MCV: 88.9 fL (ref 80.0–100.0)
Monocytes Absolute: 0.9 10*3/uL (ref 0.1–1.0)
Monocytes Relative: 9 %
Neutro Abs: 7.1 10*3/uL (ref 1.7–7.7)
Neutrophils Relative %: 73 %
Platelets: 319 10*3/uL (ref 150–400)
RBC: 4.61 MIL/uL (ref 4.22–5.81)
RDW: 14.1 % (ref 11.5–15.5)
WBC: 10 10*3/uL (ref 4.0–10.5)
nRBC: 0 % (ref 0.0–0.2)

## 2022-07-25 LAB — URINALYSIS, ROUTINE W REFLEX MICROSCOPIC
Bacteria, UA: NONE SEEN
Bilirubin Urine: NEGATIVE
Glucose, UA: >=500 mg/dL — AB
Ketones, ur: 20 mg/dL — AB
Leukocytes,Ua: NEGATIVE
Nitrite: NEGATIVE
Protein, ur: 30 mg/dL — AB
Specific Gravity, Urine: 1.017 (ref 1.005–1.030)
pH: 5 (ref 5.0–8.0)

## 2022-07-25 LAB — LACTIC ACID, PLASMA
Lactic Acid, Venous: 1.5 mmol/L (ref 0.5–1.9)
Lactic Acid, Venous: 1.8 mmol/L (ref 0.5–1.9)

## 2022-07-25 LAB — COMPREHENSIVE METABOLIC PANEL
ALT: 27 U/L (ref 0–44)
AST: 42 U/L — ABNORMAL HIGH (ref 15–41)
Albumin: 2.4 g/dL — ABNORMAL LOW (ref 3.5–5.0)
Alkaline Phosphatase: 173 U/L — ABNORMAL HIGH (ref 38–126)
Anion gap: 12 (ref 5–15)
BUN: 20 mg/dL (ref 8–23)
CO2: 23 mmol/L (ref 22–32)
Calcium: 8.3 mg/dL — ABNORMAL LOW (ref 8.9–10.3)
Chloride: 100 mmol/L (ref 98–111)
Creatinine, Ser: 1.29 mg/dL — ABNORMAL HIGH (ref 0.61–1.24)
GFR, Estimated: 52 mL/min — ABNORMAL LOW (ref >60)
Glucose, Bld: 91 mg/dL (ref 70–99)
Potassium: 3.8 mmol/L (ref 3.5–5.1)
Sodium: 135 mmol/L (ref 135–145)
Total Bilirubin: 1.3 mg/dL — ABNORMAL HIGH (ref 0.3–1.2)
Total Protein: 5.3 g/dL — ABNORMAL LOW (ref 6.5–8.1)

## 2022-07-25 LAB — C-REACTIVE PROTEIN: CRP: 1.7 mg/dL — ABNORMAL HIGH (ref <1.0)

## 2022-07-25 LAB — GLUCOSE, CAPILLARY
Glucose-Capillary: 99 mg/dL (ref 70–99)
Glucose-Capillary: 97 mg/dL (ref 70–99)

## 2022-07-25 LAB — SEDIMENTATION RATE: Sed Rate: 17 mm/hr (ref 0–20)

## 2022-07-25 LAB — PROCALCITONIN: Procalcitonin: <0.1 ng/mL

## 2022-07-25 MED ORDER — ATORVASTATIN CALCIUM 20 MG PO TABS
40.0000 mg | ORAL_TABLET | Freq: Every evening | ORAL | Status: DC
  Administered 2022-07-26: 40 mg via ORAL
  Filled 2022-07-25: qty 2

## 2022-07-25 MED ORDER — SODIUM CHLORIDE 0.9 % IV SOLN
2.0000 g | Freq: Two times a day (BID) | INTRAVENOUS | Status: DC
  Administered 2022-07-26 (×3): 2 g via INTRAVENOUS
  Filled 2022-07-25: qty 2
  Filled 2022-07-25 (×3): qty 12.5

## 2022-07-25 MED ORDER — APIXABAN 2.5 MG PO TABS
2.5000 mg | ORAL_TABLET | Freq: Two times a day (BID) | ORAL | Status: DC
  Administered 2022-07-26 – 2022-07-27 (×3): 2.5 mg via ORAL
  Filled 2022-07-25 (×4): qty 1

## 2022-07-25 MED ORDER — INSULIN ASPART 100 UNIT/ML IJ SOLN: 0.0000 [IU] | Freq: Every day | INTRAMUSCULAR | Status: DC

## 2022-07-25 MED ORDER — ACETAMINOPHEN 650 MG RE SUPP: 650.0000 mg | Freq: Four times a day (QID) | RECTAL | Status: DC | PRN

## 2022-07-25 MED ORDER — SODIUM CHLORIDE 0.9 % IV SOLN
2.0000 g | Freq: Once | INTRAVENOUS | Status: AC
  Administered 2022-07-25: 2 g via INTRAVENOUS
  Filled 2022-07-25: qty 12.5

## 2022-07-25 MED ORDER — VANCOMYCIN HCL 750 MG/150ML IV SOLN
750.0000 mg | INTRAVENOUS | Status: DC
  Filled 2022-07-25: qty 150

## 2022-07-25 MED ORDER — INSULIN ASPART 100 UNIT/ML IJ SOLN
0.0000 [IU] | Freq: Three times a day (TID) | INTRAMUSCULAR | Status: DC
  Administered 2022-07-26: 1 [IU] via SUBCUTANEOUS
  Filled 2022-07-25 (×2): qty 1

## 2022-07-25 MED ORDER — HEPARIN SODIUM (PORCINE) 5000 UNIT/ML IJ SOLN: 5000.0000 [IU] | Freq: Three times a day (TID) | INTRAMUSCULAR | Status: DC

## 2022-07-25 MED ORDER — ACETAMINOPHEN 325 MG PO TABS: 650.0000 mg | ORAL_TABLET | Freq: Four times a day (QID) | ORAL | Status: DC | PRN

## 2022-07-25 MED ORDER — MAGNESIUM OXIDE -MG SUPPLEMENT 400 (240 MG) MG PO TABS
400.0000 mg | ORAL_TABLET | Freq: Every day | ORAL | Status: DC
  Administered 2022-07-26 – 2022-07-27 (×2): 400 mg via ORAL
  Filled 2022-07-25 (×2): qty 1

## 2022-07-25 MED ORDER — ONDANSETRON HCL 4 MG PO TABS: 4.0000 mg | ORAL_TABLET | Freq: Four times a day (QID) | ORAL | Status: DC | PRN

## 2022-07-25 MED ORDER — HYDRALAZINE HCL 20 MG/ML IJ SOLN: 5.0000 mg | Freq: Three times a day (TID) | INTRAMUSCULAR | Status: DC | PRN

## 2022-07-25 MED ORDER — ONDANSETRON HCL 4 MG/2ML IJ SOLN: 4.0000 mg | Freq: Four times a day (QID) | INTRAMUSCULAR | Status: DC | PRN

## 2022-07-25 MED ORDER — LACTATED RINGERS IV BOLUS
2000.0000 mL | Freq: Once | INTRAVENOUS | Status: AC
  Administered 2022-07-25: 2000 mL via INTRAVENOUS

## 2022-07-25 MED ORDER — SODIUM CHLORIDE 0.9 % IV SOLN: INTRAVENOUS | Status: DC

## 2022-07-25 MED ORDER — VANCOMYCIN HCL 1250 MG/250ML IV SOLN
1250.0000 mg | Freq: Once | INTRAVENOUS | Status: AC
  Administered 2022-07-25: 1250 mg via INTRAVENOUS
  Filled 2022-07-25: qty 250

## 2022-07-25 MED ORDER — SODIUM CHLORIDE 0.9 % IV BOLUS
1000.0000 mL | Freq: Once | INTRAVENOUS | Status: AC
  Administered 2022-07-25: 1000 mL via INTRAVENOUS

## 2022-07-25 MED ORDER — ORAL CARE MOUTH RINSE
15.0000 mL | OROMUCOSAL | Status: DC
  Administered 2022-07-25 – 2022-07-27 (×7): 15 mL via OROMUCOSAL

## 2022-07-25 MED ORDER — SENNOSIDES-DOCUSATE SODIUM 8.6-50 MG PO TABS: 1.0000 | ORAL_TABLET | Freq: Every evening | ORAL | Status: DC | PRN

## 2022-07-25 MED ORDER — ORAL CARE MOUTH RINSE: 15.0000 mL | OROMUCOSAL | Status: DC | PRN

## 2022-07-25 NOTE — Progress Notes (Addendum)
Patient arrived to 246 in NAD, VS stable, patient free from pain. Patient alert but oriented X 0. Patient oriented to room and call bell in reach.   Patient arrives with a suitcase full of clothes, 1 ortho shoe, and upper and lower dentures.

## 2022-07-25 NOTE — Consult Note (Signed)
ORTHOPAEDIC CONSULTATION  REQUESTING PHYSICIAN: Cox, Amy N, DO  Chief Complaint: Altered mental status  HPI: Charles Jennings is a 89 y.o. male who complains of altered mental status.  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  Had a wound VAC placed upon discharge at his most recent hospitalization about 5 days ago.  I was asked to reevaluate the left foot due to altered mental status.  X-rays were negative at this time.  Past Medical History:  Diagnosis Date   (HFpEF) heart failure with preserved ejection fraction (HCC)    Arthritis    Complete heart block ([place])    a.) s/p dual chamber Medtronic PPM placement 01/31/2016   Frequent PVCs    Hemorrhoids    a.) s/p banding 01/2021   HLD (hyperlipidemia)    Hypertension    Long term current use of antithrombotics/antiplatelets    a.) on DAPT (ASA + clopidogrel)   Peripheral vascular disease ([place])    Presence of permanent cardiac pacemaker 01/31/2016   a.) s/p Advisa DR MRI SureScan dual chamber PPM (SN: [id number])   SDH (subdural hematoma) (HCC)    Skin cancer    Type 2 diabetes mellitus treated with insulin [facility])    Past Surgical History:  Procedure Laterality Date   AMPUTATION TOE Left 06/30/2022   Procedure: AMPUTATION TOE - [place];  Surgeon: Sharlotte Alamo, DPM;  Location: [facility] ORS;  Service: Podiatry;  Laterality: Left;   AMPUTATION TOE Left 07/16/2022   Procedure: AMPUTATION TOE;  Surgeon: Samara Deist, DPM;  Location: [facility] ORS;  Service: Podiatry;  Laterality: Left;   EYE SURGERY     FEMORAL ENDARTERECTOMY Bilateral    HEMORRHOID BANDING N/A 01/2021   HERNIA REPAIR     double hernia   LOWER EXTREMITY ANGIOGRAPHY Left 06/12/2022   Procedure: Lower Extremity Angiography;  Surgeon: Algernon Huxley, MD;  Location: [place] CV LAB;  Service: Cardiovascular;  Laterality: Left;   LOWER EXTREMITY ANGIOGRAPHY Left  07/13/2022   Procedure: Lower Extremity Angiography;  Surgeon: Algernon Huxley, MD;  Location: [place] [facility];  Service: Cardiovascular;  Laterality: Left;   LOWER EXTREMITY ANGIOGRAPHY Left 07/14/2022   Procedure: Lower Extremity Angiography;  Surgeon: Algernon Huxley, MD;  Location: [place] [facility];  Service: Cardiovascular;  Laterality: Left;   LOWER EXTREMITY INTERVENTION Bilateral 06/14/2022   Procedure: LOWER EXTREMITY INTERVENTION;  Surgeon: Algernon Huxley, MD;  Location: [place] CV LAB;  Service: Cardiovascular;  Laterality: Bilateral;   PACEMAKER INSERTION Left 01/31/2016   Procedure: PACEMAKER INSERTION; Location: [facility]; Surgeon: Dimas Alexandria, MD   Social History   Socioeconomic History   Marital status: Widowed    Spouse name: Not on file   Number of children: Not on file   Years of education: Not on file   Highest education level: Not on file  Occupational History   Not on file  Tobacco Use   Smoking status: Never   Smokeless tobacco: Never  Substance and Sexual Activity   Alcohol use: Not Currently   Drug use: Never   Sexual activity: Not Currently  Other Topics Concern   Not on file  Social History Narrative   Lives alone   Social Determinants of Health   Financial Resource Strain: Not on file  Food Insecurity: No Food Insecurity (07/12/2022)   Hunger Vital Sign    Worried About Running Out of Food in  the Last Year: Never true    [place] in the Last Year: Never true  Transportation Needs: No Transportation Needs (07/12/2022)   PRAPARE - Hydrologist (Medical): No    Lack of Transportation (Non-Medical): No  Physical Activity: Not on file  Stress: Not on file  Social Connections: Not on file   Family History  Problem Relation Age of Onset   Liver disease Father    Hypertension Brother    Heart attack Brother    Alpha-1 antitrypsin deficiency Granddaughter    No Known Allergies Prior to Admission medications    Medication Sig Start Date End Date Taking? Authorizing Provider  acetaminophen (TYLENOL) 325 MG tablet Take 2 tablets (650 mg total) by mouth every 6 (six) hours as needed. 07/21/22 07/21/23 Yes Val Riles, MD  Amino Acids-Protein Hydrolys [facility]) LIQD Take by mouth. 07/24/22  Yes [provider]  apixaban (ELIQUIS) 2.5 MG TABS tablet Take 1 tablet (2.5 mg total) by mouth 2 (two) times daily. 07/21/22 07/21/23 Yes Val Riles, MD  aspirin EC 81 MG tablet Take 1 tablet (81 mg total) by mouth daily. Swallow whole. 07/22/22  Yes Val Riles, MD  atorvastatin (LIPITOR) 40 MG tablet Take 1 tablet (40 mg total) by mouth every evening. 06/15/22 07/25/22 Yes Richarda Osmond, MD  diltiazem (CARDIZEM CD) 240 MG 24 hr capsule Take 240 mg by mouth daily.   Yes [provider]  doxycycline (VIBRA-TABS) 100 MG tablet Take 1 tablet (100 mg total) by mouth every 12 (twelve) hours for 14 days. 07/21/22 08/04/22 Yes Val Riles, MD  gabapentin (NEURONTIN) 300 MG capsule Take 300 mg by mouth daily.   Yes [provider]  JARDIANCE 10 MG TABS tablet Take 10 mg by mouth daily.   Yes [provider]  levofloxacin (LEVAQUIN) 500 MG tablet Take 1 tablet (500 mg total) by mouth at bedtime for 14 days. 07/21/22 08/04/22 Yes Val Riles, MD  lisinopril (ZESTRIL) 40 MG tablet Take 40 mg by mouth daily.   Yes [provider]  loratadine (CLARITIN) 10 MG tablet Take 1 tablet by mouth daily. 11/30/15  Yes [provider]  magnesium oxide (MAG-OX) 400 MG tablet Take 1 tablet (400 mg total) by mouth daily for 14 days. 07/21/22 08/04/22 Yes Val Riles, MD  metFORMIN (GLUCOPHAGE) 500 MG tablet Take 500 mg by mouth 2 (two) times daily with a meal. 11/30/15  Yes [provider]  NOVOLOG MIX 70/30 FLEXPEN (70-30) 100 UNIT/ML FlexPen Inject 10 Units into the skin daily with breakfast. Hold if less than 150 07/21/22  Yes [provider]   DG Chest Port 1  View  Result Date: 07/25/2022 CLINICAL DATA:  Altered level of consciousness, sirs EXAM: PORTABLE CHEST 1 VIEW COMPARISON:  01/28/2016 FINDINGS: Single frontal view of the chest demonstrates dual lead pacer overlying left chest, proximal lead overlying right atrium and distal lead overlying right ventricle. Cardiac silhouette is unremarkable. Atherosclerosis of the aortic arch. There is patchy consolidation in the retrocardiac region which could reflect atelectasis or airspace disease. No effusion or pneumothorax. Minimally displaced left posterolateral eighth rib fracture. IMPRESSION: 1. Patchy retrocardiac consolidation may reflect atelectasis or airspace disease. 2. Minimally displaced acute posterolateral left eighth rib fracture. Electronically Signed   By: Randa Ngo M.D.   On: 07/25/2022 15:25   DG Foot Complete Left  Result Date: 07/25/2022 CLINICAL DATA:  Left foot pain, wound EXAM: LEFT FOOT - COMPLETE 3+ VIEW  COMPARISON:  07/13/2022 FINDINGS: Interval amputation of the second toe at the MTP joint. Previous amputation of the third toe. Diffuse bony demineralization. Flexion of the remaining fourth and fifth toes causes bony superimposition and reduced sensitivity of assessment of these toes. Subtle periosteal reaction along the shafts of the second, third, fourth metatarsals best appreciated on the oblique projection. No bony destructive findings. Hazy lucency along the presume soft tissue flap distal to the amputation sites could represent wound or small amount of gas in the soft tissues. There is also a suggestion of a dorsal soft tissue defect along the distal forefoot on the lateral projection possibly from ulceration/wound Os peroneus noted. Plantar calcaneal spur. Vascular calcifications noted. IMPRESSION: 1. Interval amputation of the second toe at the MTP joint. Previous amputation of the third toe. 2. Subtle periosteal reaction along the shafts of the second, third, and fourth  metatarsals, but no bony destructive findings specific for active osteomyelitis. 3. Hazy lucency along the presume soft tissue flap distal to the amputation sites could represent wound or small amount of gas in the soft tissues. There is also a dorsal soft tissue defect along the distal forefoot on the lateral projection possibly from ulceration/wound. Electronically Signed   By: Van Clines M.D.   On: 07/25/2022 12:29   CT HEAD WO CONTRAST (5MM)  Result Date: 07/25/2022 CLINICAL DATA:  Altered level of consciousness, headache EXAM: CT HEAD WITHOUT CONTRAST TECHNIQUE: Contiguous axial images were obtained from the base of the skull through the vertex without intravenous contrast. RADIATION DOSE REDUCTION: This exam was performed according to the departmental dose-optimization program which includes automated exposure control, adjustment of the mA and/or kV according to patient size and/or use of iterative reconstruction technique. COMPARISON:  01/28/2016 FINDINGS: Brain: There is atrophy and chronic small vessel disease changes. Chronic low-density right subdural hematoma noted measuring 4 mm in thickness compared with 13 mm previously. No mass effect or midline shift. No acute hemorrhage or hydrocephalus. Vascular: No hyperdense vessel or unexpected calcification. Skull: No acute calvarial abnormality. Sinuses/Orbits: No acute findings Other: None IMPRESSION: Decreasing size of the chronic right subdural hematoma, 4 mm currently compared to 13 mm previously. No mass effect or midline shift. Atrophy, chronic microvascular disease. No acute intracranial abnormality. Electronically Signed   By: Rolm Baptise M.D.   On: 07/25/2022 11:24    Positive ROS: All other systems have been reviewed and were otherwise negative with the exception of those mentioned in the HPI and as above.  12 point ROS was performed.  Physical Exam: General: Alert and oriented.  No apparent distress.  Vascular:  Left  foot:Dorsalis Pedis:  absent Posterior Tibial:  absent  Right foot: Dorsalis Pedis:  absent Posterior Tibial:  absent  Neuro:intact gross sensation.  Derm: Large 2 x 3 ulceration on the dorsal aspect of left foot with exposed tendon.  There is clear fluid drainage.  No obvious purulence.  No severe necrosis.  He is status post amputation of his left second and third toes and these incision sites look to be healing and are stable for now.  Ortho/MS: Status post amputation left second and third toes.    Assessment: Severe peripheral vascular disease with gangrene left foot Nonhealing ulcer left foot Recent abscess with cellulitis left foot  Plan: The wounds look to be stable.  There is no obvious purulent drainage.  X-rays are negative at this time.  I have will order an MRI just to rule out deep space abscess at this  time.  I suspect there will be a large amount of inflammatory tissue in the region consistent with his recent surgery.  Will reorder wound VAC as well.  For now a dry dressing was applied today.  We can follow loosely unless there is concern for abscess and need for surgical debridement.    Elesa Hacker, DPM Cell [phone]   07/25/2022 5:21 PM

## 2022-07-25 NOTE — ED Notes (Signed)
This RN and Anda Kraft, RN to bedside for in and out cath, and to insert rectal temp probe. Pt. Had 358m clear, yellow urine out with in and out cath.  Temp probe in place, effective.

## 2022-07-25 NOTE — ED Notes (Signed)
Dr. Ellender Hose notified that pt's temp is stil 96.1-96.3 deg. Even after many attempts made to warm him with fresh warm blankets and tucking in. Dr. Ellender Hose states to apply [facility].  Bair hugger applied to pt. Will continue to monitor pt's temp. Rectal temp probe remains in place and effective.

## 2022-07-25 NOTE — Consult Note (Signed)
Pharmacy Antibiotic Note  Charles Jennings is a 89 y.o. male with PMH including diabetes, lower limb ischemia, left third toe amputation 06/30/22 admitted on 07/12/2022 with  gangrenous changes to second toe on left foot with cellulitis .     Second toe amputation 2/11 and debridement of necrotic dorsal right foot wound. Discharged with Doxy 180m PO BID and Levaquin 5030mQD x 2 weeks on 2/16.  Re-admitted 2/20 with AMS.  Pharmacy consulted to dose Vancomycin and Cefepime for suspected Osteomyelitis.   Plan: Vancomycin 1250 mg IV x 1, followed by Vancomycin 750 mg IV Q 24 hrs. Goal AUC 400-550. Expected AUC: 489.4/Cmin 13.9 SCr used: 1.29  Cefepime 2 gm IV Q12H   Weight: 65.1 kg (143 lb 8.3 oz)  Temp (24hrs), Avg:96.5 F (35.8 C), Min:96.1 F (35.6 C), Max:97.7 F (36.5 C)  Recent Labs  Lab 07/19/22 0346 07/20/22 0540 07/21/22 0436 07/25/22 1050 07/25/22 1316  WBC 8.9 6.7 6.0 10.0  --   CREATININE 0.80 0.67 0.74 1.29*  --   LATICACIDVEN  --   --   --  1.5 1.8    Estimated Creatinine Clearance: 33.6 mL/min (A) (by C-G formula based on SCr of 1.29 mg/dL (H)).    No Known Allergies  Antimicrobials this admission: Vancomycin 2/20 >>  Cefepime 2/20 >>   Dose adjustments this admission: N/A  Microbiology results: 2/20 BCx: pending   Thank you for allowing pharmacy to be a part of this patient's care.  JuAlison Murray/20/2024 4:08 PM

## 2022-07-25 NOTE — Assessment & Plan Note (Signed)
Left foot wound - Podiatry has been consulted, we appreciate further recommendations - Continue cefepime and vancomycin - Blood cultures x 2 are collected and are in process

## 2022-07-25 NOTE — Assessment & Plan Note (Signed)
-   Continue contact precautions

## 2022-07-25 NOTE — Assessment & Plan Note (Signed)
-   Not a candidate for MRI at [facility]

## 2022-07-25 NOTE — Assessment & Plan Note (Addendum)
-   Metformin 500 mg p.o. twice daily and Jardiance 10 mg daily not resumed on admission - Insulin SSI with at bedtime coverage ordered

## 2022-07-25 NOTE — ED Notes (Signed)
Lab at bedside to draw cultures

## 2022-07-25 NOTE — Assessment & Plan Note (Addendum)
-   Per ED note, On EMS arrival at [facility], patient was noted to have SpO2 saturation of 84% on room air.  Patient oxygen supplementation was improved after 2 L nasal cannula placed and head repositioning - Etiology workup in progress - Check stat portable chest x-ray, to assess for pneumonia including aspiration pneumonia - Check procalcitonin - Incentive spirometry and flutter valve as appropriate and as tolerated - Aspiration precaution

## 2022-07-25 NOTE — ED Provider Notes (Signed)
[facility] Provider Note    Event Date/Time   First MD Initiated Contact with Patient 07/25/22 1029     (approximate)   History   Altered Mental Status (Pt. To ED via EMS from [place] for AMS. Pt. O2 sat was 84% on scene, when head repositioned and 2L [place] applied, sats returned to 100%. Staff states pt. Is baseline dementia and A+O x1, however responses are much more limited this AM. Upon arrival to ED pt withdraws/grimmaces to painful stimuli. No other response.)   HPI  HARGUN GILLIG is a 89 y.o. male here with altered mental status. History limited 2/2 dementia, confusion. Per report, EMS was called out to scene due to pt being confused, off his baseline. When they arrived pt was hypoxic but improved with repositioning. He normally is able to answer yes/no questions but has been less responsive. No other complaints. He grimaces to pain only on my review.   Level 5 caveat invoked as remainder of history, ROS, and physical exam limited due to patient's altered mental status.         Physical Exam   Triage Vital Signs: ED Triage Vitals  Enc Vitals Group     BP 07/25/22 1032 (!) 144/53     Pulse Rate 07/25/22 1032 75     Resp 07/25/22 1032 15     Temp 07/25/22 1032 97.7 F (36.5 C)     Temp Source 07/25/22 1032 Axillary     SpO2 07/25/22 1028 93 %     Weight 07/25/22 1034 143 lb 8.3 oz (65.1 kg)     Height --      Head Circumference --      Peak Flow --      Pain Score --      Pain Loc --      Pain Edu? --      Excl. in [place]? --     Most recent vital signs: Vitals:   07/25/22 1700 07/25/22 1738  BP: (!) 131/55 (!) 142/64  Pulse: 79 86  Resp: 19 16  Temp: 98.2 F (36.8 C) 98 F (36.7 C)  SpO2: 96% 95%     General: Awake, no distress.  CV:  Good peripheral perfusion. RRR. Resp:  Normal effort. Lungs clear to auscultation bilaterally. Abd:  No distention. No tenderness. Other:  Grimaces to pain b/l UE and LE. LLE with  open wound and exposed tendons along dorsum of foot, with moderate surrounding erythema. No fluctuance.   ED Results / Procedures / Treatments   Labs (all labs ordered are listed, but only abnormal results are displayed) Labs Reviewed  COMPREHENSIVE METABOLIC PANEL - Abnormal; Notable for the following components:      Result Value   Creatinine, Ser 1.29 (*)    Calcium 8.3 (*)    Total Protein 5.3 (*)    Albumin 2.4 (*)    AST 42 (*)    Alkaline Phosphatase 173 (*)    Total Bilirubin 1.3 (*)    GFR, Estimated 52 (*)    All other components within normal limits  C-REACTIVE PROTEIN - Abnormal; Notable for the following components:   CRP 1.7 (*)    All other components within normal limits  URINALYSIS, ROUTINE W REFLEX MICROSCOPIC - Abnormal; Notable for the following components:   Color, Urine YELLOW (*)    APPearance CLEAR (*)    Glucose, UA >=500 (*)    Hgb urine dipstick MODERATE (*)  Ketones, ur 20 (*)    Protein, ur 30 (*)    All other components within normal limits  CULTURE, BLOOD (ROUTINE X 2)  CULTURE, BLOOD (ROUTINE X 2)  CBC WITH DIFFERENTIAL/PLATELET  LACTIC ACID, PLASMA  LACTIC ACID, PLASMA  SEDIMENTATION RATE  PROCALCITONIN  GLUCOSE, CAPILLARY  PHOSPHORUS  MAGNESIUM  BASIC METABOLIC PANEL  CBC  PROCALCITONIN     EKG Atrial sensed V paced rhythm. VR 85.    RADIOLOGY DG Foot Left: subtle periosteal reaction along 2-4th metatarsals, hazy lucency along soft tissue flap CT Head: NAICA    I also independently reviewed and agree with radiologist interpretations.   PROCEDURES:  Critical Care performed: Yes, see critical care procedure note(s)  .Critical Care  Performed by: Duffy Bruce, MD Authorized by: Duffy Bruce, MD   Critical care provider statement:    Critical care time (minutes):  30   Critical care time was exclusive of:  Separately billable procedures and treating other patients   Critical care was necessary to treat or  prevent imminent or life-threatening deterioration of the following conditions:  Cardiac failure, circulatory failure, respiratory failure and sepsis   Critical care was time spent personally by me on the following activities:  Development of treatment plan with patient or surrogate, discussions with consultants, evaluation of patient's response to treatment, examination of patient, ordering and review of laboratory studies, ordering and review of radiographic studies, ordering and performing treatments and interventions, pulse oximetry, re-evaluation of patient's condition and review of old [place] ED: Medications  acetaminophen (TYLENOL) tablet 650 mg (has no administration in time range)    Or  acetaminophen (TYLENOL) suppository 650 mg (has no administration in time range)  ondansetron (ZOFRAN) tablet 4 mg (has no administration in time range)    Or  ondansetron (ZOFRAN) injection 4 mg (has no administration in time range)  senna-docusate (Senokot-S) tablet 1 tablet (has no administration in time range)  0.9 %  sodium chloride infusion ( Intravenous New Bag/Given 07/25/22 1812)  insulin aspart (novoLOG) injection 0-9 Units ( Subcutaneous Not Given 07/25/22 1809)  insulin aspart (novoLOG) injection 0-5 Units (has no administration in time range)  vancomycin (VANCOREADY) IVPB 750 mg/150 mL (has no administration in time range)  ceFEPIme (MAXIPIME) 2 g in sodium chloride 0.9 % 100 mL IVPB (has no administration in time range)  Oral care mouth rinse (has no administration in time range)  Oral care mouth rinse (has no administration in time range)  atorvastatin (LIPITOR) tablet 40 mg (has no administration in time range)  magnesium oxide (MAG-OX) tablet 400 mg (has no administration in time range)  apixaban (ELIQUIS) tablet 2.5 mg (has no administration in time range)  hydrALAZINE (APRESOLINE) injection 5 mg (has no administration in time range)  sodium chloride 0.9 %  bolus 1,000 mL (0 mLs Intravenous Stopped 07/25/22 1300)  ceFEPIme (MAXIPIME) 2 g in sodium chloride 0.9 % 100 mL IVPB (0 g Intravenous Stopped 07/25/22 1200)  vancomycin (VANCOREADY) IVPB 1250 mg/250 mL (0 mg Intravenous Stopped 07/25/22 1528)  lactated ringers bolus 2,000 mL (2,000 mLs Intravenous New Bag/Given 07/25/22 1640)     IMPRESSION / MDM / [place] / ED COURSE  I reviewed the triage vital signs and the nursing notes.                              Differential diagnosis includes, but is not limited  to, sepsis, CVA, seizure, metabolic encephalopathy, AKI, polypharmacy  Patient's presentation is most consistent with acute presentation with potential threat to life or bodily function.  The patient is on the cardiac monitor to evaluate for evidence of arrhythmia and/or significant heart rate changes.**}  89 yo M here with AMS. Pt has extensive PMHx including recent admission for foot cellulitis/abscess s/p debridement. He arrives with moderate erythema of the area again and no wound vac in place. He is hypothermic with mild AKI. CRP elevated at 1.7. Pt started on empiric broad spectrum abx and will admit for further management. CT head reviewed and is negative. No focal neuro deficits.   FINAL CLINICAL IMPRESSION(S) / ED DIAGNOSES   Final diagnoses:  Acute encephalopathy  Cellulitis of left foot     Rx / DC Orders   ED Discharge Orders     None        Note:  This document was prepared using Dragon voice recognition software and may include unintentional dictation errors.   Duffy Bruce, MD 07/25/22 [phone]

## 2022-07-25 NOTE — Assessment & Plan Note (Signed)
-   History of subdural hematoma in August 2017 - CT on admission: Read as decreasing size of chronic right subdural hematoma, no mass effect or midline shift

## 2022-07-25 NOTE — Assessment & Plan Note (Signed)
-   Home lisinopril and diltiazem not resumed on admission due to acute kidney injury and possible sepsis/SIRS admission - Hydralazine 5 mg IV every 8 hours as needed for SBP greater 180, 4 days ordered

## 2022-07-25 NOTE — Progress Notes (Signed)
PHARMACY -  BRIEF ANTIBIOTIC NOTE   Pharmacy has received consult(s) for vancomycin and cefepime from an ED provider.  [name] profile has been reviewed for ht/wt/allergies/indication/available labs.    One time order(s) placed for cefepime 2 gm IV x 1 and vancomycin 1250 mg IV x 1  Further antibiotics/pharmacy consults should be ordered by admitting physician if indicated.                       Thank you, Alison Murray 07/25/2022  10:57 AM

## 2022-07-25 NOTE — Assessment & Plan Note (Signed)
-   No prior CKD - Baseline serum creatinine has been 0.67-0.88/eGFR > 60 - I suspect the etiology secondary to poor p.o. intake however given hypothermia, source of infection, acute hypoxic respiratory failure, intrarenal secondary to severe sepsis cannot be excluded at this time - Treat per above

## 2022-07-25 NOTE — Assessment & Plan Note (Addendum)
-   Minimally displaced acute posterior lateral eighth rib fracture - Continue monitoring and symptomatic support as needed

## 2022-07-25 NOTE — Assessment & Plan Note (Addendum)
-   Patient does not meet criteria for SIRS and/or sepsis with regards to vital signs or labs at this time - However given altered mental status, hypothermia, acute hypoxic respiratory failure, and acute kidney injury this is a change from prior hospitalization especially on 07/12/2022 - With possible infectious source of left foot wound, currently without a wound VAC, severe sepsis/bacteremia with multiorgan involvement cannot be excluded at this time - Blood cultures x 2 are in process - Check procalcitonin, portable chest x-ray to assess for pneumonia especially aspiration pneumonia - Continue vancomycin and cefepime per pharmacy - Patient is maintaining appropriate MAP, will order LR 2 L bolus and then initiate sodium chloride and 150 mL/h, 1 day ordered - Admit to progressive cardiac, inpatient

## 2022-07-25 NOTE — H&P (Addendum)
History and Physical   Charles Jennings [id number] DOB: May 01, 1928 DOA: 07/25/2022  PCP: Albina Billet, MD  Outpatient Specialists: Dr. Cleda Mccreedy, podiatry Patient coming from: [place] via EMS  I have personally briefly reviewed patient's old medical records in [place].  Chief Concern: Altered mental status  HPI: 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  Initial vitals in the ED showed temperature of 97.7, respiration rate of 12, heart rate of 74, blood pressure 142/35, SpO2 of 94% on room air.  Serum sodium is 135, potassium 3.8, chloride 100, bicarb 23, nonfasting blood glucose 91, BUN of 20, serum creatinine 1.29, EGFR 52, WBC 10.0, hemoglobin 13.3, platelets of 319.  Lactic acid was 1.5 and on repeat was 1.8.  Blood cultures x 2 ordered and are in process.  ED treatment: Vancomycin, cefepime per pharmacy  EDP consulted podiatry. ----------------------------- At bedside, bear hugger in place. Patient is sleepy and able to be aroused. When aroused, he was able to tell me his name, age of 89, current location of [facility]. He was not able to tell me the current year, month, or president accurately. He states that, 'Trump, I think.'  He denies chest pain, nausea, vomiting, shortness of breath, abdominal pain, dysuria, diarrhea. He endorses weakness.   Social history: Patient is currently at nursing facility.  Prior to last hospitalization he lives on his own and performs his own ADLs.  He formerly worked for [employer] and then [employer] before retiring.  He denies tobacco, EtOH, recreational drug use.  ROS: Unable to fully complete as patient is altered from baseline  ED Course: Gust with emergency  medicine provider, patient requiring hospitalization for chief concerns of altered mental status  Assessment/Plan  Principal Problem:   Altered mental status Active Problems:   History of MRSA infection   Insulin dependent type 2 diabetes mellitus (HCC)   Mixed hyperlipidemia   Benign essential HTN   Complete heart block (HCC)   Subdural hemorrhage (HCC)   Osteomyelitis (HCC)   PAD (peripheral artery disease) (HCC)   Hypothermia   Acute hypoxic respiratory failure ([place])   AKI (acute kidney injury) ([place])   History of cardiac pacemaker   Pressure injury of skin   Left rib fracture   Assessment and Plan:  * Altered mental status - Etiology workup in progress at this time, differentials include severe sepsis with multiorgan involvement, bacteremia, community-acquired pneumonia, hypoglycemia in setting of continued insulin use at facility with acute kidney injury - Check procalcitonin - Continue with cefepime and vancomycin per pharmacy - LR 2 L bolus and sodium chloride IVF at 150 mL/h - Admit to progressive cardiac, inpatient  History of MRSA infection - Continue contact precautions  Insulin dependent type 2 diabetes mellitus (HCC) - Metformin 500 mg p.o. twice daily and Jardiance 10 mg daily not resumed on admission - Insulin SSI with at bedtime coverage ordered  Benign essential HTN - Home lisinopril and diltiazem not resumed on admission due to acute kidney injury and possible sepsis/SIRS admission - Hydralazine 5 mg IV every 8 hours as needed for SBP greater 180, 4 days ordered  Left rib fracture - Minimally displaced acute posterior lateral eighth rib fracture - Continue monitoring and symptomatic support as needed  History of cardiac pacemaker - Not a candidate  for MRI at [place]  AKI (acute kidney injury) ([place]) - No prior CKD - Baseline serum creatinine has been 0.67-0.88/eGFR > 60 - I suspect the etiology secondary to poor p.o. intake however given  hypothermia, source of infection, acute hypoxic respiratory failure, intrarenal secondary to severe sepsis cannot be excluded at this time - Treat per above  Acute hypoxic respiratory failure ([place]) - Per ED note, On EMS arrival at [facility], patient was noted to have SpO2 saturation of 84% on room air.  Patient oxygen supplementation was improved after 2 L nasal cannula placed and head repositioning - Etiology workup in progress - Check stat portable chest x-ray, to assess for pneumonia including aspiration pneumonia - Check procalcitonin - Incentive spirometry and flutter valve as appropriate and as tolerated - Aspiration precaution  Hypothermia - Patient does not meet criteria for SIRS and/or sepsis with regards to vital signs or labs at this time - However given altered mental status, hypothermia, acute hypoxic respiratory failure, and acute kidney injury this is a change from prior hospitalization especially on 07/12/2022 - With possible infectious source of left foot wound, currently without a wound VAC, severe sepsis/bacteremia with multiorgan involvement cannot be excluded at this time - Blood cultures x 2 are in process - Check procalcitonin, portable chest x-ray to assess for pneumonia especially aspiration pneumonia - Continue vancomycin and cefepime per pharmacy - Patient is maintaining appropriate MAP, will order LR 2 L bolus and then initiate sodium chloride and 150 mL/h, 1 day ordered - Admit to progressive cardiac, inpatient  Osteomyelitis ([place]) Left foot wound - Podiatry has been consulted, we appreciate further recommendations - Continue cefepime and vancomycin - Blood cultures x 2 are collected and are in process  Subdural hemorrhage ([place]) - History of subdural hematoma in August 2017 - CT on admission: Read as decreasing size of chronic right subdural hematoma, no mass effect or midline shift  Complete heart block ([place]) Status post dual-chamber pacemaker  placement in August 2017  Chart reviewed.   Inpatient hospitalization from 07/12/2022 to 07/21/2022: Patient is status post mechanical thrombectomy of left SFA popliteal with popliteal artery and tibial peroneal trunk.  Patient received tPA at the time of procedure and started on continuous tPA infusion overnight with right femoral triple-lumen catheter placement on 07/13/2022.  On 07/14/2022, patient received left lower extremity angiogram with mechanical thrombectomy of the left SFA, popliteal artery, tibioperoneal trunk, proximal posterior tibial artery with the penumbra CAT 6 device.  Stent placement to the left distal SFA and above-knee popliteal artery with a 6 mm diameter by 15 cm stent for residual thrombus and stenosis after thrombectomy.  StarClose closure device at the right femoral artery placement.  Patient received heparin IV infusion, transition to Eliquis 2.5 mg p.o. twice daily along with aspirin 81 mg daily per vascular surgery.  Via vascular service in 07/14/2022.  Patient is status post left toe amputation on 07/16/2022.  Patient received cefepime and Flagyl, which was discontinued on 2/12 due to possible toxicity and patient developing paranoid thoughts and delirium.  Patient was then transition to Zyvox 6 mg p.o. twice daily and continued on Unasyn.  OR wound culture grew MRSA, and ID was consulted and recommended doxycycline and Levaquin for 2 weeks.  Patient continued wound care management with wound VAC follow-up per vascular follow-up outpatient.  DVT prophylaxis: Heparin 5000 units subcutaneous every 8 hours Code Status: DNR Diet: N.p.o. except for sips with meds Family Communication: Daughter, Gabriel Cirri, called and updated at  [phone] Disposition Plan: Pending clinical course Consults called: Podiatry per EDP and via epic order by myself Admission status: Inpatient, progressive cardiac  Past Medical History:  Diagnosis Date   (HFpEF) heart failure with preserved ejection  fraction (HCC)    Arthritis    Complete heart block ([place])    a.) s/p dual chamber Medtronic PPM placement 01/31/2016   Frequent PVCs    Hemorrhoids    a.) s/p banding 01/2021   HLD (hyperlipidemia)    Hypertension    Long term current use of antithrombotics/antiplatelets    a.) on DAPT (ASA + clopidogrel)   Peripheral vascular disease ([place])    Presence of permanent cardiac pacemaker 01/31/2016   a.) s/p Advisa DR MRI SureScan dual chamber PPM (SN: [id number])   SDH (subdural hematoma) (HCC)    Skin cancer    Type 2 diabetes mellitus treated with insulin [facility])    Past Surgical History:  Procedure Laterality Date   AMPUTATION TOE Left 06/30/2022   Procedure: AMPUTATION TOE - [place];  Surgeon: Sharlotte Alamo, DPM;  Location: [facility] ORS;  Service: Podiatry;  Laterality: Left;   AMPUTATION TOE Left 07/16/2022   Procedure: AMPUTATION TOE;  Surgeon: Samara Deist, DPM;  Location: [facility] ORS;  Service: Podiatry;  Laterality: Left;   EYE SURGERY     FEMORAL ENDARTERECTOMY Bilateral    HEMORRHOID BANDING N/A 01/2021   HERNIA REPAIR     double hernia   LOWER EXTREMITY ANGIOGRAPHY Left 06/12/2022   Procedure: Lower Extremity Angiography;  Surgeon: Algernon Huxley, MD;  Location: [place] [facility];  Service: Cardiovascular;  Laterality: Left;   LOWER EXTREMITY ANGIOGRAPHY Left 07/13/2022   Procedure: Lower Extremity Angiography;  Surgeon: Algernon Huxley, MD;  Location: [place] [facility];  Service: Cardiovascular;  Laterality: Left;   LOWER EXTREMITY ANGIOGRAPHY Left 07/14/2022   Procedure: Lower Extremity Angiography;  Surgeon: Algernon Huxley, MD;  Location: [place] [facility];  Service: Cardiovascular;  Laterality: Left;   LOWER EXTREMITY INTERVENTION Bilateral 06/14/2022   Procedure: LOWER EXTREMITY INTERVENTION;  Surgeon: Algernon Huxley, MD;  Location: [place] [facility];  Service: Cardiovascular;  Laterality: Bilateral;   PACEMAKER INSERTION Left 01/31/2016   Procedure:  PACEMAKER INSERTION; Location: [facility]; Surgeon: Dimas Alexandria, MD   Social History:  reports that he has never smoked. He has never used smokeless tobacco. He reports that he does not currently use alcohol. He reports that he does not use drugs.  No Known Allergies Family History  Problem Relation Age of Onset   Liver disease Father    Hypertension Brother    Heart attack Brother    Alpha-1 antitrypsin deficiency Granddaughter    Family history: Family history reviewed and not pertinent.  Prior to Admission medications   Medication Sig Start Date End Date Taking? Authorizing Provider  acetaminophen (TYLENOL) 325 MG tablet Take 2 tablets (650 mg total) by mouth every 6 (six) hours as needed. 07/21/22 07/21/23 Yes Val Riles, MD  Amino Acids-Protein Hydrolys [facility]) LIQD Take by mouth. 07/24/22  Yes [provider]  apixaban (ELIQUIS) 2.5 MG TABS tablet Take 1 tablet (2.5 mg total) by mouth 2 (two) times daily. 07/21/22 07/21/23 Yes Val Riles, MD  aspirin EC 81 MG tablet Take 1 tablet (81 mg total) by mouth daily. Swallow whole. 07/22/22  Yes Val Riles, MD  atorvastatin (LIPITOR) 40 MG tablet Take 1 tablet (40 mg total) by mouth every evening. 06/15/22 07/25/22 Yes Richarda Osmond, MD  diltiazem (CARDIZEM CD) 240 MG 24 hr capsule Take 240 mg by mouth daily.   Yes [provider]  doxycycline (VIBRA-TABS) 100 MG tablet Take 1 tablet (100 mg total) by mouth every 12 (twelve) hours for 14 days. 07/21/22 08/04/22 Yes Val Riles, MD  gabapentin (NEURONTIN) 300 MG capsule Take 300 mg by mouth daily.   Yes [provider]  JARDIANCE 10 MG TABS tablet Take 10 mg by mouth daily.   Yes [provider]  levofloxacin (LEVAQUIN) 500 MG tablet Take 1 tablet (500 mg total) by mouth at bedtime for 14 days. 07/21/22 08/04/22 Yes Val Riles, MD  lisinopril (ZESTRIL) 40 MG tablet Take 40 mg by mouth daily.   Yes [provider]  loratadine (CLARITIN) 10  MG tablet Take 1 tablet by mouth daily. 11/30/15  Yes [provider]  magnesium oxide (MAG-OX) 400 MG tablet Take 1 tablet (400 mg total) by mouth daily for 14 days. 07/21/22 08/04/22 Yes Val Riles, MD  metFORMIN (GLUCOPHAGE) 500 MG tablet Take 500 mg by mouth 2 (two) times daily with a meal. 11/30/15  Yes [provider]  NOVOLOG MIX 70/30 FLEXPEN (70-30) 100 UNIT/ML FlexPen Inject 10 Units into the skin daily with breakfast. Hold if less than 150 07/21/22  Yes [provider]   Physical Exam: Vitals:   07/25/22 1600 07/25/22 1630 07/25/22 1700 07/25/22 1738  BP: (!) 112/46 (!) 110/53 (!) 131/55 (!) 142/64  Pulse: 78 79 79 86  Resp: 15 15 19 16  $ Temp: (!) 97.5 F (36.4 C) 98 F (36.7 C) 98.2 F (36.8 C) 98 F (36.7 C)  TempSrc:    Oral  SpO2: 94% 95% 96% 95%  Weight:       Constitutional: appears frail, lethargic, NAD, calm, comfortable Eyes: PERRL, lids and conjunctivae normal ENMT: Mucous membranes are moist. Posterior pharynx clear of any exudate or lesions. Age-appropriate dentition. Hearing appropriate Neck: normal, supple, no masses, no thyromegaly Respiratory: clear to auscultation bilaterally, no wheezing, no crackles. Normal respiratory effort. No accessory muscle use.  Cardiovascular: Regular rate and rhythm, no murmurs / rubs / gallops. No extremity edema. 2+ pedal pulses. No carotid bruits.  Abdomen: no tenderness, no masses palpated, no hepatosplenomegaly. Bowel sounds positive.  Musculoskeletal: no clubbing / cyanosis. No joint deformity upper and lower extremities. Good ROM, no contractures, no atrophy. Normal muscle tone.  Skin: left foot.  Neurologic: Sensation intact. Strength 5/5 in all 4.  Psychiatric: Normal judgment and insight. Alert and oriented x 3. Normal mood.   EKG: independently reviewed, showing atrial sensed, ventricular paced rhythm with rate of 85, QTc 487  Chest x-ray on Admission: I personally reviewed and I agree with  radiologist reading as below.  DG Chest Port 1 View  Result Date: 07/25/2022 CLINICAL DATA:  Altered level of consciousness, sirs EXAM: PORTABLE CHEST 1 VIEW COMPARISON:  01/28/2016 FINDINGS: Single frontal view of the chest demonstrates dual lead pacer overlying left chest, proximal lead overlying right atrium and distal lead overlying right ventricle. Cardiac silhouette is unremarkable. Atherosclerosis of the aortic arch. There is patchy consolidation in the retrocardiac region which could reflect atelectasis or airspace disease. No effusion or pneumothorax. Minimally displaced left posterolateral eighth rib fracture. IMPRESSION: 1. Patchy retrocardiac consolidation may reflect atelectasis or airspace disease. 2. Minimally displaced acute posterolateral left eighth rib fracture. Electronically Signed   By: Randa Ngo M.D.   On: 07/25/2022 15:25   DG Foot Complete Left  Result Date: 07/25/2022 CLINICAL DATA:  Left foot pain, wound EXAM: LEFT FOOT - COMPLETE 3+ VIEW COMPARISON:  07/13/2022 FINDINGS: Interval amputation of the second toe at the MTP joint. Previous amputation of the third toe. Diffuse bony demineralization. Flexion of the remaining fourth and fifth toes causes bony superimposition and reduced sensitivity of assessment of these toes. Subtle periosteal reaction along the shafts of the second, third, fourth metatarsals best appreciated on the oblique projection. No bony destructive findings. Hazy lucency along the presume soft tissue flap distal to the amputation sites could represent wound or small amount of gas in the soft tissues. There is also a suggestion of a dorsal soft tissue defect along the distal forefoot on the lateral projection possibly from ulceration/wound Os peroneus noted. Plantar calcaneal spur. Vascular calcifications noted. IMPRESSION: 1. Interval amputation of the second toe at the MTP joint. Previous amputation of the third toe. 2. Subtle periosteal reaction along the  shafts of the second, third, and fourth metatarsals, but no bony destructive findings specific for active osteomyelitis. 3. Hazy lucency along the presume soft tissue flap distal to the amputation sites could represent wound or small amount of gas in the soft tissues. There is also a dorsal soft tissue defect along the distal forefoot on the lateral projection possibly from ulceration/wound. Electronically Signed   By: Van Clines M.D.   On: 07/25/2022 12:29   CT HEAD WO CONTRAST (5MM)  Result Date: 07/25/2022 CLINICAL DATA:  Altered level of consciousness, headache EXAM: CT HEAD WITHOUT CONTRAST TECHNIQUE: Contiguous axial images were obtained from the base of the skull through the vertex without intravenous contrast. RADIATION DOSE REDUCTION: This exam was performed according to the departmental dose-optimization program which includes automated exposure control, adjustment of the mA and/or kV according to patient size and/or use of iterative reconstruction technique. COMPARISON:  01/28/2016 FINDINGS: Brain: There is atrophy and chronic small vessel disease changes. Chronic low-density right subdural hematoma noted measuring 4 mm in thickness compared with 13 mm previously. No mass effect or midline shift. No acute hemorrhage or hydrocephalus. Vascular: No hyperdense vessel or unexpected calcification. Skull: No acute calvarial abnormality. Sinuses/Orbits: No acute findings Other: None IMPRESSION: Decreasing size of the chronic right subdural hematoma, 4 mm currently compared to 13 mm previously. No mass effect or midline shift. Atrophy, chronic microvascular disease. No acute intracranial abnormality. Electronically Signed   By: Rolm Baptise M.D.   On: 07/25/2022 11:24    Labs on Admission: I have personally reviewed following labs  CBC: Recent Labs  Lab 07/19/22 0346 07/20/22 0540 07/21/22 0436 07/25/22 1050  WBC 8.9 6.7 6.0 10.0  NEUTROABS  --   --   --  7.1  HGB 13.1 11.9* 12.6* 13.3   HCT 40.0 36.3* 37.8* 41.0  MCV 87.0 87.7 87.3 88.9  PLT 228 203 213 [id number]   Basic Metabolic Panel: Recent Labs  Lab 07/19/22 0346 07/20/22 0540 07/21/22 0436 07/25/22 1050  NA 137 138 133* 135  K 3.5 3.3* 3.3* 3.8  CL 104 106 100 100  CO2 27 26 26 23  $ GLUCOSE 184* 172* 145* 91  BUN 13 10 8 20  $ CREATININE 0.80 0.67 0.74 1.29*  CALCIUM 7.3* 6.9* 7.0* 8.3*  MG 1.8  --  1.4*  --   PHOS 2.6  --  2.3*  --    GFR: Estimated Creatinine Clearance: 33.6 mL/min (A) (by C-G formula based on SCr of 1.29 mg/dL (H)).  Liver Function Tests: Recent Labs  Lab 07/25/22 1050  AST 42*  ALT  27  ALKPHOS 173*  BILITOT 1.3*  PROT 5.3*  ALBUMIN 2.4*   CBG: Recent Labs  Lab 07/20/22 1604 07/20/22 2140 07/21/22 0750 07/21/22 1151 07/25/22 1808  GLUCAP 188* 120* 133* 228* 99   Urine analysis:    Component Value Date/Time   COLORURINE YELLOW (A) 07/25/2022 1212   APPEARANCEUR CLEAR (A) 07/25/2022 1212   APPEARANCEUR Clear 01/22/2013 1826   LABSPEC 1.017 07/25/2022 1212   LABSPEC 1.005 01/22/2013 1826   PHURINE 5.0 07/25/2022 1212   GLUCOSEU >=500 (A) 07/25/2022 1212   GLUCOSEU 50 mg/dL 01/22/2013 1826   HGBUR MODERATE (A) 07/25/2022 1212   BILIRUBINUR NEGATIVE 07/25/2022 1212   BILIRUBINUR Negative 01/22/2013 1826   KETONESUR 20 (A) 07/25/2022 1212   PROTEINUR 30 (A) 07/25/2022 1212   NITRITE NEGATIVE 07/25/2022 1212   LEUKOCYTESUR NEGATIVE 07/25/2022 1212   LEUKOCYTESUR Negative 01/22/2013 1826   CRITICAL CARE Performed by: Dr. Tobie Poet  Total critical care time: 35 minutes  Critical care time was exclusive of separately billable procedures and treating other patients.  Critical care was necessary to treat or prevent imminent or life-threatening deterioration.  Critical care was time spent personally by me on the following activities: development of treatment plan with patient and/or surrogate as well as nursing, discussions with consultants, evaluation of patient's response  to treatment, examination of patient, obtaining history from patient or surrogate, ordering and performing treatments and interventions, ordering and review of laboratory studies, ordering and review of radiographic studies, pulse oximetry and re-evaluation of patient's condition.  This document was prepared using Dragon Voice Recognition software and may include unintentional dictation errors.  Dr. Tobie Poet [facility]  If 7PM-7AM, please contact overnight-coverage provider If 7AM-7PM, please contact day coverage provider [email or web address]  07/25/2022, 6:15 PM

## 2022-07-25 NOTE — Assessment & Plan Note (Signed)
Status post dual-chamber pacemaker placement in August 2017

## 2022-07-25 NOTE — Hospital Course (Addendum)
Mr. 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  Initial vitals in the ED showed temperature of 97.7, respiration rate of 12, heart rate of 74, blood pressure 142/35, SpO2 of 94% on room air.  Serum sodium is 135, potassium 3.8, chloride 100, bicarb 23, nonfasting blood glucose 91, BUN of 20, serum creatinine 1.29, EGFR 52, WBC 10.0, hemoglobin 13.3, platelets of 319.  Lactic acid was 1.5 and on repeat was 1.8.  Blood cultures x 2 ordered and are in process.  ED treatment: Vancomycin, cefepime per pharmacy  EDP consulted podiatry.

## 2022-07-25 NOTE — Progress Notes (Addendum)
Spoke with Charles Jennings patient's daughter and updated her on her dad.  Notified MD via secure chat that the daughter says her Dad is a DNR. There is no yellow DNR form on the chart from the SNF. I tried to call the nurse at his SNF but could not get anyone to answer the phone.

## 2022-07-25 NOTE — Assessment & Plan Note (Addendum)
-   Etiology workup in progress at this time, differentials include severe sepsis with multiorgan involvement, bacteremia, community-acquired pneumonia, hypoglycemia in setting of continued insulin use at facility with acute kidney injury - Check procalcitonin - Continue with cefepime and vancomycin per pharmacy - LR 2 L bolus and sodium chloride IVF at 150 mL/h - Admit to progressive cardiac, inpatient

## 2022-07-26 DIAGNOSIS — N179 Acute kidney failure, unspecified: Secondary | ICD-10-CM

## 2022-07-26 DIAGNOSIS — Z8614 Personal history of Methicillin resistant Staphylococcus aureus infection: Secondary | ICD-10-CM

## 2022-07-26 DIAGNOSIS — R400 Somnolence: Secondary | ICD-10-CM | POA: Diagnosis not present

## 2022-07-26 DIAGNOSIS — E119 Type 2 diabetes mellitus without complications: Secondary | ICD-10-CM

## 2022-07-26 DIAGNOSIS — M869 Osteomyelitis, unspecified: Secondary | ICD-10-CM

## 2022-07-26 DIAGNOSIS — I6200 Nontraumatic subdural hemorrhage, unspecified: Secondary | ICD-10-CM

## 2022-07-26 DIAGNOSIS — Z950 Presence of cardiac pacemaker: Secondary | ICD-10-CM

## 2022-07-26 DIAGNOSIS — T68XXXA Hypothermia, initial encounter: Secondary | ICD-10-CM

## 2022-07-26 DIAGNOSIS — I10 Essential (primary) hypertension: Secondary | ICD-10-CM

## 2022-07-26 DIAGNOSIS — L8990 Pressure ulcer of unspecified site, unspecified stage: Secondary | ICD-10-CM

## 2022-07-26 DIAGNOSIS — I739 Peripheral vascular disease, unspecified: Secondary | ICD-10-CM

## 2022-07-26 DIAGNOSIS — S2232XA Fracture of one rib, left side, initial encounter for closed fracture: Secondary | ICD-10-CM

## 2022-07-26 DIAGNOSIS — E782 Mixed hyperlipidemia: Secondary | ICD-10-CM

## 2022-07-26 DIAGNOSIS — Z794 Long term (current) use of insulin: Secondary | ICD-10-CM

## 2022-07-26 DIAGNOSIS — J9601 Acute respiratory failure with hypoxia: Secondary | ICD-10-CM

## 2022-07-26 DIAGNOSIS — I442 Atrioventricular block, complete: Secondary | ICD-10-CM

## 2022-07-26 LAB — BASIC METABOLIC PANEL
Anion gap: 11 (ref 5–15)
BUN: 17 mg/dL (ref 8–23)
CO2: 22 mmol/L (ref 22–32)
Calcium: 7.8 mg/dL — ABNORMAL LOW (ref 8.9–10.3)
Chloride: 104 mmol/L (ref 98–111)
Creatinine, Ser: 1.06 mg/dL (ref 0.61–1.24)
GFR, Estimated: >60 mL/min (ref >60)
Glucose, Bld: 92 mg/dL (ref 70–99)
Potassium: 4 mmol/L (ref 3.5–5.1)
Sodium: 137 mmol/L (ref 135–145)

## 2022-07-26 LAB — CBC
HCT: 37.2 % — ABNORMAL LOW (ref 39.0–52.0)
Hemoglobin: 12 g/dL — ABNORMAL LOW (ref 13.0–17.0)
MCH: 28.5 pg (ref 26.0–34.0)
MCHC: 32.3 g/dL (ref 30.0–36.0)
MCV: 88.4 fL (ref 80.0–100.0)
Platelets: 232 10*3/uL (ref 150–400)
RBC: 4.21 MIL/uL — ABNORMAL LOW (ref 4.22–5.81)
RDW: 14.1 % (ref 11.5–15.5)
WBC: 7.3 10*3/uL (ref 4.0–10.5)
nRBC: 0 % (ref 0.0–0.2)

## 2022-07-26 LAB — GLUCOSE, CAPILLARY
Glucose-Capillary: 74 mg/dL (ref 70–99)
Glucose-Capillary: 100 mg/dL — ABNORMAL HIGH (ref 70–99)
Glucose-Capillary: 130 mg/dL — ABNORMAL HIGH (ref 70–99)
Glucose-Capillary: 80 mg/dL (ref 70–99)

## 2022-07-26 LAB — PHOSPHORUS: Phosphorus: 3.3 mg/dL (ref 2.5–4.6)

## 2022-07-26 LAB — MAGNESIUM: Magnesium: 1.5 mg/dL — ABNORMAL LOW (ref 1.7–2.4)

## 2022-07-26 LAB — PROCALCITONIN: Procalcitonin: <0.1 ng/mL

## 2022-07-26 MED ORDER — OXYCODONE HCL 5 MG PO TABS
5.0000 mg | ORAL_TABLET | ORAL | Status: AC | PRN
  Administered 2022-07-27: 5 mg via ORAL
  Filled 2022-07-26: qty 1

## 2022-07-26 MED ORDER — MAGNESIUM SULFATE 2 GM/50ML IV SOLN
2.0000 g | Freq: Once | INTRAVENOUS | Status: AC
  Administered 2022-07-26: 2 g via INTRAVENOUS
  Filled 2022-07-26: qty 50

## 2022-07-26 MED ORDER — VANCOMYCIN HCL IN DEXTROSE 1-5 GM/200ML-% IV SOLN
1000.0000 mg | INTRAVENOUS | Status: DC
  Administered 2022-07-26: 1000 mg via INTRAVENOUS
  Filled 2022-07-26 (×2): qty 200

## 2022-07-26 NOTE — Consult Note (Addendum)
[place] Nurse Consult Note: Reason for Consult:Apply NPWT to left foot.  Known to our team from previous admission. New left lateral heel Deep tissue pressure injury and stage 2 pressure injury to sacrum Wound type:surgical, pressure, pressure plus moisture Pressure Injury POA: Yes Measurement:  Left Dorsal foot: oval full thickness wound measuring 4cm x 3cm with 90% of wound bed obscured by the presence of yellow slough and tendon. Dry, no exudate. Suture line with sutures distal to wound between digits. Left lateral heel: 3cm x 4cm deep purple blood filled blister. Sacral Stage 2:  2cm round x 0.1cm pink, moist, no exudate. Wound bed:As described above Drainage (amount, consistency, odor) As described above Periwound: intact Dressing procedure/placement/frequency:  Dorsal foot:  Dressing removed and wound cleansed with NS, dried. Periwound protected with skin barrier ring. Adaptic oil emulsion gauze applied over tendone and slough, one piece of black foam applied. Dressing is secured with drape and attached to 144mHg continuous negative pressure. An immediate seal is achieved. Patient tolerated procedure well with no pain medication. Foot is protected from medical device related pressure injury due to tubing with a Kerlix roll gauze wrap and this is topped with an ACE bandage. The foot is placed into a pressure redistribution heel boot, Prevalon. A Prevalon boot is also applied to the right foot for PI prevention.  Topical care guidance is provided for the Stage 2 PI to the sacrum and the new DTPI to the left lateral heel via the Nursing orders. A silicone foam will be placed to the sacrum, turning and repositioning is in place. The left lateral heel will be painted daily with a povidone-iodine swabstick and allowed to air dry. When dry, cover with dry gauze, secure with NPWT dressing support and place foot into Prevalon boot.  [facility] will follow for three times weekly NPWT dressing changes,  and will remain available to this patient, the nursing and medical teams.  Please re-consult if needed.  Thank you for inviting [place] participate in this patient's Plan of Care.  LMaudie Flakes MSN, RN, CNS, [place] CSerita Grammes [employer] FUnisys Corporationphone:  [phone]

## 2022-07-26 NOTE — Progress Notes (Signed)
PROGRESS NOTE    SY SCHLOTTERBECK   [id number] DOB: 31-Aug-1927  DOA: 07/25/2022 Date of Service: 07/26/22 PCP: Albina Billet, MD     Brief Narrative / Hospital Course:  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 02/20: temperature low down to 96.35F, BP soft but VS otherwise stable. Cr 1.29. CRP 1.7. Lactic and procal no concerns. UA no UTI. CT head nonacute, CXR Patchy retrocardiac consolidation may reflect atelectasis or airspace disease. Minimally displaced acute posterolateral left eighth rib fracture. XR foot negative. Tx w/ Vancomycin, cefepime per pharmacy. Podiatry consulted - wound appear stable, MRI planned to further evaluate however this cannot be done here d/t pacemaker, reordered wound vac.  02/21: Cr improved. Temp improved. Podiatry ok to monitor foot. Wound vac reapplied.     Consultants:  Podiatry   Procedures: none      ASSESSMENT & PLAN:   Principal Problem:   Altered mental status Active Problems:   History of MRSA infection   Insulin dependent type 2 diabetes mellitus (HCC)   Mixed hyperlipidemia   Benign essential HTN   Complete heart block (HCC)   Subdural hemorrhage (HCC)   Osteomyelitis (HCC)   PAD (peripheral artery disease) (HCC)   Hypothermia   Acute hypoxic respiratory failure (HCC)   AKI (acute kidney injury) ([place])   History of cardiac pacemaker   Pressure injury of skin   Left rib fracture  Altered mental status - improved  Ddx severe sepsis with multiorgan involvement, bacteremia, community-acquired pneumonia, hypoglycemia in setting of continued insulin use at facility with acute kidney injury Procalcitonin neg Continue with cefepime and vancomycin per pharmacy -->  d/c if negative BCx LR 2 L bolus and sodium chloride IVF at 150 mL/h   History of MRSA infection Continue contact precautions   Insulin dependent type 2 diabetes mellitus (HCC) Metformin 500 mg p.o. twice daily and Jardiance 10 mg daily not resumed on admission Insulin SSI with at bedtime coverage ordered   Benign essential HTN Home lisinopril and diltiazem not resumed on admission due to acute kidney injury and possible sepsis/SIRS admission Hydralazine 5 mg IV every 8 hours as needed for SBP greater 180, 4 days ordered   Left rib fracture Minimally displaced acute posterior lateral eighth rib fracture Continue monitoring and symptomatic support as needed   History of cardiac pacemaker Complete heart block ([place]) Status post dual-chamber pacemaker placement in August 2017 Not a candidate for MRI at [facility] - podiatry aware, appreciate recs --> ok to monitor foot for now, low suspicion for abscess    AKI (acute kidney injury) ([place]) - resolved No prior CKD Baseline serum creatinine has been 0.67-0.88/eGFR > 60 Likely secondary to poor p.o. intake however also concern for hypothermia, source of infection, acute hypoxic respiratory failure, intrarenal secondary to severe sepsis cannot be excluded at this time Monitor BMP   Acute hypoxic respiratory failure ([place]) - resolved Per ED note, On EMS arrival at [facility], patient was noted to have SpO2 saturation of 84% on room air.  Patient oxygen supplementation was improved after 2 L nasal cannula placed and head repositioning Procalcitonin no concerns  CXR no obvious PNA On room air today Incentive spirometry and flutter valve as appropriate and as tolerated Aspiration precaution   Hypothermia - resolved Patient  does not meet criteria for SIRS and/or sepsis with regards to vital signs or labs at this time However given altered mental status, hypothermia, acute hypoxic respiratory failure, and acute kidney injury all POA,  this is a change from prior hospitalization especially on 07/12/2022 Possible infectious source of left foot wound, currently without a wound VAC, severe sepsis/bacteremia with multiorgan involvement cannot be excluded at this time Blood cultures x 2 are in process vancomycin and cefepime per pharmacy but can deescalate if NG on BCx   Osteomyelitis ([place]) Left foot wound Podiatry has been consulted, we appreciate further recommendations Continue cefepime and vancomycin Blood cultures x 2 are collected and are in process   Subdural hemorrhage ([place]) History of subdural hematoma in August 2017 CT on admission: Read as decreasing size of chronic right subdural hematoma, no mass effect or midline shift        DVT prophylaxis: SCD Pertinent IV fluids/nutrition: stopped continuous IV fluids  Central lines / invasive devices: none  Code Status: DNR  Current Admission Status: inpatient   TOC needs / Dispo plan: back to SNF once stable Barriers to discharge / significant pending items: if foot reamins low/no pain and if blood cultures negative can discharge hopefully tomorrow              Subjective / Brief ROS:  Patient reports no concerns other than not being able to work his TV remote.  Denies CP/SOB.  Pain controlled.  Denies new weakness.  Tolerating diet.     Family Communication: TOC spoke w/ family today, I will also call to update later today.     Objective Findings:  Vitals:   07/25/22 2354 07/26/22 0439 07/26/22 0821 07/26/22 1231  BP: (!) 152/75 (!) 150/56 (!) 133/58 (!) 142/65  Pulse: 86 85 99 94  Resp: 20 16 16 18  $ Temp: 98.1 F (36.7 C) 97.8 F (36.6 C) 98.4 F (36.9 C) 97.6 F (36.4 C)  TempSrc:  Oral    SpO2: 97% 98% 97% 100%  Weight:        Intake/Output Summary (Last 24 hours) at 07/26/2022 1349 Last data filed at 07/26/2022 [id number] Gross per 24 hour  Intake 2031.46 ml  Output 1650 ml  Net 381.46 ml   Filed Weights   07/25/22 1034   Weight: 65.1 kg    Examination:  Physical Exam Constitutional:      General: He is not in acute distress.    Appearance: Normal appearance. He is not ill-appearing.  Cardiovascular:     Rate and Rhythm: Normal rate and regular rhythm.  Pulmonary:     Effort: Pulmonary effort is normal. No respiratory distress.     Breath sounds: Normal breath sounds.  Abdominal:     General: Abdomen is flat.     Palpations: Abdomen is soft.  Musculoskeletal:     Comments: Wound vac in place L foot interdigital space site of amputation 3rd toe, surrounding skin appears normal   Skin:    General: Skin is warm and dry.  Neurological:     General: No focal deficit present.     Mental Status: He is alert. Mental status is at baseline. He is disoriented.          Scheduled Medications:   apixaban  2.5 mg Oral BID   atorvastatin  40 mg Oral QPM   insulin aspart  0-5 Units Subcutaneous QHS   insulin aspart  0-9 Units Subcutaneous TID WC   magnesium oxide  400 mg  Oral Daily   mouth rinse  15 mL Mouth Rinse 4 times per day    Continuous Infusions:  ceFEPime (MAXIPIME) IV 2 g (07/26/22 1251)   magnesium sulfate bolus IVPB     vancomycin      PRN Medications:  acetaminophen **OR** acetaminophen, hydrALAZINE, ondansetron **OR** ondansetron (ZOFRAN) IV, mouth rinse, oxyCODONE, senna-docusate  Antimicrobials from admission:  Anti-infectives (From admission, onward)    Start     Dose/Rate Route Frequency Ordered Stop   07/26/22 1400  vancomycin (VANCOREADY) IVPB 750 mg/150 mL  Status:  Discontinued        750 mg 150 mL/hr over 60 Minutes Intravenous Every 24 hours 07/25/22 1619 07/26/22 1054   07/26/22 1400  vancomycin (VANCOCIN) IVPB 1000 mg/200 mL premix        1,000 mg 200 mL/hr over 60 Minutes Intravenous Every 24 hours 07/26/22 1054     07/26/22 0000  ceFEPIme (MAXIPIME) 2 g in sodium chloride 0.9 % 100 mL IVPB        2 g 200 mL/hr over 30 Minutes Intravenous Every 12 hours  07/25/22 1619     07/25/22 1130  vancomycin (VANCOREADY) IVPB 1250 mg/250 mL        1,250 mg 166.7 mL/hr over 90 Minutes Intravenous  Once 07/25/22 1055 07/25/22 1528   07/25/22 1100  ceFEPIme (MAXIPIME) 2 g in sodium chloride 0.9 % 100 mL IVPB        2 g 200 mL/hr over 30 Minutes Intravenous  Once 07/25/22 1055 07/25/22 1200           Data Reviewed:  I have personally reviewed the following...  CBC: Recent Labs  Lab 07/20/22 0540 07/21/22 0436 07/25/22 1050 07/26/22 0338  WBC 6.7 6.0 10.0 7.3  NEUTROABS  --   --  7.1  --   HGB 11.9* 12.6* 13.3 12.0*  HCT 36.3* 37.8* 41.0 37.2*  MCV 87.7 87.3 88.9 88.4  PLT 203 213 319 [id number]   Basic Metabolic Panel: Recent Labs  Lab 07/20/22 0540 07/21/22 0436 07/25/22 1050 07/26/22 0338  NA 138 133* 135 137  K 3.3* 3.3* 3.8 4.0  CL 106 100 100 104  CO2 26 26 23 22  $ GLUCOSE 172* 145* 91 92  BUN 10 8 20 17  $ CREATININE 0.67 0.74 1.29* 1.06  CALCIUM 6.9* 7.0* 8.3* 7.8*  MG  --  1.4*  --  1.5*  PHOS  --  2.3*  --  3.3   GFR: Estimated Creatinine Clearance: 40.9 mL/min (by C-G formula based on SCr of 1.06 mg/dL). Liver Function Tests: Recent Labs  Lab 07/25/22 1050  AST 42*  ALT 27  ALKPHOS 173*  BILITOT 1.3*  PROT 5.3*  ALBUMIN 2.4*   No results for input(s): "LIPASE", "AMYLASE" in the last 168 hours. No results for input(s): "AMMONIA" in the last 168 hours. Coagulation Profile: No results for input(s): "INR", "PROTIME" in the last 168 hours. Cardiac Enzymes: No results for input(s): "CKTOTAL", "CKMB", "CKMBINDEX", "TROPONINI" in the last 168 hours. BNP (last 3 results) No results for input(s): "PROBNP" in the last 8760 hours. HbA1C: No results for input(s): "HGBA1C" in the last 72 hours. CBG: Recent Labs  Lab 07/21/22 1151 07/25/22 1808 07/25/22 2028 07/26/22 0852 07/26/22 1231  GLUCAP 228* 99 97 74 100*   Lipid Profile: No results for input(s): "CHOL", "HDL", "LDLCALC", "TRIG", "CHOLHDL", "LDLDIRECT" in  the last 72 hours. Thyroid Function Tests: No results for input(s): "TSH", "T4TOTAL", "FREET4", "T3FREE", "THYROIDAB" in the last  72 hours. Anemia Panel: No results for input(s): "VITAMINB12", "FOLATE", "FERRITIN", "TIBC", "IRON", "RETICCTPCT" in the last 72 hours. Most Recent Urinalysis On File:     Component Value Date/Time   COLORURINE YELLOW (A) 07/25/2022 1212   APPEARANCEUR CLEAR (A) 07/25/2022 1212   APPEARANCEUR Clear 01/22/2013 1826   LABSPEC 1.017 07/25/2022 1212   LABSPEC 1.005 01/22/2013 1826   PHURINE 5.0 07/25/2022 1212   GLUCOSEU >=500 (A) 07/25/2022 1212   GLUCOSEU 50 mg/dL 01/22/2013 1826   HGBUR MODERATE (A) 07/25/2022 1212   BILIRUBINUR NEGATIVE 07/25/2022 1212   BILIRUBINUR Negative 01/22/2013 1826   KETONESUR 20 (A) 07/25/2022 1212   PROTEINUR 30 (A) 07/25/2022 1212   NITRITE NEGATIVE 07/25/2022 1212   LEUKOCYTESUR NEGATIVE 07/25/2022 1212   LEUKOCYTESUR Negative 01/22/2013 1826   Sepsis Labs: @LABRCNTIP$ (procalcitonin:4,lacticidven:4) Microbiology: No results found for this or any previous visit (from the past 240 hour(s)).    Radiology Studies last 3 days: DG Chest Port 1 View  Result Date: 07/25/2022 CLINICAL DATA:  Altered level of consciousness, sirs EXAM: PORTABLE CHEST 1 VIEW COMPARISON:  01/28/2016 FINDINGS: Single frontal view of the chest demonstrates dual lead pacer overlying left chest, proximal lead overlying right atrium and distal lead overlying right ventricle. Cardiac silhouette is unremarkable. Atherosclerosis of the aortic arch. There is patchy consolidation in the retrocardiac region which could reflect atelectasis or airspace disease. No effusion or pneumothorax. Minimally displaced left posterolateral eighth rib fracture. IMPRESSION: 1. Patchy retrocardiac consolidation may reflect atelectasis or airspace disease. 2. Minimally displaced acute posterolateral left eighth rib fracture. Electronically Signed   By: Randa Ngo M.D.   On:  07/25/2022 15:25   DG Foot Complete Left  Result Date: 07/25/2022 CLINICAL DATA:  Left foot pain, wound EXAM: LEFT FOOT - COMPLETE 3+ VIEW COMPARISON:  07/13/2022 FINDINGS: Interval amputation of the second toe at the MTP joint. Previous amputation of the third toe. Diffuse bony demineralization. Flexion of the remaining fourth and fifth toes causes bony superimposition and reduced sensitivity of assessment of these toes. Subtle periosteal reaction along the shafts of the second, third, fourth metatarsals best appreciated on the oblique projection. No bony destructive findings. Hazy lucency along the presume soft tissue flap distal to the amputation sites could represent wound or small amount of gas in the soft tissues. There is also a suggestion of a dorsal soft tissue defect along the distal forefoot on the lateral projection possibly from ulceration/wound Os peroneus noted. Plantar calcaneal spur. Vascular calcifications noted. IMPRESSION: 1. Interval amputation of the second toe at the MTP joint. Previous amputation of the third toe. 2. Subtle periosteal reaction along the shafts of the second, third, and fourth metatarsals, but no bony destructive findings specific for active osteomyelitis. 3. Hazy lucency along the presume soft tissue flap distal to the amputation sites could represent wound or small amount of gas in the soft tissues. There is also a dorsal soft tissue defect along the distal forefoot on the lateral projection possibly from ulceration/wound. Electronically Signed   By: Van Clines M.D.   On: 07/25/2022 12:29   CT HEAD WO CONTRAST (5MM)  Result Date: 07/25/2022 CLINICAL DATA:  Altered level of consciousness, headache EXAM: CT HEAD WITHOUT CONTRAST TECHNIQUE: Contiguous axial images were obtained from the base of the skull through the vertex without intravenous contrast. RADIATION DOSE REDUCTION: This exam was performed according to the departmental dose-optimization program which  includes automated exposure control, adjustment of the mA and/or kV according to patient size and/or use of  iterative reconstruction technique. COMPARISON:  01/28/2016 FINDINGS: Brain: There is atrophy and chronic small vessel disease changes. Chronic low-density right subdural hematoma noted measuring 4 mm in thickness compared with 13 mm previously. No mass effect or midline shift. No acute hemorrhage or hydrocephalus. Vascular: No hyperdense vessel or unexpected calcification. Skull: No acute calvarial abnormality. Sinuses/Orbits: No acute findings Other: None IMPRESSION: Decreasing size of the chronic right subdural hematoma, 4 mm currently compared to 13 mm previously. No mass effect or midline shift. Atrophy, chronic microvascular disease. No acute intracranial abnormality. Electronically Signed   By: Rolm Baptise M.D.   On: 07/25/2022 11:24             LOS: 1 day       Emeterio Reeve, DO [facility] 07/26/2022, 1:49 PM    Dictation software may have been used to generate the above note. Typos may occur and escape review in typed/dictated notes. Please contact Dr Sheppard Coil directly for clarity if needed.  Staff may message me via secure chat in [place]  but this may not receive an immediate response,  please page me for urgent matters!  If 7PM-7AM, please contact night coverage [email or web address]

## 2022-07-26 NOTE — Evaluation (Signed)
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 Recent admission for recent L foot surgery. Pt able to roll to R side and was repositioned in bed secondary to L weakness. Pt demonstrates deficits with strength/mobility/cognition. Would benefit from skilled PT to address above deficits and promote optimal return to PLOF; recommend transition to STR upon discharge from acute hospitalization.        Recommendations for follow up therapy are one component of a multi-disciplinary discharge planning process, led by the attending physician.  Recommendations may be updated based on patient status, additional functional criteria and insurance authorization.  Follow Up Recommendations Skilled nursing-short term rehab (<3 hours/day) Can patient physically be transported by private vehicle: No    Assistance Recommended at Discharge Frequent or constant Supervision/Assistance  Patient can return home with the following  A lot of help with bathing/dressing/bathroom;Assistance with cooking/housework;Direct supervision/assist for medications management;Assist for transportation;Help with stairs or ramp for entrance;Direct supervision/assist for financial management;Two people to help with walking and/or transfers    Equipment Recommendations  (TBD)  Recommendations for Other Services       Functional Status Assessment Patient has had a recent decline in their functional status and/or demonstrates limited  ability to make significant improvements in function in a reasonable and predictable amount of time     Precautions / Restrictions Precautions Precautions: Fall Restrictions Weight Bearing Restrictions: Yes LLE Weight Bearing: Non weight bearing Other Position/Activity Restrictions: per Dr Vickki Muff 07/26/22      Mobility  Bed Mobility Overal bed mobility: Needs Assistance             General bed mobility comments: assisted with repositioning with pillows and to elevate L UE secondary to weakness.    Transfers                   General transfer comment: not able    Ambulation/Gait                  Stairs            Wheelchair Mobility    Modified Rankin (Stroke Patients Only)       Balance                                             Pertinent Vitals/Pain Pain Assessment Pain Assessment: No/denies pain    Home Living Family/patient expects to be discharged to:: Private residence Living Arrangements: Alone Available Help at Discharge: Family;Friend(s);Available PRN/intermittently Type of Home: Apartment Home Access: Level entry       Home Layout: One level Home Equipment: Rollator (4 wheels);Rolling Walker (2 wheels) Additional Comments: information gained from previous admission due to pt cognition- pt is recently at rehab    Prior Function Prior Level of Function : Independent/Modified Independent;Needs assist             Mobility Comments: Pt has recently been at SNF. Unsure of mobility level while admitted,  however per chart review was previously indep and active. Pt is poor historian ADLs Comments: pt recently discharged to rehab, required assist for all ADL/IADL     Hand Dominance        Extremity/Trunk Assessment   Upper Extremity Assessment Upper Extremity Assessment: LUE deficits/detail LUE Deficits / Details: pt slightly weaker grossly on the L- 3+/5 throughout    Lower Extremity  Assessment Lower Extremity Assessment: Generalized weakness       Communication   Communication: No difficulties  Cognition Arousal/Alertness: Awake/alert Behavior During Therapy: WFL for tasks assessed/performed Overall Cognitive Status: Impaired/Different from baseline                                 General Comments: alert to patient identifiers and year. Confused to situation and poor attention to task.        General Comments      Exercises Other Exercises Other Exercises: supine ther-ex performed on B LE including SLRs, heel slides, and hip abd/add. 10 reps with min assist   Assessment/Plan    PT Assessment Patient needs continued PT services  PT Problem List Decreased strength;Decreased activity tolerance;Decreased balance;Decreased mobility;Decreased knowledge of use of DME;Decreased safety awareness;Decreased knowledge of precautions       PT Treatment Interventions DME instruction;Gait training;Functional mobility training;Therapeutic activities;Therapeutic exercise;Balance training;Patient/family education    PT Goals (Current goals can be found in the Care Plan section)  Acute Rehab PT Goals Patient Stated Goal: To get stronger PT Goal Formulation: With patient Time For Goal Achievement: 08/09/22 Potential to Achieve Goals: Fair    Frequency Min 2X/week     Co-evaluation               AM-PAC PT "6 Clicks" Mobility  Outcome Measure Help needed turning from your back to your side while in a flat bed without using bedrails?: Total Help needed moving from lying on your back to sitting on the side of a flat bed without using bedrails?: Total Help needed moving to and from a bed to a chair (including a wheelchair)?: Total Help needed standing up from a chair using your arms (e.g., wheelchair or bedside chair)?: Total Help needed to walk in hospital room?: Total Help needed climbing 3-5 steps with a railing? : Total 6 Click Score: 6     End of Session   Activity Tolerance: Patient limited by pain Patient left: in bed;with bed alarm set;with family/visitor present Nurse Communication: Mobility status;Weight bearing status PT Visit Diagnosis: Muscle weakness (generalized) (M62.81);Difficulty in walking, not elsewhere classified (R26.2)    Time: [id number] PT Time Calculation (min) (ACUTE ONLY): 14 min   Charges:   PT Evaluation $PT Eval Moderate Complexity: 1 [address], PT, DPT, GCS [phone]   Venola Castello 07/26/2022, 4:16 PM

## 2022-07-26 NOTE — NC FL2 (Signed)
[place] LEVEL OF CARE FORM     IDENTIFICATION  Patient Name: Charles Jennings Birthdate: [birth year] Sex: male Admission Date (Current Location): 07/25/2022  [place] and [place] Number:  Engineering geologist and Address:  [facility], [address] [place],  [address]      Provider Number: [id number]  Attending Physician Name and Address:  Emeterio Reeve, DO  Relative Name and Phone Number:       Current Level of Care: Hospital Recommended Level of Care: [place] Prior Approval Number:    Date Approved/Denied:   PASRR Number: [id number]  Discharge Plan: SNF    Current Diagnoses: Patient Active Problem List   Diagnosis Date Noted   Altered mental status 07/25/2022   Hypothermia 07/25/2022   Acute hypoxic respiratory failure ([place]) 07/25/2022   AKI (acute kidney injury) ([place]) 07/25/2022   History of cardiac pacemaker 07/25/2022   Pressure injury of skin 07/25/2022   Left rib fracture 07/25/2022   History of MRSA infection 07/25/2022   PAD (peripheral artery disease) ([place]) 07/20/2022   Paranoia ([place]) 07/17/2022   Gangrene of toe ([place]) 07/15/2022   Osteomyelitis ([place]) 07/12/2022   Stage 3a chronic kidney disease (CKD) ([place]) 07/12/2022   Weakness 07/12/2022   Critical limb ischemia of left lower extremity with ulceration of foot ([place]) 06/15/2022   Nausea and vomiting 06/13/2022   Critical limb ischemia of left lower extremity ([place]) 06/12/2022   Limb ischemia 06/11/2022   CKD (chronic kidney disease) 06/11/2022   Chronic diastolic CHF (congestive heart failure), NYHA class 2 ([place]) 03/07/2018   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/2016   Benign essential HTN 02/11/2016   Complete heart block ([place]) 02/11/2016   Mixed hyperlipidemia 02/11/2016   Insulin dependent type 2 diabetes mellitus ([place]) 01/28/2016   Subdural hemorrhage ([place]) 01/28/2016    Orientation RESPIRATION BLADDER Height & Weight      (Disoriented x  4)  Normal Incontinent, External catheter Weight: 143 lb 8.3 oz (65.1 kg) Height:     BEHAVIORAL SYMPTOMS/MOOD NEUROLOGICAL BOWEL NUTRITION STATUS   (None)  (None) Incontinent Diet  AMBULATORY STATUS COMMUNICATION OF NEEDS Skin   Extensive Assist Verbally Bruising, Skin abrasions, Other (Comment), PU Stage and Appropriate Care, Wound Vac (Erythema/redness, weeping. Non-pressure wound on left foot: wound vac per WOC note. Deep tissue wound on left heel: Gauze prn.) PU Stage 1 Dressing:  (Right buttocks: Foam prn)                     Personal Care Assistance Level of Assistance  Bathing, Feeding, Dressing Bathing Assistance: Maximum assistance Feeding assistance: Maximum assistance Dressing Assistance: Maximum assistance     Functional Limitations Info  Sight, Hearing, Speech Sight Info: Adequate Hearing Info: Adequate Speech Info: Impaired (Slurred/dysarthria)    SPECIAL CARE FACTORS FREQUENCY  PT (By licensed PT), OT (By licensed OT)     PT Frequency: 5 x week OT Frequency: 5 x week            Contractures Contractures Info: Not present    Additional Factors Info  Code Status, Allergies, Isolation Precautions Code Status Info: DNR Allergies Info: NKDA     Isolation Precautions Info: Contact: MRSA     Current Medications (07/26/2022):  This is the current hospital active medication list Current Facility-Administered Medications  Medication Dose Route Frequency Provider Last Rate Last Admin   acetaminophen (TYLENOL) tablet 650 mg  650 mg Oral Q6H PRN Cox, Amy N, DO  Or   acetaminophen (TYLENOL) suppository 650 mg  650 mg Rectal Q6H PRN Cox, Amy N, DO       apixaban (ELIQUIS) tablet 2.5 mg  2.5 mg Oral BID Cox, Amy N, DO   2.5 mg at 07/26/22 1005   atorvastatin (LIPITOR) tablet 40 mg  40 mg Oral QPM Cox, Amy N, DO       ceFEPIme (MAXIPIME) 2 g in sodium chloride 0.9 % 100 mL IVPB  2 g Intravenous Q12H Cox, Amy N, DO 200 mL/hr at 07/26/22 1251 2 g at 07/26/22  1251   hydrALAZINE (APRESOLINE) injection 5 mg  5 mg Intravenous Q8H PRN Cox, Amy N, DO       insulin aspart (novoLOG) injection 0-5 Units  0-5 Units Subcutaneous QHS Cox, Amy N, DO       insulin aspart (novoLOG) injection 0-9 Units  0-9 Units Subcutaneous TID WC Cox, Amy N, DO       magnesium oxide (MAG-OX) tablet 400 mg  400 mg Oral Daily Cox, Amy N, DO   400 mg at 07/26/22 1005   ondansetron (ZOFRAN) tablet 4 mg  4 mg Oral Q6H PRN Cox, Amy N, DO       Or   ondansetron (ZOFRAN) injection 4 mg  4 mg Intravenous Q6H PRN Cox, Amy N, DO       Oral care mouth rinse  15 mL Mouth Rinse 4 times per day Cox, Amy N, DO   15 mL at 07/26/22 1526   Oral care mouth rinse  15 mL Mouth Rinse PRN Cox, Amy N, DO       oxyCODONE (Oxy IR/ROXICODONE) immediate release tablet 5 mg  5 mg Oral Q4H PRN Emeterio Reeve, DO       senna-docusate (Senokot-S) tablet 1 tablet  1 tablet Oral QHS PRN Cox, Amy N, DO       vancomycin (VANCOCIN) IVPB 1000 mg/200 mL premix  1,000 mg Intravenous Q24H Oswald Hillock, RPH 200 mL/hr at 07/26/22 1350 1,000 mg at 07/26/22 1350     Discharge Medications: Please see discharge summary for a list of discharge medications.  Relevant Imaging Results:  Relevant Lab Results:   Additional Information SS#: [id number]  Candie Chroman, LCSW

## 2022-07-26 NOTE — Evaluation (Addendum)
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 Pt is oriented to self and place, confabulatory throughout and multi modal cues required for all ADL tasks. Pt is able to report general reason for admission, that he was at rehab prior to admission. PTA pt is requiring assist for all ADL/IADL at rehab, prior to that, pt was generally MOD I in ADL. Pt presents with deficits in strength, endurance, activity tolerance, balance all affecting safe and optimal ADL completion. Recommend return to STR at this time to address functional deficits. Pt is left in bed, B prevlon boots donned, sacrum off loaded with wedge to the L. NT notified of pt status. OT will continue to follow acutely.      Recommendations for follow up therapy are one component of a multi-disciplinary discharge planning process, led by the attending physician.  Recommendations may be updated based on patient status, additional functional criteria and insurance authorization.   Follow Up Recommendations  Skilled nursing-short term rehab (<3 hours/day)     Assistance Recommended at Discharge Frequent or constant Supervision/Assistance  Patient can return home with the following A lot of help with walking and/or transfers;A lot of help with bathing/dressing/bathroom    Functional Status Assessment  Patient has had a recent decline in their functional status and demonstrates the ability to make significant improvements in function in  a reasonable and predictable amount of time.  Equipment Recommendations  Other (comment) (per next venue of care)    Recommendations for Other Services       Precautions / Restrictions Precautions Precautions: Fall Restrictions Weight Bearing Restrictions: Yes LLE Weight Bearing: Non weight bearing Other Position/Activity Restrictions: per Dr Vickki Muff 07/26/22      Mobility Bed Mobility Overal bed mobility: Needs Assistance Bed Mobility: Sit to Supine, Rolling, Supine to Sit Rolling: Mod assist   Supine to sit: Max assist, HOB elevated Sit to supine: Max assist   General bed mobility comments: step by step multi modal cueing    Transfers Overall transfer level: Needs assistance Equipment used: Rolling walker (2 wheels) Transfers: Sit to/from Stand Sit to Stand: Max assist, From elevated surface                  Balance Overall balance assessment: Needs assistance Sitting-balance support: Feet supported Sitting balance-Leahy Scale: Poor   Postural control: Left lateral lean Standing balance support: Bilateral upper extremity supported, Reliant on assistive device for balance Standing balance-Leahy Scale: Poor                             ADL either performed or assessed with clinical judgement   ADL Overall ADL's : Needs assistance/impaired                 Upper Body Dressing : Moderate assistance   Lower Body Dressing: Maximal assistance Lower Body Dressing Details (indicate cue type and reason): socks     Toileting- Clothing Manipulation and Hygiene: Maximal assistance;Bed level  Vision Patient Visual Report: No change from baseline       Perception     Praxis      Pertinent Vitals/Pain Pain Assessment Pain Assessment: Faces Faces Pain Scale: Hurts little more Pain Location: generalized Pain Descriptors / Indicators: Aching, Sore Pain Intervention(s): Repositioned, Limited activity within patient's  tolerance, Monitored during session     Hand Dominance     Extremity/Trunk Assessment Upper Extremity Assessment Upper Extremity Assessment: LUE deficits/detail LUE Deficits / Details: pt slightly weaker grossly on the L- 3+/5 throughout   Lower Extremity Assessment Lower Extremity Assessment: Generalized weakness       Communication Communication Communication: No difficulties   Cognition Arousal/Alertness: Awake/alert Behavior During Therapy: WFL for tasks assessed/performed, Flat affect Overall Cognitive Status: No family/caregiver present to determine baseline cognitive functioning Area of Impairment: Safety/judgement, Awareness, Attention, Following commands, Orientation, Memory, Problem solving                 Orientation Level: Disoriented to, Time, Situation Current Attention Level: Sustained Memory: Decreased short-term memory, Decreased recall of precautions Following Commands: Follows one step commands consistently, Follows one step commands with increased time Safety/Judgement: Decreased awareness of safety Awareness: Emergent Problem Solving: Slow processing, Requires verbal cues, Requires tactile cues, Difficulty sequencing       General Comments       Exercises     Shoulder Instructions      Home Living Family/patient expects to be discharged to:: Private residence Living Arrangements: Alone Available Help at Discharge: Family;Friend(s);Available PRN/intermittently Type of Home: Apartment Home Access: Level entry     Home Layout: One level     Bathroom Shower/Tub: Teacher, early years/pre: Handicapped height     Home Equipment: Rollator (4 wheels);Rolling Walker (2 wheels)   Additional Comments: information gained from previous admission due to pt cognition- pt is recently at rehab      Prior Functioning/Environment Prior Level of Function : Independent/Modified Independent;Needs assist             Mobility Comments:  Assistance for all mobility at rehab, prior to previous admission pt was amb with AD ADLs Comments: pt recently discharged to rehab, required assist for all ADL/IADL        OT Problem List: Decreased strength;Impaired balance (sitting and/or standing);Decreased activity tolerance;Decreased safety awareness;Decreased knowledge of precautions;Decreased knowledge of use of DME or AE      OT Treatment/Interventions: Self-care/ADL training;Patient/family education;Therapeutic exercise;Balance training;Energy conservation;Therapeutic activities;DME and/or AE instruction    OT Goals(Current goals can be found in the care plan section) Acute Rehab OT Goals Patient Stated Goal: return to rehab OT Goal Formulation: With patient Time For Goal Achievement: 08/08/22 Potential to Achieve Goals: Good ADL Goals Pt Will Perform Grooming: sitting;with min guard assist Pt Will Perform Lower Body Dressing: with min guard assist Pt Will Transfer to Toilet: with min guard assist Pt Will Perform Toileting - Clothing Manipulation and hygiene: with min guard assist  OT Frequency: Min 2X/week    Co-evaluation              AM-PAC OT "6 Clicks" Daily Activity     Outcome Measure Help from another person eating meals?: A Little Help from another person taking care of personal grooming?: A Little Help from another person toileting, which includes using toliet, bedpan, or urinal?: A Lot Help from another person bathing (including washing, rinsing, drying)?: A Lot Help from another person to put on and taking off regular upper body clothing?: A  Little Help from another person to put on and taking off regular lower body clothing?: A Lot 6 Click Score: 15   End of Session Equipment Utilized During Treatment: Rolling walker (2 wheels) Nurse Communication: Mobility status (LUE weakness)  Activity Tolerance: Patient tolerated treatment well Patient left: in bed;with call bell/phone within reach;with bed alarm  set  OT Visit Diagnosis: Unsteadiness on feet (R26.81);Other abnormalities of gait and mobility (R26.89);History of falling (Z91.81);Muscle weakness (generalized) (M62.81)                Time: [id number] OT Time Calculation (min): 23 min Charges:  OT General Charges $OT Visit: 1 Visit OT Evaluation $OT Eval Moderate Complexity: 1 Mod  Shanon Payor, OTD OTR/L  07/26/22, 4:26 PM

## 2022-07-26 NOTE — TOC Initial Note (Signed)
Transition of Care [facility]) - Initial/Assessment Note    Patient Details  Name: Charles Jennings MRN: [id number] Date of Birth: 1928-02-22  Transition of Care [facility]) CM/SW Contact:    Candie Chroman, LCSW Phone Number: 07/26/2022, 10:38 AM  Clinical Narrative: Patient is not oriented. CSW called patient's daughter, introduced role, and explained that discharge planning would be discussed. Daughter confirmed he was admitted from [place] where he was getting rehab. Patient discharged to this facility on 2/16 and was readmitted yesterday. Attending will consult PT and OT when appropriate. Daughter is hopeful that patient will improve and be able to return to SNF to resume rehab. She also stated that he has not been eating or drinking and would not want a feeding tube. Daughter stated she is aware hospice may have to become involved. Attending is aware. CSW will follow progress to determine plan. [facility] admissions coordinator did confirm they can accept him back once stable. No further concerns. CSW encouraged patient's daughter to contact CSW as needed. CSW will continue to follow patient and his daughter for support and facilitate discharge once plan determined.                 Expected Discharge Plan: Skilled Nursing Facility Barriers to Discharge: Continued Medical Work up   Patient Goals and CMS Choice     Choice offered to / list presented to : NA      Expected Discharge Plan and Services     Post Acute Care Choice: [place] Living arrangements for the past 2 months: Apartment                                      Prior Living Arrangements/Services Living arrangements for the past 2 months: Apartment Lives with:: Self Patient language and need for interpreter reviewed:: Yes Do you feel safe going back to the place where you live?: Yes      Need for Family Participation in Patient Care: Yes (Comment) Care giver support system in  place?: Yes (comment)   Criminal Activity/Legal Involvement Pertinent to Current Situation/Hospitalization: No - Comment as needed  Activities of Daily Living   ADL Screening (condition at time of admission) Patient's cognitive ability adequate to safely complete daily activities?: No Is the patient deaf or have difficulty hearing?: No Does the patient have difficulty seeing, even when wearing glasses/contacts?: No Does the patient have difficulty concentrating, remembering, or making decisions?: Yes Patient able to express need for assistance with ADLs?: No Does the patient have difficulty dressing or bathing?: Yes Independently performs ADLs?: No Communication: Needs assistance Is this a change from baseline?: Pre-admission baseline Dressing (OT): Dependent Is this a change from baseline?: Pre-admission baseline Grooming: Dependent Is this a change from baseline?: Pre-admission baseline Feeding: Needs assistance Is this a change from baseline?: Pre-admission baseline Bathing: Dependent Is this a change from baseline?: Pre-admission baseline Toileting: Dependent Is this a change from baseline?: Pre-admission baseline In/Out Bed: Dependent Is this a change from baseline?: Pre-admission baseline Walks in Home: Dependent Is this a change from baseline?: Pre-admission baseline Does the patient have difficulty walking or climbing stairs?: Yes Weakness of Legs: Both Weakness of Arms/Hands: Both  Permission Sought/Granted Permission sought to share information with : Facility Sport and exercise psychologist, Family Supports    Share Information with NAME: Laural Benes  Permission granted to share info w AGENCY: [facility]  Permission  granted to share info w Relationship: Daughter/HCPOA  Permission granted to share info w Contact Information: [phone]  Emotional Assessment   Attitude/Demeanor/Rapport: Unable to Assess Affect (typically observed): Unable to  Assess Orientation: :  (Disoriented x 4) Alcohol / Substance Use: Not Applicable Psych Involvement: No (comment)  Admission diagnosis:  Altered mental status [R41.82] Patient Active Problem List   Diagnosis Date Noted   Altered mental status 07/25/2022   Hypothermia 07/25/2022   Acute hypoxic respiratory failure ([place]) 07/25/2022   AKI (acute kidney injury) ([place]) 07/25/2022   History of cardiac pacemaker 07/25/2022   Pressure injury of skin 07/25/2022   Left rib fracture 07/25/2022   History of MRSA infection 07/25/2022   PAD (peripheral artery disease) ([place]) 07/20/2022   Paranoia ([place]) 07/17/2022   Gangrene of toe ([place]) 07/15/2022   Osteomyelitis ([place]) 07/12/2022   Stage 3a chronic kidney disease (CKD) ([place]) 07/12/2022   Weakness 07/12/2022   Critical limb ischemia of left lower extremity with ulceration of foot ([place]) 06/15/2022   Nausea and vomiting 06/13/2022   Critical limb ischemia of left lower extremity ([place]) 06/12/2022   Limb ischemia 06/11/2022   CKD (chronic kidney disease) 06/11/2022   Chronic diastolic CHF (congestive heart failure), NYHA class 2 ([place]) 03/07/2018   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/2016   Benign essential HTN 02/11/2016   Complete heart block ([place]) 02/11/2016   Mixed hyperlipidemia 02/11/2016   Insulin dependent type 2 diabetes mellitus ([place]) 01/28/2016   Subdural hemorrhage ([place]) 01/28/2016   PCP:  Albina Billet, MD Pharmacy:   [facility] [address] [place] [address] Phone: [phone] Fax: [phone]  [id number] - [place], [place] [address] [place] [address] Phone: [phone] Fax: [phone]     Social Determinants of Health (SDOH) Social History: SDOH Screenings   Food Insecurity: No Food Insecurity (07/12/2022)  Housing: Low Risk  (07/12/2022)  Transportation Needs: No Transportation Needs (07/12/2022)  Utilities:  Not At Risk (07/12/2022)  Tobacco Use: Low Risk  (07/25/2022)  Recent Concern: Tobacco Use - Medium Risk (07/12/2022)   SDOH Interventions:     Readmission Risk Interventions     No data to display

## 2022-07-26 NOTE — Plan of Care (Signed)
Problem: Coping: Goal: Ability to adjust to condition or change in health will improve Outcome: Progressing   Problem: Fluid Volume: Goal: Ability to maintain a balanced intake and output will improve Outcome: Progressing   Problem: Metabolic: Goal: Ability to maintain appropriate glucose levels will improve Outcome: Progressing   Problem: Nutritional: Goal: Maintenance of adequate nutrition will improve Outcome: Progressing Goal: Progress toward achieving an optimal weight will improve Outcome: Progressing   Problem: Skin Integrity: Goal: Risk for impaired skin integrity will decrease Outcome: Progressing   Problem: Tissue Perfusion: Goal: Adequacy of tissue perfusion will improve Outcome: Progressing   Problem: Clinical Measurements: Goal: Ability to maintain clinical measurements within normal limits will improve Outcome: Progressing Goal: Will remain free from infection Outcome: Progressing Goal: Diagnostic test results will improve Outcome: Progressing Goal: Respiratory complications will improve Outcome: Progressing Goal: Cardiovascular complication will be avoided Outcome: Progressing   Problem: Activity: Goal: Risk for activity intolerance will decrease Outcome: Progressing   Problem: Nutrition: Goal: Adequate nutrition will be maintained Outcome: Progressing   Problem: Coping: Goal: Level of anxiety will decrease Outcome: Progressing   Problem: Elimination: Goal: Will not experience complications related to bowel motility Outcome: Progressing Goal: Will not experience complications related to urinary retention Outcome: Progressing   Problem: Pain Managment: Goal: General experience of comfort will improve Outcome: Progressing   Problem: Safety: Goal: Ability to remain free from injury will improve Outcome: Progressing   Problem: Skin Integrity: Goal: Risk for impaired skin integrity will decrease Outcome: Progressing   Problem:  Education: Goal: Knowledge of General Education information will improve Description: Including pain rating scale, medication(s)/side effects and non-pharmacologic comfort measures Outcome: Progressing   Problem: Clinical Measurements: Goal: Ability to maintain clinical measurements within normal limits will improve Outcome: Progressing Goal: Will remain free from infection Outcome: Progressing Goal: Diagnostic test results will improve Outcome: Progressing Goal: Respiratory complications will improve Outcome: Progressing Goal: Cardiovascular complication will be avoided Outcome: Progressing   Problem: Activity: Goal: Risk for activity intolerance will decrease Outcome: Progressing   Problem: Nutrition: Goal: Adequate nutrition will be maintained Outcome: Progressing   Problem: Coping: Goal: Level of anxiety will decrease Outcome: Progressing   Problem: Elimination: Goal: Will not experience complications related to bowel motility Outcome: Progressing Goal: Will not experience complications related to urinary retention Outcome: Progressing   Problem: Pain Managment: Goal: General experience of comfort will improve Outcome: Progressing   Problem: Safety: Goal: Ability to remain free from injury will improve Outcome: Progressing   Problem: Skin Integrity: Goal: Risk for impaired skin integrity will decrease Outcome: Progressing   Problem: Education: Goal: Ability to describe self-care measures that may prevent or decrease complications (Diabetes Survival Skills Education) will improve Outcome: Not Progressing Note: Patient experiencing altered mental status and confusion at this time. Will continue to reorient and educate as needed. Goal: Individualized Educational Video(s) Outcome: Not Progressing Note: Patient experiencing altered mental status and confusion at this time. Will continue to reorient and educate as needed.   Problem: Health Behavior/Discharge  Planning: Goal: Ability to identify and utilize available resources and services will improve Outcome: Not Progressing Note: Patient experiencing altered mental status and confusion at this time. Will continue to reorient and educate as needed. Goal: Ability to manage health-related needs will improve Outcome: Not Progressing Note: Patient experiencing altered mental status and confusion at this time. Will continue to reorient and educate as needed.   Problem: Education: Goal: Knowledge of General Education information will improve Description: Including pain rating scale, medication(s)/side effects  and non-pharmacologic comfort measures Outcome: Not Progressing Note: Patient experiencing altered mental status and confusion at this time. Will continue to reorient and educate as needed.   Problem: Health Behavior/Discharge Planning: Goal: Ability to manage health-related needs will improve Outcome: Not Progressing Note: Patient experiencing altered mental status and confusion at this time. Will continue to reorient and educate as needed.   Problem: Health Behavior/Discharge Planning: Goal: Ability to manage health-related needs will improve Outcome: Not Progressing Note: Patient experiencing altered mental status and confusion at this time. Will continue to reorient and educate as needed.

## 2022-07-27 DIAGNOSIS — S2232XA Fracture of one rib, left side, initial encounter for closed fracture: Secondary | ICD-10-CM | POA: Diagnosis not present

## 2022-07-27 DIAGNOSIS — R410 Disorientation, unspecified: Secondary | ICD-10-CM

## 2022-07-27 DIAGNOSIS — T68XXXA Hypothermia, initial encounter: Secondary | ICD-10-CM | POA: Diagnosis not present

## 2022-07-27 DIAGNOSIS — M869 Osteomyelitis, unspecified: Secondary | ICD-10-CM | POA: Diagnosis not present

## 2022-07-27 LAB — BASIC METABOLIC PANEL
Anion gap: 10 (ref 5–15)
BUN: 17 mg/dL (ref 8–23)
CO2: 23 mmol/L (ref 22–32)
Calcium: 8 mg/dL — ABNORMAL LOW (ref 8.9–10.3)
Chloride: 103 mmol/L (ref 98–111)
Creatinine, Ser: 0.94 mg/dL (ref 0.61–1.24)
GFR, Estimated: >60 mL/min (ref >60)
Glucose, Bld: 62 mg/dL — ABNORMAL LOW (ref 70–99)
Potassium: 3.9 mmol/L (ref 3.5–5.1)
Sodium: 136 mmol/L (ref 135–145)

## 2022-07-27 LAB — CBC
HCT: 35.4 % — ABNORMAL LOW (ref 39.0–52.0)
Hemoglobin: 11.7 g/dL — ABNORMAL LOW (ref 13.0–17.0)
MCH: 29 pg (ref 26.0–34.0)
MCHC: 33.1 g/dL (ref 30.0–36.0)
MCV: 87.6 fL (ref 80.0–100.0)
Platelets: 226 10*3/uL (ref 150–400)
RBC: 4.04 MIL/uL — ABNORMAL LOW (ref 4.22–5.81)
RDW: 13.9 % (ref 11.5–15.5)
WBC: 8.5 10*3/uL (ref 4.0–10.5)
nRBC: 0 % (ref 0.0–0.2)

## 2022-07-27 LAB — PROCALCITONIN: Procalcitonin: <0.1 ng/mL

## 2022-07-27 LAB — GLUCOSE, CAPILLARY
Glucose-Capillary: 112 mg/dL — ABNORMAL HIGH (ref 70–99)
Glucose-Capillary: 58 mg/dL — ABNORMAL LOW (ref 70–99)
Glucose-Capillary: 83 mg/dL (ref 70–99)
Glucose-Capillary: 133 mg/dL — ABNORMAL HIGH (ref 70–99)
Glucose-Capillary: 118 mg/dL — ABNORMAL HIGH (ref 70–99)

## 2022-07-27 MED ORDER — LEVOFLOXACIN 500 MG PO TABS
750.0000 mg | ORAL_TABLET | Freq: Once | ORAL | Status: AC
  Administered 2022-07-27: 750 mg via ORAL
  Filled 2022-07-27: qty 2

## 2022-07-27 MED ORDER — DOXYCYCLINE HYCLATE 100 MG PO TABS
100.0000 mg | ORAL_TABLET | Freq: Once | ORAL | Status: AC
  Administered 2022-07-27: 100 mg via ORAL
  Filled 2022-07-27: qty 1

## 2022-07-27 MED ORDER — ASPIRIN 81 MG PO TBEC
81.0000 mg | DELAYED_RELEASE_TABLET | Freq: Once | ORAL | Status: AC
  Administered 2022-07-27: 81 mg via ORAL
  Filled 2022-07-27: qty 1

## 2022-07-27 MED ORDER — NOVOLOG MIX 70/30 FLEXPEN (70-30) 100 UNIT/ML ~~LOC~~ SUPN: 8.0000 [IU] | PEN_INJECTOR | Freq: Every day | SUBCUTANEOUS | 11 refills | Status: DC

## 2022-07-27 MED ORDER — DILTIAZEM HCL ER COATED BEADS 120 MG PO CP24: 120.0000 mg | ORAL_CAPSULE | Freq: Every day | ORAL | 0 refills | Status: DC

## 2022-07-27 NOTE — TOC Progression Note (Addendum)
Transition of Care [facility]) - Progression Note    Patient Details  Name: Charles Jennings MRN: [id number] Date of Birth: [birth year]  Transition [facility] Care [facility]) CM/SW Jack, LCSW Phone Number: 07/27/2022, 8:34 AM  Clinical Narrative:  Left voicemail for [facility] admissions coordinator to confirm patient can return today and that they still have his wound vac.   12:05 pm: Admissions confirmed they still have his wound vac this morning. Left message for admissions coordinator asking him to call with time to set up transport.  Expected Discharge Plan: [place] Barriers to Discharge: Continued Medical Work up  Expected Discharge Plan and Services     Post Acute Care Choice: [place] Living arrangements for the past 2 months: Apartment                                       Social Determinants of Health (SDOH) Interventions SDOH Screenings   Food Insecurity: No Food Insecurity (07/12/2022)  Housing: Low Risk  (07/12/2022)  Transportation Needs: No Transportation Needs (07/12/2022)  Utilities: Not At Risk (07/12/2022)  Tobacco Use: Low Risk  (07/25/2022)  Recent Concern: Tobacco Use - Medium Risk (07/12/2022)    Readmission Risk Interventions     No data to display

## 2022-07-27 NOTE — Care Management Important Message (Addendum)
Important Message  Patient Details  Name: Charles Jennings MRN: [id number] Date of Birth: July 16, 1927   Medicare Important Message Given: NA - LOS <3 / Initial given by admissions     Dannette Barbara 07/27/2022, 1:42 PM

## 2022-07-27 NOTE — TOC Transition Note (Signed)
Transition of Care [facility]) - CM/SW Discharge Note   Patient Details  Name: Charles Jennings MRN: [id number] Date of Birth: 07/19/1927  [facility]) CM/SW Contact:  Candie Chroman, LCSW Phone Number: 07/27/2022, 2:01 PM   Clinical Narrative:  Patient has orders to discharge back to [facility] today. RN will call report to [phone] (Room E10). EMS transport has been arranged and he is 3rd on the list. No further concerns. CSW signing off.   Final next level of care: Skilled Nursing Facility Barriers to Discharge: Barriers Resolved   Patient Goals and CMS Choice   Choice offered to / list presented to : Adult Children  Discharge Placement     Existing PASRR number confirmed : 07/26/22          Patient chooses bed at: Other - please specify in the comment section below: ([facility]) Patient to be transferred to facility by: EMS Name of family member notified: Laural Benes Patient and family notified of of transfer: 07/27/22  Discharge Plan and Services Additional resources added to the After Visit Summary for       Post Acute Care Choice: [place]                               Social Determinants of Health (SDOH) Interventions SDOH Screenings   Food Insecurity: No Food Insecurity (07/12/2022)  Housing: Low Risk  (07/12/2022)  Transportation Needs: No Transportation Needs (07/12/2022)  Utilities: Not At Risk (07/12/2022)  Tobacco Use: Low Risk  (07/25/2022)  Recent Concern: Tobacco Use - Medium Risk (07/12/2022)     Readmission Risk Interventions     No data to display

## 2022-07-27 NOTE — Discharge Summary (Addendum)
Physician Discharge Summary   Patient: Charles Jennings MRN: [id number]  DOB: 04-06-1928   Admit:     Date of Admission: 07/25/2022 Admitted from: SNF   Discharge: Date of discharge: 07/27/22 Disposition: Skilled nursing facility Condition at discharge: good  CODE STATUS: DNR     Discharge Physician: Emeterio Reeve, DO [facility]     PCP: Albina Billet, MD  Recommendations for Outpatient Follow-up:  Follow up with PCP Albina Billet, MD in 1-2 weeks Follow up with podiatry office in 5-7 days Please obtain labs/tests: CBC, BMP in 1-2 weeks Please follow up on the following pending results: final blood cultures (negative thus far) Have reduced insulin Have held/changed antihypertensives Continue wound vac until/unless d/c by podiatry    Discharge Instructions     Call MD for:  redness, tenderness, or signs of infection (pain, swelling, redness, odor or green/yellow discharge around incision site)   Complete by: As directed    Call MD for:  severe uncontrolled pain   Complete by: As directed    Diet - low sodium heart healthy   Complete by: As directed    Discharge wound care:   Complete by: As directed    Wound care  Daily      Comments: Wound care to DTPI to left heel, lateral aspect (POA): Cleanse with NS, pat dry. Apply povidone-iodine swabstick, allow to air dry. When dry, cover with dry gauze, wrap LE in Kerlix to prevent MDRPI from NPWT tubing, top with ACE bandage and place foot into Prevalon boot.      Wound care  Every shift      Comments: Wound Care to Stage 2 PI to sacrum: cleanse with NS, pat dry. Cover with silicone foam dressing. Change daily and PRN soiling. Turn patient side to side while in bed.    Negative pressure wound therapy  Every Mon-Wed-Fri 1000    Amount of suction - 125 mm/Hg   Suction Type - Continuous   Increase activity slowly   Complete by: As directed          Discharge Diagnoses: Principal Problem:   Altered  mental status Active Problems:   History of MRSA infection   Insulin dependent type 2 diabetes mellitus ([place])   Mixed hyperlipidemia   Benign essential HTN   Complete heart block (HCC)   Subdural hemorrhage (HCC)   Osteomyelitis (HCC)   PAD (peripheral artery disease) (HCC)   Hypothermia   Acute hypoxic respiratory failure (HCC)   AKI (acute kidney injury) ([place])   History of cardiac pacemaker   Pressure injury of skin   Left rib fracture       Hospital Course: 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 02/20: temperature low down to 96.31F, BP soft but VS otherwise stable. Cr 1.29. CRP 1.7. Lactic and procal no concerns. UA no UTI. CT head nonacute, CXR Patchy retrocardiac consolidation may reflect atelectasis or airspace disease. Minimally displaced acute posterolateral left eighth rib fracture. XR foot negative. Tx w/ Vancomycin, cefepime per pharmacy. Podiatry consulted - wound appear stable, MRI planned to further evaluate however this cannot be done here d/t pacemaker, reordered wound vac.  02/21: Cr improved. Temp improved. Podiatry ok to monitor foot. Wound vac reapplied.  02/22: BCx NGx2d ,  VSS, ok to discharge today     Consultants:  Podiatry   Procedures: none      ASSESSMENT & PLAN:   Principal Problem:   Altered mental status Active Problems:   History of MRSA infection   Insulin dependent type 2 diabetes mellitus ([place])   Mixed hyperlipidemia   Benign essential HTN   Complete heart block (HCC)   Subdural hemorrhage (HCC)   Osteomyelitis (HCC)   PAD (peripheral artery disease) (HCC)   Hypothermia   Acute hypoxic respiratory failure (HCC)   AKI (acute kidney injury) ([place])   History of cardiac  pacemaker   Pressure injury of skin   Left rib fracture  Altered mental status - improved  Ddx severe sepsis with multiorgan involvement, bacteremia, community-acquired pneumonia, hypoglycemia in setting of continued insulin use at facility with acute kidney injury Procalcitonin neg Continue with cefepime and vancomycin per pharmacy --> d/cw/ negative BCx, continue originaly rx po abx    History of MRSA infection Continue contact precautions   Insulin dependent type 2 diabetes mellitus (HCC) Metformin 500 mg p.o. twice daily and Jardiance 10 mg daily resumed  Reduced his 70/30 dose on discharge    Benign essential HTN Home lisinopril held Reduced diltiazem    Left rib fracture Minimally displaced acute posterior lateral eighth rib fracture Continue monitoring and symptomatic support as needed   History of cardiac pacemaker Complete heart block ([place]) Status post dual-chamber pacemaker placement in August 2017 Not a candidate for MRI at [facility] - podiatry aware, appreciate recs --> ok to monitor foot for now, low suspicion for abscess    AKI (acute kidney injury) ([place]) - resolved No prior CKD Baseline serum creatinine has been 0.67-0.88/eGFR > 60 Likely secondary to poor p.o. intake however also concern for hypothermia, source of infection, acute hypoxic respiratory failure, intrarenal secondary to severe sepsis cannot be excluded at this time Monitor BMP   Acute hypoxic respiratory failure ([place]) - resolved Per ED note, On EMS arrival at [facility], patient was noted to have SpO2 saturation of 84% on room air.  Patient oxygen supplementation was improved after 2 L nasal cannula placed and head repositioning Procalcitonin no concerns  CXR no obvious PNA On room air today Incentive spirometry and flutter valve as appropriate and as tolerated Aspiration precaution   Hypothermia - resolved Patient does not meet criteria for SIRS and/or sepsis with regards to vital  signs or labs at this time However given altered mental status, hypothermia, acute hypoxic respiratory failure, and acute kidney injury all POA, this is a change from prior hospitalization especially on 07/12/2022 Possible infectious source of left foot wound, currently without a wound VAC, severe sepsis/bacteremia with multiorgan involvement cannot be excluded at this time Blood cultures x 2 negative thus far    Osteomyelitis ([place]) Left foot wound Podiatry has been consulted, we appreciate further recommendations Continue levaquin and doxy outpatient  Blood cultures x 2 are negative thus far    Subdural hemorrhage ([place]) History of subdural hematoma in August 2017 CT on admission: Read as decreasing size of chronic right subdural hematoma, no mass effect or midline shift             Discharge Instructions  Allergies as of 07/27/2022   No Known Allergies      Medication List     STOP taking these medications    gabapentin 300 MG capsule Commonly known as: NEURONTIN   lisinopril 40 MG tablet Commonly known as: ZESTRIL  TAKE these medications    acetaminophen 325 MG tablet Commonly known as: Tylenol Take 2 tablets (650 mg total) by mouth every 6 (six) hours as needed.   apixaban 2.5 MG Tabs tablet Commonly known as: ELIQUIS Take 1 tablet (2.5 mg total) by mouth 2 (two) times daily.   aspirin EC 81 MG tablet Take 1 tablet (81 mg total) by mouth daily. Swallow whole.   atorvastatin 40 MG tablet Commonly known as: LIPITOR Take 1 tablet (40 mg total) by mouth every evening.   diltiazem 120 MG 24 hr capsule Commonly known as: CARDIZEM CD Take 1 capsule (120 mg total) by mouth daily. What changed:  medication strength how much to take   doxycycline 100 MG tablet Commonly known as: VIBRA-TABS Take 1 tablet (100 mg total) by mouth every 12 (twelve) hours for 14 days.   Jardiance 10 MG Tabs tablet Generic drug: empagliflozin Take 10 mg by mouth  daily.   levofloxacin 500 MG tablet Commonly known as: LEVAQUIN Take 1 tablet (500 mg total) by mouth at bedtime for 14 days.   loratadine 10 MG tablet Commonly known as: CLARITIN Take 1 tablet by mouth daily.   magnesium oxide 400 MG tablet Commonly known as: MAG-OX Take 1 tablet (400 mg total) by mouth daily for 14 days.   metFORMIN 500 MG tablet Commonly known as: GLUCOPHAGE Take 500 mg by mouth 2 (two) times daily with a meal.   NovoLOG Mix 70/30 FlexPen (70-30) 100 UNIT/ML FlexPen Generic drug: insulin aspart protamine - aspart Inject 8 Units into the skin daily with breakfast. Hold if less than 150 What changed: how much to take   Pro-Stat [facility] Liqd Take by mouth.               Discharge Care Instructions  (From admission, onward)           Start     Ordered   07/27/22 0000  Discharge wound care:       Comments: Wound care  Daily      Comments: Wound care to DTPI to left heel, lateral aspect (POA): Cleanse with NS, pat dry. Apply povidone-iodine swabstick, allow to air dry. When dry, cover with dry gauze, wrap LE in Kerlix to prevent MDRPI from NPWT tubing, top with ACE bandage and place foot into Prevalon boot.      Wound care  Every shift      Comments: Wound Care to Stage 2 PI to sacrum: cleanse with NS, pat dry. Cover with silicone foam dressing. Change daily and PRN soiling. Turn patient side to side while in bed.    Negative pressure wound therapy  Every Mon-Wed-Fri 1000    Amount of suction - 125 mm/Hg   Suction Type - Continuous   07/27/22 1129             Contact information for after-discharge care     Destination     [facility] .   Service: Skilled Nursing Contact information: [address] [place] [phone]                     No Known Allergies   Subjective: pt has no complaints this morning    Discharge Exam: BP (!) 141/59 (BP Location: Left Arm)    Pulse 95   Temp 97.8 F (36.6 C) (Oral)   Resp 16   Wt 65.1 kg   SpO2 (!) 89%   BMI  22.15 kg/m  General: Pt is alert, awake, not in acute distress Cardiovascular: RRR, S1/S2 +murmur, no rubs, no gallops Respiratory: CTA bilaterally, no wheezing, no rhonchi Abdominal: Soft, NT, ND, Extremities: no edema, no cyanosis, wound vac in place and surrounding skin appears normal/healing.      The results of significant diagnostics from this hospitalization (including imaging, microbiology, ancillary and laboratory) are listed below for reference.     Microbiology: Recent Results (from the past 240 hour(s))  Blood culture (routine x 2)     Status: None (Preliminary result)   Collection Time: 07/25/22 11:40 AM   Specimen: BLOOD RIGHT HAND  Result Value [place] Status   Specimen Description BLOOD RIGHT HAND  Final   Special Requests   Final    BOTTLES DRAWN AEROBIC AND ANAEROBIC Blood Culture adequate volume   Culture   Final    NO GROWTH 2 DAYS Performed at [facility], [address] [place], [place] [address]    Report Status PENDING  Incomplete  Blood culture (routine x 2)     Status: None (Preliminary result)   Collection Time: 07/25/22  1:16 PM   Specimen: BLOOD  Result Value [place] Status   Specimen Description BLOOD BLOOD RIGHT HAND  Final   Special Requests   Final    BOTTLES DRAWN AEROBIC AND ANAEROBIC Blood Culture adequate volume   Culture   Final    NO GROWTH 2 DAYS Performed at [facility], [address] [place], [place] [address]    Report Status PENDING  Incomplete     Labs: BNP (last 3 results) No results for input(s): "BNP" in the last 8760 hours. Basic Metabolic Panel: Recent Labs  Lab 07/21/22 0436 07/25/22 1050 07/26/22 0338 07/27/22 0510  NA 133* 135 137 136  K 3.3* 3.8 4.0 3.9  CL 100 100 104 103  CO2 26 23 22 23  $ GLUCOSE 145* 91 92 62*  BUN 8 20 17 17  $ CREATININE 0.74 1.29* 1.06 0.94  CALCIUM 7.0* 8.3* 7.8*  8.0*  MG 1.4*  --  1.5*  --   PHOS 2.3*  --  3.3  --    Liver Function Tests: Recent Labs  Lab 07/25/22 1050  AST 42*  ALT 27  ALKPHOS 173*  BILITOT 1.3*  PROT 5.3*  ALBUMIN 2.4*   No results for input(s): "LIPASE", "AMYLASE" in the last 168 hours. No results for input(s): "AMMONIA" in the last 168 hours. CBC: Recent Labs  Lab 07/21/22 0436 07/25/22 1050 07/26/22 0338 07/27/22 0510  WBC 6.0 10.0 7.3 8.5  NEUTROABS  --  7.1  --   --   HGB 12.6* 13.3 12.0* 11.7*  HCT 37.8* 41.0 37.2* 35.4*  MCV 87.3 88.9 88.4 87.6  PLT 213 319 232 226   Cardiac Enzymes: No results for input(s): "CKTOTAL", "CKMB", "CKMBINDEX", "TROPONINI" in the last 168 hours. BNP: Invalid input(s): "POCBNP" CBG: Recent Labs  Lab 07/26/22 1231 07/26/22 1630 07/26/22 2136 07/27/22 0828 07/27/22 0909  GLUCAP 100* 130* 80 58* 83   D-Dimer No results for input(s): "DDIMER" in the last 72 hours. Hgb A1c No results for input(s): "HGBA1C" in the last 72 hours. Lipid Profile No results for input(s): "CHOL", "HDL", "LDLCALC", "TRIG", "CHOLHDL", "LDLDIRECT" in the last 72 hours. Thyroid function studies No results for input(s): "TSH", "T4TOTAL", "T3FREE", "THYROIDAB" in the last 72 hours.  Invalid input(s): "FREET3" Anemia work up No results for input(s): "VITAMINB12", "FOLATE", "FERRITIN", "TIBC", "IRON", "RETICCTPCT" in the last  72 hours. Urinalysis    Component Value Date/Time   COLORURINE YELLOW (A) 07/25/2022 1212   APPEARANCEUR CLEAR (A) 07/25/2022 1212   APPEARANCEUR Clear 01/22/2013 1826   LABSPEC 1.017 07/25/2022 1212   LABSPEC 1.005 01/22/2013 1826   PHURINE 5.0 07/25/2022 1212   GLUCOSEU >=500 (A) 07/25/2022 1212   GLUCOSEU 50 mg/dL 01/22/2013 1826   HGBUR MODERATE (A) 07/25/2022 1212   BILIRUBINUR NEGATIVE 07/25/2022 1212   BILIRUBINUR Negative 01/22/2013 1826   KETONESUR 20 (A) 07/25/2022 1212   PROTEINUR 30 (A) 07/25/2022 1212   NITRITE NEGATIVE 07/25/2022 1212    LEUKOCYTESUR NEGATIVE 07/25/2022 1212   LEUKOCYTESUR Negative 01/22/2013 1826   Sepsis Labs Recent Labs  Lab 07/21/22 0436 07/25/22 1050 07/26/22 0338 07/27/22 0510  WBC 6.0 10.0 7.3 8.5   Microbiology Recent Results (from the past 240 hour(s))  Blood culture (routine x 2)     Status: None (Preliminary result)   Collection Time: 07/25/22 11:40 AM   Specimen: BLOOD RIGHT HAND  Result Value [place] Status   Specimen Description BLOOD RIGHT HAND  Final   Special Requests   Final    BOTTLES DRAWN AEROBIC AND ANAEROBIC Blood Culture adequate volume   Culture   Final    NO GROWTH 2 DAYS Performed at [facility], [address] [place], [place] [address]    Report Status PENDING  Incomplete  Blood culture (routine x 2)     Status: None (Preliminary result)   Collection Time: 07/25/22  1:16 PM   Specimen: BLOOD  Result Value [place] Status   Specimen Description BLOOD BLOOD RIGHT HAND  Final   Special Requests   Final    BOTTLES DRAWN AEROBIC AND ANAEROBIC Blood Culture adequate volume   Culture   Final    NO GROWTH 2 DAYS Performed at [facility], [address], [place], [place] [address]    Report Status PENDING  Incomplete   Imaging DG Chest Port 1 View  Result Date: 07/25/2022 CLINICAL DATA:  Altered level of consciousness, sirs EXAM: PORTABLE CHEST 1 VIEW COMPARISON:  01/28/2016 FINDINGS: Single frontal view of the chest demonstrates dual lead pacer overlying left chest, proximal lead overlying right atrium and distal lead overlying right ventricle. Cardiac silhouette is unremarkable. Atherosclerosis of the aortic arch. There is patchy consolidation in the retrocardiac region which could reflect atelectasis or airspace disease. No effusion or pneumothorax. Minimally displaced left posterolateral eighth rib fracture. IMPRESSION: 1. Patchy retrocardiac consolidation may reflect atelectasis or airspace disease. 2. Minimally displaced acute  posterolateral left eighth rib fracture. Electronically Signed   By: Randa Ngo M.D.   On: 07/25/2022 15:25   DG Foot Complete Left  Result Date: 07/25/2022 CLINICAL DATA:  Left foot pain, wound EXAM: LEFT FOOT - COMPLETE 3+ VIEW COMPARISON:  07/13/2022 FINDINGS: Interval amputation of the second toe at the MTP joint. Previous amputation of the third toe. Diffuse bony demineralization. Flexion of the remaining fourth and fifth toes causes bony superimposition and reduced sensitivity of assessment of these toes. Subtle periosteal reaction along the shafts of the second, third, fourth metatarsals best appreciated on the oblique projection. No bony destructive findings. Hazy lucency along the presume soft tissue flap distal to the amputation sites could represent wound or small amount of gas in the soft tissues. There is also a suggestion of a dorsal soft tissue defect along the distal forefoot on the lateral projection possibly from ulceration/wound Os peroneus noted. Plantar calcaneal spur. Vascular calcifications noted.  IMPRESSION: 1. Interval amputation of the second toe at the MTP joint. Previous amputation of the third toe. 2. Subtle periosteal reaction along the shafts of the second, third, and fourth metatarsals, but no bony destructive findings specific for active osteomyelitis. 3. Hazy lucency along the presume soft tissue flap distal to the amputation sites could represent wound or small amount of gas in the soft tissues. There is also a dorsal soft tissue defect along the distal forefoot on the lateral projection possibly from ulceration/wound. Electronically Signed   By: Van Clines M.D.   On: 07/25/2022 12:29   CT HEAD WO CONTRAST (5MM)  Result Date: 07/25/2022 CLINICAL DATA:  Altered level of consciousness, headache EXAM: CT HEAD WITHOUT CONTRAST TECHNIQUE: Contiguous axial images were obtained from the base of the skull through the vertex without intravenous contrast. RADIATION DOSE  REDUCTION: This exam was performed according to the departmental dose-optimization program which includes automated exposure control, adjustment of the mA and/or kV according to patient size and/or use of iterative reconstruction technique. COMPARISON:  01/28/2016 FINDINGS: Brain: There is atrophy and chronic small vessel disease changes. Chronic low-density right subdural hematoma noted measuring 4 mm in thickness compared with 13 mm previously. No mass effect or midline shift. No acute hemorrhage or hydrocephalus. Vascular: No hyperdense vessel or unexpected calcification. Skull: No acute calvarial abnormality. Sinuses/Orbits: No acute findings Other: None IMPRESSION: Decreasing size of the chronic right subdural hematoma, 4 mm currently compared to 13 mm previously. No mass effect or midline shift. Atrophy, chronic microvascular disease. No acute intracranial abnormality. Electronically Signed   By: Rolm Baptise M.D.   On: 07/25/2022 11:24      Time coordinating discharge: less than 30 minutes  SIGNED:  Emeterio Reeve DO [facility]

## 2022-07-28 ENCOUNTER — Emergency Department: Payer: Medicare Other

## 2022-07-28 ENCOUNTER — Inpatient Hospital Stay
Admission: EM | Admit: 2022-07-28 | Discharge: 2022-07-31 | DRG: 177 | Disposition: A | Discharge status: Hospice | Payer: Medicare Other | Source: Skilled Nursing Facility | Attending: Student | Admitting: Student

## 2022-07-28 DIAGNOSIS — I442 Atrioventricular block, complete: Secondary | ICD-10-CM | POA: Diagnosis present

## 2022-07-28 DIAGNOSIS — Z9862 Peripheral vascular angioplasty status: Secondary | ICD-10-CM

## 2022-07-28 DIAGNOSIS — Z7901 Long term (current) use of anticoagulants: Secondary | ICD-10-CM

## 2022-07-28 DIAGNOSIS — J90 Pleural effusion, not elsewhere classified: Secondary | ICD-10-CM | POA: Diagnosis present

## 2022-07-28 DIAGNOSIS — R9431 Abnormal electrocardiogram [ECG] [EKG]: Secondary | ICD-10-CM | POA: Diagnosis present

## 2022-07-28 DIAGNOSIS — I5032 Chronic diastolic (congestive) heart failure: Secondary | ICD-10-CM | POA: Diagnosis present

## 2022-07-28 DIAGNOSIS — J189 Pneumonia, unspecified organism: Secondary | ICD-10-CM

## 2022-07-28 DIAGNOSIS — Z8249 Family history of ischemic heart disease and other diseases of the circulatory system: Secondary | ICD-10-CM

## 2022-07-28 DIAGNOSIS — Z950 Presence of cardiac pacemaker: Secondary | ICD-10-CM

## 2022-07-28 DIAGNOSIS — L89626 Pressure-induced deep tissue damage of left heel: Secondary | ICD-10-CM | POA: Diagnosis present

## 2022-07-28 DIAGNOSIS — Z7902 Long term (current) use of antithrombotics/antiplatelets: Secondary | ICD-10-CM

## 2022-07-28 DIAGNOSIS — I10 Essential (primary) hypertension: Secondary | ICD-10-CM | POA: Insufficient documentation

## 2022-07-28 DIAGNOSIS — N1831 Chronic kidney disease, stage 3a: Secondary | ICD-10-CM | POA: Diagnosis present

## 2022-07-28 DIAGNOSIS — E11621 Type 2 diabetes mellitus with foot ulcer: Secondary | ICD-10-CM | POA: Diagnosis present

## 2022-07-28 DIAGNOSIS — G9341 Metabolic encephalopathy: Secondary | ICD-10-CM

## 2022-07-28 DIAGNOSIS — Z634 Disappearance and death of family member: Secondary | ICD-10-CM

## 2022-07-28 DIAGNOSIS — R4182 Altered mental status, unspecified: Secondary | ICD-10-CM | POA: Diagnosis not present

## 2022-07-28 DIAGNOSIS — Z8619 Personal history of other infectious and parasitic diseases: Secondary | ICD-10-CM

## 2022-07-28 DIAGNOSIS — E871 Hypo-osmolality and hyponatremia: Secondary | ICD-10-CM | POA: Diagnosis present

## 2022-07-28 DIAGNOSIS — M1990 Unspecified osteoarthritis, unspecified site: Secondary | ICD-10-CM | POA: Diagnosis present

## 2022-07-28 DIAGNOSIS — Z85828 Personal history of other malignant neoplasm of skin: Secondary | ICD-10-CM

## 2022-07-28 DIAGNOSIS — E785 Hyperlipidemia, unspecified: Secondary | ICD-10-CM | POA: Insufficient documentation

## 2022-07-28 DIAGNOSIS — Z7982 Long term (current) use of aspirin: Secondary | ICD-10-CM

## 2022-07-28 DIAGNOSIS — Z794 Long term (current) use of insulin: Secondary | ICD-10-CM

## 2022-07-28 DIAGNOSIS — L89311 Pressure ulcer of right buttock, stage 1: Secondary | ICD-10-CM | POA: Diagnosis present

## 2022-07-28 DIAGNOSIS — J9601 Acute respiratory failure with hypoxia: Secondary | ICD-10-CM | POA: Diagnosis not present

## 2022-07-28 DIAGNOSIS — Z79899 Other long term (current) drug therapy: Secondary | ICD-10-CM

## 2022-07-28 DIAGNOSIS — Z66 Do not resuscitate: Secondary | ICD-10-CM | POA: Diagnosis present

## 2022-07-28 DIAGNOSIS — J690 Pneumonitis due to inhalation of food and vomit: Secondary | ICD-10-CM | POA: Diagnosis not present

## 2022-07-28 DIAGNOSIS — E1142 Type 2 diabetes mellitus with diabetic polyneuropathy: Secondary | ICD-10-CM | POA: Diagnosis not present

## 2022-07-28 DIAGNOSIS — L97529 Non-pressure chronic ulcer of other part of left foot with unspecified severity: Secondary | ICD-10-CM | POA: Diagnosis present

## 2022-07-28 DIAGNOSIS — I6203 Nontraumatic chronic subdural hemorrhage: Secondary | ICD-10-CM | POA: Diagnosis present

## 2022-07-28 DIAGNOSIS — Z89422 Acquired absence of other left toe(s): Secondary | ICD-10-CM

## 2022-07-28 DIAGNOSIS — I130 Hypertensive heart and chronic kidney disease with heart failure and stage 1 through stage 4 chronic kidney disease, or unspecified chronic kidney disease: Secondary | ICD-10-CM | POA: Diagnosis present

## 2022-07-28 DIAGNOSIS — Z7984 Long term (current) use of oral hypoglycemic drugs: Secondary | ICD-10-CM

## 2022-07-28 DIAGNOSIS — Z515 Encounter for palliative care: Secondary | ICD-10-CM

## 2022-07-28 DIAGNOSIS — E1122 Type 2 diabetes mellitus with diabetic chronic kidney disease: Secondary | ICD-10-CM | POA: Diagnosis present

## 2022-07-28 DIAGNOSIS — E1152 Type 2 diabetes mellitus with diabetic peripheral angiopathy with gangrene: Secondary | ICD-10-CM | POA: Diagnosis present

## 2022-07-28 DIAGNOSIS — F22 Delusional disorders: Secondary | ICD-10-CM | POA: Diagnosis present

## 2022-07-28 LAB — BLOOD GAS, VENOUS
Acid-base deficit: 1.7 mmol/L (ref 0.0–2.0)
Bicarbonate: 24.3 mmol/L (ref 20.0–28.0)
O2 Saturation: 60.7 %
Patient temperature: 37
pCO2, Ven: 45 mmHg (ref 44–60)
pH, Ven: 7.34 (ref 7.25–7.43)
pO2, Ven: 37 mmHg (ref 32–45)

## 2022-07-28 LAB — CBC WITH DIFFERENTIAL/PLATELET
Abs Immature Granulocytes: 0.03 10*3/uL (ref 0.00–0.07)
Basophils Absolute: 0.1 10*3/uL (ref 0.0–0.1)
Basophils Relative: 1 %
Eosinophils Absolute: 0 10*3/uL (ref 0.0–0.5)
Eosinophils Relative: 0 %
HCT: 41.5 % (ref 39.0–52.0)
Hemoglobin: 13.3 g/dL (ref 13.0–17.0)
Immature Granulocytes: 0 %
Lymphocytes Relative: 25 %
Lymphs Abs: 1.8 10*3/uL (ref 0.7–4.0)
MCH: 29.3 pg (ref 26.0–34.0)
MCHC: 32 g/dL (ref 30.0–36.0)
MCV: 91.4 fL (ref 80.0–100.0)
Monocytes Absolute: 0.9 10*3/uL (ref 0.1–1.0)
Monocytes Relative: 12 %
Neutro Abs: 4.7 10*3/uL (ref 1.7–7.7)
Neutrophils Relative %: 62 %
Platelets: 184 10*3/uL (ref 150–400)
RBC: 4.54 MIL/uL (ref 4.22–5.81)
RDW: 13.8 % (ref 11.5–15.5)
WBC: 7.5 10*3/uL (ref 4.0–10.5)
nRBC: 0 % (ref 0.0–0.2)

## 2022-07-28 LAB — COMPREHENSIVE METABOLIC PANEL
ALT: 24 U/L (ref 0–44)
AST: 31 U/L (ref 15–41)
Albumin: 2 g/dL — ABNORMAL LOW (ref 3.5–5.0)
Alkaline Phosphatase: 143 U/L — ABNORMAL HIGH (ref 38–126)
Anion gap: 15 (ref 5–15)
BUN: 12 mg/dL (ref 8–23)
CO2: 20 mmol/L — ABNORMAL LOW (ref 22–32)
Calcium: 8.1 mg/dL — ABNORMAL LOW (ref 8.9–10.3)
Chloride: 99 mmol/L (ref 98–111)
Creatinine, Ser: 0.89 mg/dL (ref 0.61–1.24)
GFR, Estimated: >60 mL/min (ref >60)
Glucose, Bld: 84 mg/dL (ref 70–99)
Potassium: 3.8 mmol/L (ref 3.5–5.1)
Sodium: 134 mmol/L — ABNORMAL LOW (ref 135–145)
Total Bilirubin: 1.2 mg/dL (ref 0.3–1.2)
Total Protein: 4.7 g/dL — ABNORMAL LOW (ref 6.5–8.1)

## 2022-07-28 LAB — GLUCOSE, CAPILLARY: Glucose-Capillary: 108 mg/dL — ABNORMAL HIGH (ref 70–99)

## 2022-07-28 LAB — LACTIC ACID, PLASMA
Lactic Acid, Venous: 1.2 mmol/L (ref 0.5–1.9)
Lactic Acid, Venous: 1.1 mmol/L (ref 0.5–1.9)

## 2022-07-28 LAB — AMMONIA: Ammonia: 12 umol/L (ref 9–35)

## 2022-07-28 MED ORDER — SODIUM CHLORIDE 0.9 % IV SOLN
2.0000 g | Freq: Once | INTRAVENOUS | Status: AC
  Administered 2022-07-28: 2 g via INTRAVENOUS
  Filled 2022-07-28: qty 12.5

## 2022-07-28 MED ORDER — VANCOMYCIN HCL IN DEXTROSE 1-5 GM/200ML-% IV SOLN
1000.0000 mg | Freq: Once | INTRAVENOUS | Status: AC
  Administered 2022-07-28: 1000 mg via INTRAVENOUS
  Filled 2022-07-28: qty 200

## 2022-07-28 MED ORDER — MAGNESIUM SULFATE 2 GM/50ML IV SOLN
2.0000 g | Freq: Once | INTRAVENOUS | Status: AC
  Administered 2022-07-29: 2 g via INTRAVENOUS
  Filled 2022-07-28: qty 50

## 2022-07-28 MED ORDER — PIPERACILLIN-TAZOBACTAM 3.375 G IVPB 30 MIN
3.3750 g | Freq: Four times a day (QID) | INTRAVENOUS | Status: DC
  Administered 2022-07-29: 3.375 g via INTRAVENOUS
  Filled 2022-07-28 (×5): qty 50

## 2022-07-28 MED ORDER — MAGNESIUM OXIDE 400 MG PO TABS
400.0000 mg | ORAL_TABLET | Freq: Every day | ORAL | Status: DC
  Administered 2022-07-29 – 2022-07-30 (×2): 400 mg via ORAL
  Filled 2022-07-28 (×6): qty 1

## 2022-07-28 MED ORDER — SODIUM CHLORIDE 0.9 % IV SOLN
500.0000 mg | INTRAVENOUS | Status: DC
  Administered 2022-07-28 – 2022-07-30 (×3): 500 mg via INTRAVENOUS
  Filled 2022-07-28: qty 5
  Filled 2022-07-28: qty 500
  Filled 2022-07-28: qty 5

## 2022-07-28 MED ORDER — DILTIAZEM HCL ER COATED BEADS 120 MG PO CP24
120.0000 mg | ORAL_CAPSULE | Freq: Every day | ORAL | Status: DC
  Administered 2022-07-29: 120 mg via ORAL
  Filled 2022-07-28 (×3): qty 1

## 2022-07-28 MED ORDER — TRAZODONE HCL 50 MG PO TABS
25.0000 mg | ORAL_TABLET | Freq: Every evening | ORAL | Status: DC | PRN
  Administered 2022-07-29: 25 mg via ORAL
  Filled 2022-07-28: qty 1

## 2022-07-28 MED ORDER — ACETAMINOPHEN 325 MG PO TABS: 650.0000 mg | ORAL_TABLET | Freq: Four times a day (QID) | ORAL | Status: DC | PRN

## 2022-07-28 MED ORDER — ONDANSETRON HCL 4 MG PO TABS: 4.0000 mg | ORAL_TABLET | Freq: Four times a day (QID) | ORAL | Status: DC | PRN

## 2022-07-28 MED ORDER — EMPAGLIFLOZIN 10 MG PO TABS
10.0000 mg | ORAL_TABLET | Freq: Every day | ORAL | Status: DC
  Filled 2022-07-28: qty 1

## 2022-07-28 MED ORDER — ASPIRIN 81 MG PO TBEC
81.0000 mg | DELAYED_RELEASE_TABLET | Freq: Every day | ORAL | Status: DC
  Administered 2022-07-29 – 2022-07-30 (×2): 81 mg via ORAL
  Filled 2022-07-28 (×3): qty 1

## 2022-07-28 MED ORDER — APIXABAN 2.5 MG PO TABS
2.5000 mg | ORAL_TABLET | Freq: Two times a day (BID) | ORAL | Status: DC
  Administered 2022-07-29 – 2022-07-30 (×3): 2.5 mg via ORAL
  Filled 2022-07-28 (×5): qty 1

## 2022-07-28 MED ORDER — ATORVASTATIN CALCIUM 20 MG PO TABS
40.0000 mg | ORAL_TABLET | Freq: Every evening | ORAL | Status: DC
  Administered 2022-07-29 – 2022-07-30 (×2): 40 mg via ORAL
  Filled 2022-07-28 (×2): qty 2

## 2022-07-28 MED ORDER — INSULIN ASPART 100 UNIT/ML IJ SOLN
0.0000 [IU] | Freq: Three times a day (TID) | INTRAMUSCULAR | Status: DC
  Administered 2022-07-29 – 2022-07-30 (×2): 2 [IU] via SUBCUTANEOUS
  Filled 2022-07-28 (×2): qty 1

## 2022-07-28 MED ORDER — INSULIN ASPART PROT & ASPART (70-30 MIX) 100 UNIT/ML ~~LOC~~ SUSP
8.0000 [IU] | Freq: Every day | SUBCUTANEOUS | Status: DC
  Filled 2022-07-28: qty 10

## 2022-07-28 MED ORDER — INSULIN ASPART 100 UNIT/ML IJ SOLN
0.0000 [IU] | Freq: Every day | INTRAMUSCULAR | Status: DC
  Administered 2022-07-29: 2 [IU] via SUBCUTANEOUS
  Filled 2022-07-28: qty 1

## 2022-07-28 MED ORDER — MAGNESIUM HYDROXIDE 400 MG/5ML PO SUSP: 30.0000 mL | Freq: Every day | ORAL | Status: DC | PRN

## 2022-07-28 MED ORDER — SODIUM CHLORIDE 0.9 % IV SOLN: INTRAVENOUS | Status: DC

## 2022-07-28 MED ORDER — ONDANSETRON HCL 4 MG/2ML IJ SOLN: 4.0000 mg | Freq: Four times a day (QID) | INTRAMUSCULAR | Status: DC | PRN

## 2022-07-28 MED ORDER — ACETAMINOPHEN 650 MG RE SUPP: 650.0000 mg | Freq: Four times a day (QID) | RECTAL | Status: DC | PRN

## 2022-07-28 NOTE — H&P (Addendum)
[place]   PATIENT NAME: Charles Jennings    MR#:  [id number]  DATE OF BIRTH:  [birth year]  DATE OF ADMISSION:  07/28/2022  PRIMARY CARE PHYSICIAN: Albina Billet, MD   Patient is coming from: SNF  REQUESTING/REFERRING PHYSICIAN: Nance Pear, MD  CHIEF COMPLAINT:   Chief Complaint  Patient presents with   Weakness  Altered mental status  HISTORY OF PRESENT ILLNESS:  Charles Jennings is a 89 y.o. male with medical history significant for hypertension, dyslipidemia, peripheral vascular disease, type 2 diabetes mellitus, HFpEF who was just discharged yesterday for management of severe sepsis with multiorgan involvement and bacteremia and community-acquired pneumonia and hypoglycemia with AKI.  The patient was discharged to SNF and was noted to be unresponsive today requiring sternal rub to wake him up.  He was unable to give any history.  No reported fever or chills.  No reported nausea or vomiting.  No reported chest pain or palpitations.  No reported worsening cough or wheezing or dyspnea. . ED Course: When he came to the ER, BP was 150/66 with pulse currently of 100% on 3 L of O2 by nasal cannula, heart rate of 91 and later 93 and otherwise normal vital signs.  Labs revealed mild hyponatremia 134 and a CO2 of 20 with calcium of 8.1, alk phos 143 and albumin 2 total protein of 4.7.  Ammonia level was 12.  VBG showed pH 7.34 and bicarbonate of 24.3.  Lactic acid was 1.2 and later 1.1.  CBC was within normal.  Blood glucose was 84.  Blood cultures were drawn. EKG as reviewed by me : Paced rhythm with a rate of 93 Imaging: Noncontrast head CT scan revealed no acute intracranial abnormality.  It showed severe chronic atrophy and small vessel ischemic changes with small 4 mm deep right frontoparietal chronic subdural hematoma that is unchanged from prior study.  The patient was given IV vancomycin and cefepime.  He will be admitted to a medical telemetry bed for further evaluation  and management.   PAST MEDICAL HISTORY:   Past Medical History:  Diagnosis Date   (HFpEF) heart failure with preserved ejection fraction (HCC)    Arthritis    Complete heart block ([place])    a.) s/p dual chamber Medtronic PPM placement 01/31/2016   Frequent PVCs    Hemorrhoids    a.) s/p banding 01/2021   HLD (hyperlipidemia)    Hypertension    Long term current use of antithrombotics/antiplatelets    a.) on DAPT (ASA + clopidogrel)   Peripheral vascular disease ([place])    Presence of permanent cardiac pacemaker 01/31/2016   a.) s/p Advisa DR MRI SureScan dual chamber PPM (SN: [id number])   SDH (subdural hematoma) (HCC)    Skin cancer    Type 2 diabetes mellitus treated with insulin ([place])     PAST SURGICAL HISTORY:   Past Surgical History:  Procedure Laterality Date   AMPUTATION TOE Left 06/30/2022   Procedure: AMPUTATION TOE - [place];  Surgeon: Sharlotte Alamo, DPM;  Location: [facility];  Service: Podiatry;  Laterality: Left;   AMPUTATION TOE Left 07/16/2022   Procedure: AMPUTATION TOE;  Surgeon: Samara Deist, DPM;  Location: [facility];  Service: Podiatry;  Laterality: Left;   EYE SURGERY     FEMORAL ENDARTERECTOMY Bilateral    HEMORRHOID BANDING N/A 01/2021   HERNIA REPAIR     double hernia   LOWER EXTREMITY ANGIOGRAPHY Left 06/12/2022   Procedure: Lower Extremity  Angiography;  Surgeon: Algernon Huxley, MD;  Location: [place] [facility];  Service: Cardiovascular;  Laterality: Left;   LOWER EXTREMITY ANGIOGRAPHY Left 07/13/2022   Procedure: Lower Extremity Angiography;  Surgeon: Algernon Huxley, MD;  Location: [place] [facility];  Service: Cardiovascular;  Laterality: Left;   LOWER EXTREMITY ANGIOGRAPHY Left 07/14/2022   Procedure: Lower Extremity Angiography;  Surgeon: Algernon Huxley, MD;  Location: [place] [facility];  Service: Cardiovascular;  Laterality: Left;   LOWER EXTREMITY INTERVENTION Bilateral 06/14/2022   Procedure: LOWER EXTREMITY INTERVENTION;   Surgeon: Algernon Huxley, MD;  Location: [place] [facility];  Service: Cardiovascular;  Laterality: Bilateral;   PACEMAKER INSERTION Left 01/31/2016   Procedure: PACEMAKER INSERTION; Location: [facility]; Surgeon: Dimas Alexandria, MD    SOCIAL HISTORY:   Social History   Tobacco Use   Smoking status: Never   Smokeless tobacco: Never  Substance Use Topics   Alcohol use: Not Currently    FAMILY HISTORY:   Family History  Problem Relation Age of Onset   Liver disease Father    Hypertension Brother    Heart attack Brother    Alpha-1 antitrypsin deficiency Granddaughter     DRUG ALLERGIES:  No Known Allergies  REVIEW OF SYSTEMS:   ROS As per history of present illness. All pertinent systems were reviewed above. Constitutional, HEENT, cardiovascular, respiratory, GI, GU, musculoskeletal, neuro, psychiatric, endocrine, integumentary and hematologic systems were reviewed and are otherwise negative/unremarkable except for positive findings mentioned above in the HPI.   MEDICATIONS AT HOME:   Prior to Admission medications   Medication Sig Start Date End Date Taking? Authorizing Provider  acetaminophen (TYLENOL) 325 MG tablet Take 2 tablets (650 mg total) by mouth every 6 (six) hours as needed. 07/21/22 07/21/23  Val Riles, MD  Amino Acids-Protein Hydrolys (PRO-STAT [facility]) LIQD Take by mouth. 07/24/22   [provider]  apixaban (ELIQUIS) 2.5 MG TABS tablet Take 1 tablet (2.5 mg total) by mouth 2 (two) times daily. 07/21/22 07/21/23  Val Riles, MD  aspirin EC 81 MG tablet Take 1 tablet (81 mg total) by mouth daily. Swallow whole. 07/22/22   Val Riles, MD  atorvastatin (LIPITOR) 40 MG tablet Take 1 tablet (40 mg total) by mouth every evening. 06/15/22 07/25/22  Richarda Osmond, MD  diltiazem (CARDIZEM CD) 120 MG 24 hr capsule Take 1 capsule (120 mg total) by mouth daily. 07/27/22   Emeterio Reeve, DO  doxycycline (VIBRA-TABS) 100 MG tablet Take 1 tablet (100 mg total) by  mouth every 12 (twelve) hours for 14 days. 07/21/22 08/04/22  Val Riles, MD  JARDIANCE 10 MG TABS tablet Take 10 mg by mouth daily.    [provider]  levofloxacin (LEVAQUIN) 500 MG tablet Take 1 tablet (500 mg total) by mouth at bedtime for 14 days. 07/21/22 08/04/22  Val Riles, MD  loratadine (CLARITIN) 10 MG tablet Take 1 tablet by mouth daily. 11/30/15   [provider]  magnesium oxide (MAG-OX) 400 MG tablet Take 1 tablet (400 mg total) by mouth daily for 14 days. 07/21/22 08/04/22  Val Riles, MD  metFORMIN (GLUCOPHAGE) 500 MG tablet Take 500 mg by mouth 2 (two) times daily with a meal. 11/30/15   [provider]  NOVOLOG MIX 70/30 FLEXPEN (70-30) 100 UNIT/ML FlexPen Inject 8 Units into the skin daily with breakfast. Hold if less than 150 07/27/22   Emeterio Reeve, DO      VITAL SIGNS:  Blood pressure (!) 142/60, pulse 75, temperature  97.9 F (36.6 C), resp. rate 12, height '5\' 8"'$  (1.727 m), weight 66 kg, SpO2 100 %.  PHYSICAL EXAMINATION:  Physical Exam  GENERAL:  89 y.o.-year-old Caucasian male patient lying in the bed with no acute distress.  He was unresponsive except to painful stimuli blinking his eyes. EYES: Pupils equal, round, reactive to light and accommodation. No scleral icterus. Extraocular muscles intact.  HEENT: Head atraumatic, normocephalic. Oropharynx and nasopharynx clear.  NECK:  Supple, no jugular venous distention. No thyroid enlargement, no tenderness.  LUNGS: Diminished bibasal breath sounds with bibasal rales.. No use of accessory muscles of respiration.  CARDIOVASCULAR: Regular rate and rhythm, S1, S2 normal. No murmurs, rubs, or gallops.  ABDOMEN: Soft, nondistended, nontender. Bowel sounds present. No organomegaly or mass.  EXTREMITIES: No pedal edema, cyanosis, or clubbing.  NEUROLOGIC: No lateralizing signs.  He is very somnolent to cope rate with exam. PSYCHIATRIC: The patient is unresponsive except to painful  stimuli. SKIN: No obvious rash, lesion, or ulcer.   LABORATORY PANEL:   CBC Recent Labs  Lab 07/28/22 1701  WBC 7.5  HGB 13.3  HCT 41.5  PLT 184   ------------------------------------------------------------------------------------------------------------------  Chemistries  Recent Labs  Lab 07/26/22 0338 07/27/22 0510 07/28/22 1701  NA 137   < > 134*  K 4.0   < > 3.8  CL 104   < > 99  CO2 22   < > 20*  GLUCOSE 92   < > 84  BUN 17   < > 12  CREATININE 1.06   < > 0.89  CALCIUM 7.8*   < > 8.1*  MG 1.5*  --   --   AST  --   --  31  ALT  --   --  24  ALKPHOS  --   --  143*  BILITOT  --   --  1.2   < > = values in this interval not displayed.   ------------------------------------------------------------------------------------------------------------------  Cardiac Enzymes No results for input(s): "TROPONINI" in the last 168 hours. ------------------------------------------------------------------------------------------------------------------  RADIOLOGY:  CT Head Wo Contrast  Result Date: 07/28/2022 CLINICAL DATA:  Altered mental status.  Nonresponsive. EXAM: CT HEAD WITHOUT CONTRAST TECHNIQUE: Contiguous axial images were obtained from the base of the skull through the vertex without intravenous contrast. RADIATION DOSE REDUCTION: This exam was performed according to the departmental dose-optimization program which includes automated exposure control, adjustment of the mA and/or kV according to patient size and/or use of iterative reconstruction technique. COMPARISON:  07/25/2022 FINDINGS: Brain: Diffuse severe cortical atrophy. Ventricular dilatation consistent with central atrophy. Low-attenuation change throughout the deep white matter consistent small vessel ischemia. Small right fronto parietal subdural collection measuring 4 mm maximal depth. Density is consistent with chronic subdural hematoma. No change since prior study. No new extra-axial fluid collections. No  mass-effect or midline shift. Basal cisterns are not effaced. No new acute intracranial hemorrhage. Vascular: Intracranial arterial calcifications. Skull: Calvarium appears intact. Sinuses/Orbits: Paranasal sinuses and mastoid air cells are clear. Other: None. IMPRESSION: 1. No acute intracranial abnormalities. 2. Severe chronic atrophy and small vessel ischemic changes. 3. Small, 4 mm depth, right frontoparietal chronic subdural hematoma is unchanged since prior study. Electronically Signed   By: Lucienne Capers M.D.   On: 07/28/2022 18:30   DG Chest Port 1 View  Result Date: 07/28/2022 CLINICAL DATA:  Sepsis. EXAM: PORTABLE CHEST 1 VIEW COMPARISON:  07/25/2022 FINDINGS: Patient is rotated to the right. Bilateral lower lung airspace disease is new since previous study. Probable small bilateral  effusions also noted. Heart size is within normal limits. Permanent pacemaker remains in place. IMPRESSION: New bilateral lower lung airspace disease and probable small bilateral pleural effusions. Electronically Signed   By: Marlaine Hind M.D.   On: 07/28/2022 17:52      IMPRESSION AND PLAN:  Assessment and Plan: * Aspiration pneumonia ([place]) - The patient will be admitted to an observation medical telemetry bed. - We will continue antibiotic therapy with IV Rocephin and Zithromax been - We will continue hydration with IV normal saline. - His prognosis is fairly guarded. - Will consult with palliative care per the patient's daughter requested.  Acute hypoxic respiratory failure (HCC) - This is clearly secondary to #1. - O2 protocol will be followed.  Acute metabolic encephalopathy - This is clearly secondary to #1 and #2. - We will monitor mental status while he is here. - We will check urinalysis.  Type 2 diabetes mellitus with peripheral neuropathy (HCC) - The patient will be placed on supplemental coverage with NovoLog. - We will continue basal coverage. - We will continue Neurontin. - We will  hold off metformin and continue Jardiance.  Essential hypertension - We will continue his antihypertensives.  Dyslipidemia - We will continue statin therapy.       DVT prophylaxis: Eliquis.  If unable to take p.o. by tomorrow will defer anticoagulation to a.m. hospitalist. Advanced Care Planning:  Code Status: The patient is DNR/DNI. Family Communication:  The plan of care was discussed in details with the patient (and family). I answered all questions. The patient agreed to proceed with the above mentioned plan. Further management will depend upon hospital course. Disposition Plan: Back to previous home environment Consults called: Palliative care consult placed. All the records are reviewed and case discussed with ED provider.  Status is: Observation  I certify that at the time of admission, it is my clinical judgment that the patient will require hospital care extending less than 2 midnights.                            Dispo: The patient is from: SNF              Anticipated d/c is to: SNF              Patient currently is not medically stable to d/c.              Difficult to place patient: No  Christel Mormon M.D on 07/29/2022 at 12:06 AM  [facility]   From 7 PM-7 AM, contact night-coverage [email or web address]  CC: Primary care physician; Albina Billet, MD

## 2022-07-28 NOTE — ED Provider Notes (Signed)
[facility] Provider Note    Event Date/Time   First MD Initiated Contact with Patient 07/28/22 1727     (approximate)   History   Weakness   HPI  Charles Jennings is a 89 y.o. male who presents to the emergency department today because of concerns for decreased responsiveness.  The patient was discharged from the hospital yesterday because of concerns for altered mental status.  Apparently upon arriving to nursing facility he was awake and alert.  Today however he had decreased responsiveness.  At 1 point they required a sternal rub to wake him up.  Patient is unable to give any significant history.     Physical Exam   Triage Vital Signs: ED Triage Vitals  Enc Vitals Group     BP 07/28/22 1652 (!) 150/66     Pulse Rate 07/28/22 1652 91     Resp 07/28/22 1652 17     Temp 07/28/22 1652 98.2 F (36.8 C)     Temp src --      SpO2 07/28/22 1652 100 %     Weight 07/28/22 1653 145 lb 8.1 oz (66 kg)     Height 07/28/22 1653 '5\' 8"'$  (1.727 m)     Head Circumference --      Peak Flow --      Pain Score 07/28/22 1653 0     Pain Loc --      Pain Edu? --      Excl. in [place]? --     Most recent vital signs: Vitals:   07/28/22 1652 07/28/22 1755  BP: (!) 150/66   Pulse: 91   Resp: 17   Temp: 98.2 F (36.8 C)   SpO2: 100% 100%   General: Awakens to hard physical stimuli. CV:  Good peripheral perfusion. Regular rate and rhythm. Resp:  Normal effort. Lungs clear Abd:  No distention.     ED Results / Procedures / Treatments   Labs (all labs ordered are listed, but only abnormal results are displayed) Labs Reviewed  COMPREHENSIVE METABOLIC PANEL - Abnormal; Notable for the following components:      Result Value   Sodium 134 (*)    CO2 20 (*)    Calcium 8.1 (*)    Total Protein 4.7 (*)    Albumin 2.0 (*)    Alkaline Phosphatase 143 (*)    All other components within normal limits  CULTURE, BLOOD (ROUTINE X 2)  CULTURE, BLOOD (ROUTINE X 2)   LACTIC ACID, PLASMA  CBC WITH DIFFERENTIAL/PLATELET  AMMONIA  BLOOD GAS, VENOUS  LACTIC ACID, PLASMA  URINALYSIS, COMPLETE (UACMP) WITH MICROSCOPIC     EKG  I, Nance Pear, attending physician, personally viewed and interpreted this EKG  EKG Time: 1651 Rate: 93 Rhythm: paced rhythm Axis: left axis deviation Intervals: qtc 491 QRS: wide ST changes: no st elevation Impression: abnormal ekg   RADIOLOGY I independently interpreted and visualized the CXR. My interpretation: lower lung airway disease Radiology interpretation:  IMPRESSION:  New bilateral lower lung airspace disease and probable small  bilateral pleural effusions.    I independently interpreted and visualized the CT head. My interpretation: No acute bleed Radiology interpretation:  IMPRESSION:  1. No acute intracranial abnormalities.  2. Severe chronic atrophy and small vessel ischemic changes.  3. Small, 4 mm depth, right frontoparietal chronic subdural hematoma  is unchanged since prior study.     PROCEDURES:  Critical Care performed: No  Procedures   MEDICATIONS ORDERED IN  ED: Medications - No data to display   IMPRESSION / MDM / [place] / ED COURSE  I reviewed the triage vital signs and the nursing notes.                              Differential diagnosis includes, but is not limited to, infection, anemia, electrolyte abnormality  Patient's presentation is most consistent with acute presentation with potential threat to life or bodily function.   The patient is on the cardiac monitor to evaluate for evidence of arrhythmia and/or significant heart rate changes.  Patient presented to the emergency department today because of concerns for decreased responsiveness.  On exam patient is responsive to painful stimuli.  Patient is neither febrile nor hypotensive.  Blood work without concerning leukocytosis or lactic acidosis chest x-ray is concerning for pneumonia.  Will start  antibiotics.  I did have a frank discussion with the patient's daughter.  At this time given the patient's significant decline over the past 2 to 3 weeks she would be interested in having palliative care consulted.     FINAL CLINICAL IMPRESSION(S) / ED DIAGNOSES   Final diagnoses:  Altered mental status, unspecified altered mental status type  Pneumonia due to infectious organism, unspecified laterality, unspecified part of lung        Note:  This document was prepared using Dragon voice recognition software and may include unintentional dictation errors.    Nance Pear, MD 07/28/22 2040

## 2022-07-28 NOTE — ED Notes (Signed)
Per provider, Eugenie Norrie, via secure chat, hold oral medications until pt is alert enough to take them

## 2022-07-28 NOTE — ED Triage Notes (Signed)
Patient presents to ER via EMT from [facility] for weakness and decreased LOC. Patient has had a decline in health over the past 2 weeks. Family would like him evaluated for UTI before making a decision to put him on hospice. Patient is not responding to verbal or physical stimuli.

## 2022-07-28 NOTE — H&P (Incomplete)
[place]   PATIENT NAME: Charles Jennings    MR#:  [id number]  DATE OF BIRTH:  05/16/1928  DATE OF ADMISSION:  07/28/2022  PRIMARY CARE PHYSICIAN: Albina Billet, MD   Patient is coming from: Home  REQUESTING/REFERRING PHYSICIAN: Nance Pear, MD  CHIEF COMPLAINT:   Chief Complaint  Patient presents with  . Weakness  Altered mental status  HISTORY OF PRESENT ILLNESS:  Charles Jennings is a 89 y.o. male with medical history significant for hypertension, dyslipidemia, peripheral vascular disease, type 2 diabetes mellitus, HFpEF who was just discharged yesterday for management of severe sepsis with multiorgan involvement and bacteremia and community-acquired pneumonia and hypoglycemia with AKI.  The patient was discharged to SNF and was noted to be unresponsive today requiring sternal rub to wake him up.  He was unable to give any history.  No reported fever or chills.  No reported nausea or vomiting.  No reported chest pain or palpitations.  No reported worsening cough or wheezing or dyspnea. . ED Course: When he came to the ER, BP was 150/66 with pulse currently of 100% on 3 L of O2 by nasal cannula, heart rate of 91 and later 93 and otherwise normal vital signs.  Labs revealed mild hyponatremia 134 and a CO2 of 20 with calcium of 8.1, alk phos 143 and albumin 2 total protein of 4.7.  Ammonia level was 12.  VBG showed pH 7.34 and bicarbonate of 24.3.  Lactic acid was 1.2 and later 1.1.  CBC was within normal.  Blood glucose was 84.  Blood cultures were drawn. EKG as reviewed by me : Paced rhythm with a rate of 93 Imaging: Noncontrast head CT scan revealed no acute intracranial abnormality.  It showed severe chronic atrophy and small vessel ischemic changes with small 4 mm deep right frontoparietal chronic subdural hematoma that is unchanged from prior study.  The patient was given IV vancomycin and cefepime.  He will be admitted to a medical telemetry bed for further  evaluation and management.   PAST MEDICAL HISTORY:   Past Medical History:  Diagnosis Date  . (HFpEF) heart failure with preserved ejection fraction ([place])   . Arthritis   . Complete heart block ([place])    a.) s/p dual chamber Medtronic PPM placement 01/31/2016  . Frequent PVCs   . Hemorrhoids    a.) s/p banding 01/2021  . HLD (hyperlipidemia)   . Hypertension   . Long term current use of antithrombotics/antiplatelets    a.) on DAPT (ASA + clopidogrel)  . Peripheral vascular disease ([place])   . Presence of permanent cardiac pacemaker 01/31/2016   a.) s/p Advisa DR MRI SureScan dual chamber PPM (SN: [id number])  . SDH (subdural hematoma) ([place])   . Skin cancer   . Type 2 diabetes mellitus treated with insulin ([place])     PAST SURGICAL HISTORY:   Past Surgical History:  Procedure Laterality Date  . AMPUTATION TOE Left 06/30/2022   Procedure: AMPUTATION TOE - [place];  Surgeon: Sharlotte Alamo, DPM;  Location: [facility];  Service: Podiatry;  Laterality: Left;  . AMPUTATION TOE Left 07/16/2022   Procedure: AMPUTATION TOE;  Surgeon: Samara Deist, DPM;  Location: [facility];  Service: Podiatry;  Laterality: Left;  . EYE SURGERY    . FEMORAL ENDARTERECTOMY Bilateral   . HEMORRHOID BANDING N/A 01/2021  . HERNIA REPAIR     double hernia  . LOWER EXTREMITY ANGIOGRAPHY Left 06/12/2022   Procedure: Lower Extremity  Angiography;  Surgeon: Algernon Huxley, MD;  Location: [place] [facility];  Service: Cardiovascular;  Laterality: Left;  . LOWER EXTREMITY ANGIOGRAPHY Left 07/13/2022   Procedure: Lower Extremity Angiography;  Surgeon: Algernon Huxley, MD;  Location: [place] [facility];  Service: Cardiovascular;  Laterality: Left;  . LOWER EXTREMITY ANGIOGRAPHY Left 07/14/2022   Procedure: Lower Extremity Angiography;  Surgeon: Algernon Huxley, MD;  Location: [place] [facility];  Service: Cardiovascular;  Laterality: Left;  . LOWER EXTREMITY INTERVENTION Bilateral 06/14/2022   Procedure:  LOWER EXTREMITY INTERVENTION;  Surgeon: Algernon Huxley, MD;  Location: [place] [facility];  Service: Cardiovascular;  Laterality: Bilateral;  . PACEMAKER INSERTION Left 01/31/2016   Procedure: PACEMAKER INSERTION; Location: [facility]; Surgeon: Dimas Alexandria, MD    SOCIAL HISTORY:   Social History   Tobacco Use  . Smoking status: Never  . Smokeless tobacco: Never  Substance Use Topics  . Alcohol use: Not Currently    FAMILY HISTORY:   Family History  Problem Relation Age of Onset  . Liver disease Father   . Hypertension Brother   . Heart attack Brother   . Alpha-1 antitrypsin deficiency Granddaughter     DRUG ALLERGIES:  No Known Allergies  REVIEW OF SYSTEMS:   ROS As per history of present illness. All pertinent systems were reviewed above. Constitutional, HEENT, cardiovascular, respiratory, GI, GU, musculoskeletal, neuro, psychiatric, endocrine, integumentary and hematologic systems were reviewed and are otherwise negative/unremarkable except for positive findings mentioned above in the HPI.   MEDICATIONS AT HOME:   Prior to Admission medications   Medication Sig Start Date End Date Taking? Authorizing Provider  acetaminophen (TYLENOL) 325 MG tablet Take 2 tablets (650 mg total) by mouth every 6 (six) hours as needed. 07/21/22 07/21/23  Val Riles, MD  Amino Acids-Protein Hydrolys (PRO-STAT [facility]) LIQD Take by mouth. 07/24/22   [provider]  apixaban (ELIQUIS) 2.5 MG TABS tablet Take 1 tablet (2.5 mg total) by mouth 2 (two) times daily. 07/21/22 07/21/23  Val Riles, MD  aspirin EC 81 MG tablet Take 1 tablet (81 mg total) by mouth daily. Swallow whole. 07/22/22   Val Riles, MD  atorvastatin (LIPITOR) 40 MG tablet Take 1 tablet (40 mg total) by mouth every evening. 06/15/22 07/25/22  Richarda Osmond, MD  diltiazem (CARDIZEM CD) 120 MG 24 hr capsule Take 1 capsule (120 mg total) by mouth daily. 07/27/22   Emeterio Reeve, DO  doxycycline (VIBRA-TABS) 100 MG  tablet Take 1 tablet (100 mg total) by mouth every 12 (twelve) hours for 14 days. 07/21/22 08/04/22  Val Riles, MD  JARDIANCE 10 MG TABS tablet Take 10 mg by mouth daily.    [provider]  levofloxacin (LEVAQUIN) 500 MG tablet Take 1 tablet (500 mg total) by mouth at bedtime for 14 days. 07/21/22 08/04/22  Val Riles, MD  loratadine (CLARITIN) 10 MG tablet Take 1 tablet by mouth daily. 11/30/15   [provider]  magnesium oxide (MAG-OX) 400 MG tablet Take 1 tablet (400 mg total) by mouth daily for 14 days. 07/21/22 08/04/22  Val Riles, MD  metFORMIN (GLUCOPHAGE) 500 MG tablet Take 500 mg by mouth 2 (two) times daily with a meal. 11/30/15   [provider]  NOVOLOG MIX 70/30 FLEXPEN (70-30) 100 UNIT/ML FlexPen Inject 8 Units into the skin daily with breakfast. Hold if less than 150 07/27/22   Emeterio Reeve, DO      VITAL SIGNS:  Blood pressure (!) 150/66, pulse 91, temperature  98.2 F (36.8 C), resp. rate 17, height '5\' 8"'$  (1.727 m), weight 66 kg, SpO2 100 %.  PHYSICAL EXAMINATION:  Physical Exam  GENERAL:  89 y.o.-year-old Caucasian male patient lying in the bed with no acute distress.  He was unresponsive except to painful stimuli blinking his eyes. EYES: Pupils equal, round, reactive to light and accommodation. No scleral icterus. Extraocular muscles intact.  HEENT: Head atraumatic, normocephalic. Oropharynx and nasopharynx clear.  NECK:  Supple, no jugular venous distention. No thyroid enlargement, no tenderness.  LUNGS: Diminished bibasal breath sounds with bibasal rales.. No use of accessory muscles of respiration.  CARDIOVASCULAR: Regular rate and rhythm, S1, S2 normal. No murmurs, rubs, or gallops.  ABDOMEN: Soft, nondistended, nontender. Bowel sounds present. No organomegaly or mass.  EXTREMITIES: No pedal edema, cyanosis, or clubbing.  NEUROLOGIC: No lateralizing signs.  He is very somnolent to cope rate with exam. PSYCHIATRIC: The patient is  unresponsive except to painful stimuli. SKIN: No obvious rash, lesion, or ulcer.   LABORATORY PANEL:   CBC Recent Labs  Lab 07/28/22 1701  WBC 7.5  HGB 13.3  HCT 41.5  PLT 184   ------------------------------------------------------------------------------------------------------------------  Chemistries  Recent Labs  Lab 07/26/22 0338 07/27/22 0510 07/28/22 1701  NA 137   < > 134*  K 4.0   < > 3.8  CL 104   < > 99  CO2 22   < > 20*  GLUCOSE 92   < > 84  BUN 17   < > 12  CREATININE 1.06   < > 0.89  CALCIUM 7.8*   < > 8.1*  MG 1.5*  --   --   AST  --   --  31  ALT  --   --  24  ALKPHOS  --   --  143*  BILITOT  --   --  1.2   < > = values in this interval not displayed.   ------------------------------------------------------------------------------------------------------------------  Cardiac Enzymes No results for input(s): "TROPONINI" in the last 168 hours. ------------------------------------------------------------------------------------------------------------------  RADIOLOGY:  CT Head Wo Contrast  Result Date: 07/28/2022 CLINICAL DATA:  Altered mental status.  Nonresponsive. EXAM: CT HEAD WITHOUT CONTRAST TECHNIQUE: Contiguous axial images were obtained from the base of the skull through the vertex without intravenous contrast. RADIATION DOSE REDUCTION: This exam was performed according to the departmental dose-optimization program which includes automated exposure control, adjustment of the mA and/or kV according to patient size and/or use of iterative reconstruction technique. COMPARISON:  07/25/2022 FINDINGS: Brain: Diffuse severe cortical atrophy. Ventricular dilatation consistent with central atrophy. Low-attenuation change throughout the deep white matter consistent small vessel ischemia. Small right fronto parietal subdural collection measuring 4 mm maximal depth. Density is consistent with chronic subdural hematoma. No change since prior study. No new  extra-axial fluid collections. No mass-effect or midline shift. Basal cisterns are not effaced. No new acute intracranial hemorrhage. Vascular: Intracranial arterial calcifications. Skull: Calvarium appears intact. Sinuses/Orbits: Paranasal sinuses and mastoid air cells are clear. Other: None. IMPRESSION: 1. No acute intracranial abnormalities. 2. Severe chronic atrophy and small vessel ischemic changes. 3. Small, 4 mm depth, right frontoparietal chronic subdural hematoma is unchanged since prior study. Electronically Signed   By: Lucienne Capers M.D.   On: 07/28/2022 18:30   DG Chest Port 1 View  Result Date: 07/28/2022 CLINICAL DATA:  Sepsis. EXAM: PORTABLE CHEST 1 VIEW COMPARISON:  07/25/2022 FINDINGS: Patient is rotated to the right. Bilateral lower lung airspace disease is new since previous study. Probable small bilateral  effusions also noted. Heart size is within normal limits. Permanent pacemaker remains in place. IMPRESSION: New bilateral lower lung airspace disease and probable small bilateral pleural effusions. Electronically Signed   By: Marlaine Hind M.D.   On: 07/28/2022 17:52      IMPRESSION AND PLAN:  Assessment and Plan: No notes have been filed under this hospital service. Service: Hospitalist      DVT prophylaxis: Lovenox***  Advanced Care Planning:  Code Status: full code***  Family Communication:  The plan of care was discussed in details with the patient (and family). I answered all questions. The patient agreed to proceed with the above mentioned plan. Further management will depend upon hospital course. Disposition Plan: Back to previous home environment Consults called: none***  All the records are reviewed and case discussed with ED provider.  Status is: Observation {Observation:23811}   At the time of the admission, it appears that the appropriate admission status for this patient is inpatient.  This is judged to be reasonable and necessary in order to provide  the required intensity of service to ensure the patient's safety given the presenting symptoms, physical exam findings and initial radiographic and laboratory data in the context of comorbid conditions.  The patient requires inpatient status due to high intensity of service, high risk of further deterioration and high frequency of surveillance required.  I certify that at the time of admission, it is my clinical judgment that the patient will require inpatient hospital care extending more than 2 midnights.                            Dispo: The patient is from: Home              Anticipated d/c is to: Home              Patient currently is not medically stable to d/c.              Difficult to place patient: No  Christel Mormon M.D on 07/28/2022 at 8:47 PM  [facility]   From 7 PM-7 AM, contact night-coverage [email or web address]  CC: Primary care physician; Albina Billet, MD

## 2022-07-29 ENCOUNTER — Other Ambulatory Visit: Payer: Self-pay

## 2022-07-29 ENCOUNTER — Encounter: Payer: Self-pay | Admitting: Family Medicine

## 2022-07-29 DIAGNOSIS — E1142 Type 2 diabetes mellitus with diabetic polyneuropathy: Secondary | ICD-10-CM

## 2022-07-29 DIAGNOSIS — F22 Delusional disorders: Secondary | ICD-10-CM | POA: Diagnosis present

## 2022-07-29 DIAGNOSIS — N1831 Chronic kidney disease, stage 3a: Secondary | ICD-10-CM | POA: Diagnosis present

## 2022-07-29 DIAGNOSIS — E785 Hyperlipidemia, unspecified: Secondary | ICD-10-CM | POA: Insufficient documentation

## 2022-07-29 DIAGNOSIS — I10 Essential (primary) hypertension: Secondary | ICD-10-CM | POA: Insufficient documentation

## 2022-07-29 DIAGNOSIS — E11621 Type 2 diabetes mellitus with foot ulcer: Secondary | ICD-10-CM | POA: Diagnosis present

## 2022-07-29 DIAGNOSIS — J9601 Acute respiratory failure with hypoxia: Secondary | ICD-10-CM | POA: Diagnosis present

## 2022-07-29 DIAGNOSIS — R4182 Altered mental status, unspecified: Secondary | ICD-10-CM | POA: Diagnosis present

## 2022-07-29 DIAGNOSIS — Z794 Long term (current) use of insulin: Secondary | ICD-10-CM | POA: Diagnosis not present

## 2022-07-29 DIAGNOSIS — J690 Pneumonitis due to inhalation of food and vomit: Secondary | ICD-10-CM | POA: Diagnosis not present

## 2022-07-29 DIAGNOSIS — Z515 Encounter for palliative care: Secondary | ICD-10-CM | POA: Diagnosis not present

## 2022-07-29 DIAGNOSIS — Z8619 Personal history of other infectious and parasitic diseases: Secondary | ICD-10-CM | POA: Diagnosis not present

## 2022-07-29 DIAGNOSIS — J189 Pneumonia, unspecified organism: Secondary | ICD-10-CM | POA: Diagnosis not present

## 2022-07-29 DIAGNOSIS — L89626 Pressure-induced deep tissue damage of left heel: Secondary | ICD-10-CM | POA: Diagnosis present

## 2022-07-29 DIAGNOSIS — I6203 Nontraumatic chronic subdural hemorrhage: Secondary | ICD-10-CM | POA: Diagnosis present

## 2022-07-29 DIAGNOSIS — L89311 Pressure ulcer of right buttock, stage 1: Secondary | ICD-10-CM | POA: Diagnosis present

## 2022-07-29 DIAGNOSIS — I130 Hypertensive heart and chronic kidney disease with heart failure and stage 1 through stage 4 chronic kidney disease, or unspecified chronic kidney disease: Secondary | ICD-10-CM | POA: Diagnosis present

## 2022-07-29 DIAGNOSIS — Z7189 Other specified counseling: Secondary | ICD-10-CM | POA: Diagnosis not present

## 2022-07-29 DIAGNOSIS — E871 Hypo-osmolality and hyponatremia: Secondary | ICD-10-CM | POA: Diagnosis present

## 2022-07-29 DIAGNOSIS — I442 Atrioventricular block, complete: Secondary | ICD-10-CM | POA: Diagnosis present

## 2022-07-29 DIAGNOSIS — Z85828 Personal history of other malignant neoplasm of skin: Secondary | ICD-10-CM | POA: Diagnosis not present

## 2022-07-29 DIAGNOSIS — L97529 Non-pressure chronic ulcer of other part of left foot with unspecified severity: Secondary | ICD-10-CM | POA: Diagnosis present

## 2022-07-29 DIAGNOSIS — G9341 Metabolic encephalopathy: Secondary | ICD-10-CM | POA: Diagnosis present

## 2022-07-29 DIAGNOSIS — J90 Pleural effusion, not elsewhere classified: Secondary | ICD-10-CM | POA: Diagnosis present

## 2022-07-29 DIAGNOSIS — I5032 Chronic diastolic (congestive) heart failure: Secondary | ICD-10-CM | POA: Diagnosis present

## 2022-07-29 DIAGNOSIS — E1152 Type 2 diabetes mellitus with diabetic peripheral angiopathy with gangrene: Secondary | ICD-10-CM | POA: Diagnosis present

## 2022-07-29 DIAGNOSIS — Z66 Do not resuscitate: Secondary | ICD-10-CM | POA: Diagnosis present

## 2022-07-29 DIAGNOSIS — E1122 Type 2 diabetes mellitus with diabetic chronic kidney disease: Secondary | ICD-10-CM | POA: Diagnosis present

## 2022-07-29 LAB — CBC
HCT: 35.1 % — ABNORMAL LOW (ref 39.0–52.0)
Hemoglobin: 11.5 g/dL — ABNORMAL LOW (ref 13.0–17.0)
MCH: 29 pg (ref 26.0–34.0)
MCHC: 32.8 g/dL (ref 30.0–36.0)
MCV: 88.6 fL (ref 80.0–100.0)
Platelets: 153 10*3/uL (ref 150–400)
RBC: 3.96 MIL/uL — ABNORMAL LOW (ref 4.22–5.81)
RDW: 13.4 % (ref 11.5–15.5)
WBC: 5.9 10*3/uL (ref 4.0–10.5)
nRBC: 0 % (ref 0.0–0.2)

## 2022-07-29 LAB — BASIC METABOLIC PANEL
Anion gap: 15 (ref 5–15)
BUN: 12 mg/dL (ref 8–23)
CO2: 19 mmol/L — ABNORMAL LOW (ref 22–32)
Calcium: 7.7 mg/dL — ABNORMAL LOW (ref 8.9–10.3)
Chloride: 100 mmol/L (ref 98–111)
Creatinine, Ser: 0.77 mg/dL (ref 0.61–1.24)
GFR, Estimated: >60 mL/min (ref >60)
Glucose, Bld: 87 mg/dL (ref 70–99)
Potassium: 3.6 mmol/L (ref 3.5–5.1)
Sodium: 134 mmol/L — ABNORMAL LOW (ref 135–145)

## 2022-07-29 LAB — GLUCOSE, CAPILLARY
Glucose-Capillary: 89 mg/dL (ref 70–99)
Glucose-Capillary: 113 mg/dL — ABNORMAL HIGH (ref 70–99)
Glucose-Capillary: 96 mg/dL (ref 70–99)
Glucose-Capillary: 175 mg/dL — ABNORMAL HIGH (ref 70–99)
Glucose-Capillary: 206 mg/dL — ABNORMAL HIGH (ref 70–99)

## 2022-07-29 MED ORDER — PIPERACILLIN-TAZOBACTAM 3.375 G IVPB
3.3750 g | Freq: Three times a day (TID) | INTRAVENOUS | Status: DC
  Administered 2022-07-29 – 2022-07-31 (×7): 3.375 g via INTRAVENOUS
  Filled 2022-07-29 (×6): qty 50

## 2022-07-29 NOTE — Assessment & Plan Note (Signed)
-   We will continue his antihypertensives. 

## 2022-07-29 NOTE — Assessment & Plan Note (Signed)
-   This is clearly secondary to #1. - O2 protocol will be followed.

## 2022-07-29 NOTE — Progress Notes (Signed)
PROGRESS NOTE    Charles Jennings  [id number] DOB: 05/04/1928 DOA: 07/28/2022 PCP: Albina Billet, MD   Brief Narrative: 89 year old with past medical history significant for hypertension, dyslipidemia, peripheral vascular disease, type 2 diabetes, heart failure preserved ejection fraction who presents with altered mental status recently discharged the day prior to admission, treated at that time for sepsis, bacteremia, community-acquired pneumonia, hypoglycemia and AKI.  Patient was noted to be unresponsive at a skilled nursing facility, he required sternal rub to wake him up.  Patient in the ED he continued to be hypoxic requiring 3 L of oxygen, ammonia level 12, venous gas pH 7.3, lactic acid 1.2, CBG 84.  CT revealed no acute intracranial abnormality.  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     Assessment & Plan:   Principal Problem:   Aspiration pneumonia ([place]) Active Problems:   Acute hypoxic respiratory failure (HCC)   Acute metabolic encephalopathy   Type 2 diabetes mellitus with peripheral neuropathy (HCC)   Dyslipidemia   Essential hypertension   1-Acute metabolic encephalopathy: -Patient presented with altered mental status, lethargic, only responded to sternal rub.  -No hypoglycemia on admission, no new medications.  Ammonia normal, pCO2 normal on venous gas -Differential could be related to infection, aspiration pneumonia, delirium due to recurrent illness, hypoxia. -Will repeat CT head tomorrow to rule out a stroke unable to have MRI due to pacemaker -Will proceed with EEG -Today he is alert and conversant and answering questions.  Daughter reporter history of hallucination at rehab   2-Aspiration pneumonia: Chest x ray: New bilateral lower lung airspace disease and probable small bilateral pleural effusions. -Will continue with management for pneumonia, continue IV  Zosyn.  Will avoid cefepime to avoid worsening delirium. -on IV Azithro  Acute hypoxic respiratory failure: Patient required 3 L of oxygen to keep oxygen saturation above 100 Continue treatment for aspiration pneumonia   Diabetes type 2, peripheral neuropathy: -Will discontinue 7030.  Will avoid discharging on the 7030 due to increased risk for hypoglycemia. -Monitor CBG and depending on insulin requirement could consider low-dose Levemir or Lantus   CKD IIIa: Renal function stable.  Continue to monitor  Hypertension: Continue with Cardizem Will hold lisinopril for now  Dyslipidemia: Continue with Lipitor  Mild hyponatremia: Monitor.   Left Foot wound:  History Osteomyelitis of the left third toe: Underwent amputation left second toe MTP P and excisional debridement necrotic tissue and abscess and including tendon and muscle dorsal left foot.  PVD, s/p angioplasty with stent placement of left lower extremity 06/2022 07/2022 repeated angiogram with mechanical thrombectomy of left SFA popliteal artery and tibioperoneal trunk.  Status post tPA at the time of the procedure.  Stent placement left distal as a and above-knee popliteal artery with 6 mm diameter by 15 cm stent for residual thrombus and stenosis after thrombectomy. -On eliquis   Goals of Care;  This is patient's fourth hospitalization since the beginning of this year.  Discussed with Daughter, plan for palliative care consult, goals of care, she would like to avoid rehospitalization, to help with Quality of life.    Pressure Injury 07/25/22 Buttocks Right Stage 1 -  Intact skin with non-blanchable redness of a localized area usually over a bony prominence. red, non blanchable (Active)  07/25/22 1759  Location: Buttocks  Location Orientation: Right  Staging: Stage 1 -  Intact skin with non-blanchable redness of a localized area usually over  a bony prominence.  Wound Description (Comments): red, non blanchable  Present  on Admission: Yes  Dressing Type Foam - Lift dressing to assess site every shift 07/28/22 2245     Pressure Injury 07/26/22 Heel Left Deep Tissue Pressure Injury - Purple or maroon localized area of discolored intact skin or blood-filled blister due to damage of underlying soft tissue from pressure and/or shear. (Active)  07/26/22 0921  Location: Heel  Location Orientation: Left  Staging: Deep Tissue Pressure Injury - Purple or maroon localized area of discolored intact skin or blood-filled blister due to damage of underlying soft tissue from pressure and/or shear.  Wound Description (Comments):   Present on Admission: Yes  Dressing Type Other (Comment) 07/28/22 2245    Estimated body mass index is 22.12 kg/m as calculated from the following:   Height as of this encounter: '5\' 8"'$  (1.727 m).   Weight as of this encounter: 66 kg.   DVT prophylaxis: Eliquis Code Status: DNR Family Communication: Daughter who was at bedside.  Disposition Plan:  Status is: Observation The patient remains OBS appropriate and will d/c before 2 midnights.    Consultants:  Palliative care    Antimicrobials:    Subjective: He is alert, confuse, answer questions.  He keep touching IV and moving hands.  Daughter report episodes of hallucination at rehab,   Objective: Vitals:   07/28/22 2229 07/28/22 2330 07/29/22 0107 07/29/22 0558  BP: (!) 140/62 (!) 142/60 (!) 150/70 (!) 153/69  Pulse: 78 75 76 73  Resp: '12 12 16 18  '$ Temp: 97.8 F (36.6 C) 97.9 F (36.6 C) (!) 97.5 F (36.4 C) 98.5 F (36.9 C)  TempSrc: Axillary     SpO2: 100% 100% 100% 99%  Weight:      Height:       No intake or output data in the 24 hours ending 07/29/22 0746 Filed Weights   07/28/22 1653  Weight: 66 kg    Examination:  General exam: Appears calm and comfortable  Respiratory system: BL ronchus.  Cardiovascular system: S1 & S2 heard, RRR.  Gastrointestinal system: Abdomen is nondistended, soft and nontender.   Central nervous system: Alert Follows command     Data Reviewed: I have personally reviewed following labs and imaging studies  CBC: Recent Labs  Lab 07/25/22 1050 07/26/22 0338 07/27/22 0510 07/28/22 1701 07/29/22 0431  WBC 10.0 7.3 8.5 7.5 5.9  NEUTROABS 7.1  --   --  4.7  --   HGB 13.3 12.0* 11.7* 13.3 11.5*  HCT 41.0 37.2* 35.4* 41.5 35.1*  MCV 88.9 88.4 87.6 91.4 88.6  PLT 319 232 226 184 0000000   Basic Metabolic Panel: Recent Labs  Lab 07/25/22 1050 07/26/22 0338 07/27/22 0510 07/28/22 1701 07/29/22 0431  NA 135 137 136 134* 134*  K 3.8 4.0 3.9 3.8 3.6  CL 100 104 103 99 100  CO2 '23 22 23 '$ 20* 19*  GLUCOSE 91 92 62* 84 87  BUN '20 17 17 12 12  '$ CREATININE 1.29* 1.06 0.94 0.89 0.77  CALCIUM 8.3* 7.8* 8.0* 8.1* 7.7*  MG  --  1.5*  --   --   --   PHOS  --  3.3  --   --   --    GFR: Estimated Creatinine Clearance: 55 mL/min (by C-G formula based on SCr of 0.77 mg/dL). Liver Function Tests: Recent Labs  Lab 07/25/22 1050 07/28/22 1701  AST 42* 31  ALT 27 24  ALKPHOS 173* 143*  BILITOT  1.3* 1.2  PROT 5.3* 4.7*  ALBUMIN 2.4* 2.0*   No results for input(s): "LIPASE", "AMYLASE" in the last 168 hours. Recent Labs  Lab 07/28/22 1836  AMMONIA 12   Coagulation Profile: No results for input(s): "INR", "PROTIME" in the last 168 hours. Cardiac Enzymes: No results for input(s): "CKTOTAL", "CKMB", "CKMBINDEX", "TROPONINI" in the last 168 hours. BNP (last 3 results) No results for input(s): "PROBNP" in the last 8760 hours. HbA1C: No results for input(s): "HGBA1C" in the last 72 hours. CBG: Recent Labs  Lab 07/27/22 1132 07/27/22 1653 07/28/22 2241 07/29/22 0344 07/29/22 0732  GLUCAP 133* 118* 108* 89 113*   Lipid Profile: No results for input(s): "CHOL", "HDL", "LDLCALC", "TRIG", "CHOLHDL", "LDLDIRECT" in the last 72 hours. Thyroid Function Tests: No results for input(s): "TSH", "T4TOTAL", "FREET4", "T3FREE", "THYROIDAB" in the last 72 hours. Anemia  Panel: No results for input(s): "VITAMINB12", "FOLATE", "FERRITIN", "TIBC", "IRON", "RETICCTPCT" in the last 72 hours. Sepsis Labs: Recent Labs  Lab 07/25/22 1050 07/25/22 1316 07/26/22 0338 07/27/22 0510 07/28/22 1701 07/28/22 2238  PROCALCITON <0.10  --  <0.10 <0.10  --   --   LATICACIDVEN 1.5 1.8  --   --  1.2 1.1    Recent Results (from the past 240 hour(s))  Blood culture (routine x 2)     Status: None (Preliminary result)   Collection Time: 07/25/22 11:40 AM   Specimen: BLOOD RIGHT HAND  Result Value Ref Range Status   Specimen Description BLOOD RIGHT HAND  Final   Special Requests   Final    BOTTLES DRAWN AEROBIC AND ANAEROBIC Blood Culture adequate volume   Culture   Final    NO GROWTH 4 DAYS Performed at [facility] [address], [place], [place] [address]    Report Status PENDING  Incomplete  Blood culture (routine x 2)     Status: None (Preliminary result)   Collection Time: 07/25/22  1:16 PM   Specimen: BLOOD  Result Value Ref Range Status   Specimen Description BLOOD BLOOD RIGHT HAND  Final   Special Requests   Final    BOTTLES DRAWN AEROBIC AND ANAEROBIC Blood Culture adequate volume   Culture   Final    NO GROWTH 4 DAYS Performed at [facility], [address] [place], [place] [address]    Report Status PENDING  Incomplete  Blood Culture (routine x 2)     Status: None (Preliminary result)   Collection Time: 07/28/22  5:01 PM   Specimen: BLOOD  Result Value Ref Range Status   Specimen Description BLOOD LEFT ANTECUBITAL  Final   Special Requests   Final    BOTTLES DRAWN AEROBIC AND ANAEROBIC Blood Culture adequate volume   Culture   Final    NO GROWTH < 24 HOURS Performed at [facility], [address] [place], [place] [address]    Report Status PENDING  Incomplete  Blood Culture (routine x 2)     Status: None (Preliminary result)   Collection Time: 07/28/22  6:35 PM   Specimen: BLOOD  Result Value Ref Range  Status   Specimen Description BLOOD BLOOD LEFT WRIST  Final   Special Requests   Final    BOTTLES DRAWN AEROBIC AND ANAEROBIC Blood Culture adequate volume   Culture   Final    NO GROWTH < 12 HOURS Performed at [facility], [address] [place], [place] [address]    Report Status PENDING  Incomplete  Radiology Studies: CT Head Wo Contrast  Result Date: 07/28/2022 CLINICAL DATA:  Altered mental status.  Nonresponsive. EXAM: CT HEAD WITHOUT CONTRAST TECHNIQUE: Contiguous axial images were obtained from the base of the skull through the vertex without intravenous contrast. RADIATION DOSE REDUCTION: This exam was performed according to the departmental dose-optimization program which includes automated exposure control, adjustment of the mA and/or kV according to patient size and/or use of iterative reconstruction technique. COMPARISON:  07/25/2022 FINDINGS: Brain: Diffuse severe cortical atrophy. Ventricular dilatation consistent with central atrophy. Low-attenuation change throughout the deep white matter consistent small vessel ischemia. Small right fronto parietal subdural collection measuring 4 mm maximal depth. Density is consistent with chronic subdural hematoma. No change since prior study. No new extra-axial fluid collections. No mass-effect or midline shift. Basal cisterns are not effaced. No new acute intracranial hemorrhage. Vascular: Intracranial arterial calcifications. Skull: Calvarium appears intact. Sinuses/Orbits: Paranasal sinuses and mastoid air cells are clear. Other: None. IMPRESSION: 1. No acute intracranial abnormalities. 2. Severe chronic atrophy and small vessel ischemic changes. 3. Small, 4 mm depth, right frontoparietal chronic subdural hematoma is unchanged since prior study. Electronically Signed   By: Lucienne Capers M.D.   On: 07/28/2022 18:30   DG Chest Port 1 View  Result Date: 07/28/2022 CLINICAL DATA:  Sepsis. EXAM: PORTABLE CHEST 1 VIEW  COMPARISON:  07/25/2022 FINDINGS: Patient is rotated to the right. Bilateral lower lung airspace disease is new since previous study. Probable small bilateral effusions also noted. Heart size is within normal limits. Permanent pacemaker remains in place. IMPRESSION: New bilateral lower lung airspace disease and probable small bilateral pleural effusions. Electronically Signed   By: Marlaine Hind M.D.   On: 07/28/2022 17:52        Scheduled Meds:  apixaban  2.5 mg Oral BID   aspirin EC  81 mg Oral Daily   atorvastatin  40 mg Oral QPM   diltiazem  120 mg Oral Daily   empagliflozin  10 mg Oral Daily   insulin aspart  0-5 Units Subcutaneous QHS   insulin aspart  0-9 Units Subcutaneous TID WC   insulin aspart protamine- aspart  8 Units Subcutaneous Q breakfast   magnesium oxide  400 mg Oral Daily   Continuous Infusions:  sodium chloride 100 mL/hr at 07/28/22 2305   azithromycin 500 mg (07/28/22 2308)   piperacillin-tazobactam (ZOSYN)  IV 3.375 g (07/29/22 0536)     LOS: 0 days    Time spent: 35 minutes    Michole Lecuyer A Makenley Shimp, MD [facility]   If 7PM-7AM, please contact night-coverage [email or web address]  07/29/2022, 7:46 AM

## 2022-07-29 NOTE — Plan of Care (Signed)
  Problem: Coping: Goal: Ability to adjust to condition or change in health will improve Outcome: Progressing   Problem: Fluid Volume: Goal: Ability to maintain a balanced intake and output will improve Outcome: Progressing   Problem: Health Behavior/Discharge Planning: Goal: Ability to identify and utilize available resources and services will improve Outcome: Progressing   Problem: Skin Integrity: Goal: Risk for impaired skin integrity will decrease Outcome: Progressing   Problem: Education: Goal: Knowledge of General Education information will improve Description: Including pain rating scale, medication(s)/side effects and non-pharmacologic comfort measures Outcome: Progressing   Problem: Clinical Measurements: Goal: Ability to maintain clinical measurements within normal limits will improve Outcome: Progressing   Problem: Coping: Goal: Level of anxiety will decrease Outcome: Progressing

## 2022-07-29 NOTE — Assessment & Plan Note (Signed)
-   The patient will be placed on supplemental coverage with NovoLog. - We will continue basal coverage. - We will continue Neurontin. - We will hold off metformin and continue Jardiance.

## 2022-07-29 NOTE — NC FL2 (Signed)
[place] LEVEL OF CARE FORM     IDENTIFICATION  Patient Name: Charles Jennings Birthdate: March 21, 1928 Sex: male Admission Date (Current Location): 07/28/2022  [facility] and [place] Number:  Engineering geologist and Address:  [facility], [address], [place], [place] [address]      Provider Number: [id number]  Attending Physician Name and Address:  Elmarie Shiley, MD  Relative Name and Phone Number:  Laural Benes [phone]    Current Level of Care: Hospital Recommended Level of Care: [place] Prior Approval Number:    Date Approved/Denied:   PASRR Number: [id number]  Discharge Plan: SNF    Current Diagnoses: Patient Active Problem List   Diagnosis Date Noted   Acute metabolic encephalopathy [id number]   Dyslipidemia 07/29/2022   Type 2 diabetes mellitus with peripheral neuropathy ([place]) 07/29/2022   Essential hypertension 07/29/2022   Aspiration pneumonia ([place]) 07/28/2022   Altered mental status 07/25/2022   Hypothermia 07/25/2022   Acute hypoxic respiratory failure ([place]) 07/25/2022   AKI (acute kidney injury) ([place]) 07/25/2022   History of cardiac pacemaker 07/25/2022   Pressure injury of skin 07/25/2022   Left rib fracture 07/25/2022   History of MRSA infection 07/25/2022   PAD (peripheral artery disease) ([place]) 07/20/2022   Paranoia ([place]) 07/17/2022   Gangrene of toe ([place]) 07/15/2022   Osteomyelitis ([place]) 07/12/2022   Stage 3a chronic kidney disease (CKD) ([place]) 07/12/2022   Weakness 07/12/2022   Critical limb ischemia of left lower extremity with ulceration of foot ([place]) 06/15/2022   Nausea and vomiting 06/13/2022   Critical limb ischemia of left lower extremity ([place]) 06/12/2022   Limb ischemia 06/11/2022   CKD (chronic kidney disease) 06/11/2022   Chronic diastolic CHF (congestive heart failure), NYHA class 2 ([place]) 03/07/2018   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/2016   Benign essential HTN 02/11/2016    Complete heart block (HCC) 02/11/2016   Mixed hyperlipidemia 02/11/2016   Insulin dependent type 2 diabetes mellitus ([place]) 01/28/2016   Subdural hemorrhage ([place]) 01/28/2016    Orientation RESPIRATION BLADDER Height & Weight     Self, Place  Normal Incontinent Weight: 66 kg Height:  '5\' 8"'$  (172.7 cm)  BEHAVIORAL SYMPTOMS/MOOD NEUROLOGICAL BOWEL NUTRITION STATUS      Incontinent Diet  AMBULATORY STATUS COMMUNICATION OF NEEDS Skin   Extensive Assist Verbally PU Stage and Appropriate Care, Skin abrasions, Surgical wounds PU Stage 1 Dressing:  (PRN)                     Personal Care Assistance Level of Assistance  Bathing, Feeding, Dressing Bathing Assistance: Maximum assistance Feeding assistance: Maximum assistance Dressing Assistance: Maximum assistance     Functional Limitations Info             SPECIAL CARE FACTORS FREQUENCY  PT (By licensed PT), OT (By licensed OT)     PT Frequency: 5 times a week OT Frequency: 5 times a week            Contractures Contractures Info: Not present    Additional Factors Info  Code Status, Allergies Code Status Info: DNR Allergies Info: NKA           Current Medications (07/29/2022):  This is the current hospital active medication list Current Facility-Administered Medications  Medication Dose Route Frequency Provider Last Rate Last Admin   0.9 %  sodium chloride infusion   Intravenous Continuous Regalado, Belkys A, MD 50 mL/hr at 07/29/22 1304 Rate Change at 07/29/22  1304   acetaminophen (TYLENOL) tablet 650 mg  650 mg Oral Q6H PRN Mansy, Jan A, MD       Or   acetaminophen (TYLENOL) suppository 650 mg  650 mg Rectal Q6H PRN Mansy, Jan A, MD       apixaban Arne [place]) tablet 2.5 mg  2.5 mg Oral BID Mansy, Jan A, MD   2.5 mg at 07/29/22 1049   aspirin EC tablet 81 mg  81 mg Oral Daily Mansy, Jan A, MD   81 mg at 07/29/22 1048   atorvastatin (LIPITOR) tablet 40 mg  40 mg Oral QPM Mansy, Jan A, MD       azithromycin  (ZITHROMAX) 500 mg in sodium chloride 0.9 % 250 mL IVPB  500 mg Intravenous Q24H Mansy, Jan A, MD 250 mL/hr at 07/28/22 2308 500 mg at 07/28/22 2308   diltiazem (CARDIZEM CD) 24 hr capsule 120 mg  120 mg Oral Daily Mansy, Jan A, MD   120 mg at 07/29/22 1048   insulin aspart (novoLOG) injection 0-5 Units  0-5 Units Subcutaneous QHS Mansy, Jan A, MD       insulin aspart (novoLOG) injection 0-9 Units  0-9 Units Subcutaneous TID WC Mansy, Jan A, MD       magnesium hydroxide (MILK OF MAGNESIA) suspension 30 mL  30 mL Oral Daily PRN Mansy, Jan A, MD       magnesium oxide (MAG-OX) tablet 400 mg  400 mg Oral Daily Mansy, Jan A, MD   400 mg at 07/29/22 1049   ondansetron (ZOFRAN) tablet 4 mg  4 mg Oral Q6H PRN Mansy, Jan A, MD       Or   ondansetron [facility]) injection 4 mg  4 mg Intravenous Q6H PRN Mansy, Jan A, MD       piperacillin-tazobactam (ZOSYN) IVPB 3.375 g  3.375 g Intravenous Q8H Renda Rolls, RPH 12.5 mL/hr at 07/29/22 1304 3.375 g at 07/29/22 1304   traZODone (DESYREL) tablet 25 mg  25 mg Oral QHS PRN Mansy, Arvella Merles, MD         Discharge Medications: Please see discharge summary for a list of discharge medications.  Relevant Imaging Results:  Relevant Lab Results:   Additional Information SS#: [id number]  Valente David, RN

## 2022-07-29 NOTE — Assessment & Plan Note (Signed)
-   The patient will be admitted to an observation medical telemetry bed. - We will continue antibiotic therapy with IV Rocephin and Zithromax been - We will continue hydration with IV normal saline. - His prognosis is fairly guarded. - Will consult with palliative care per the patient's daughter requested.

## 2022-07-29 NOTE — Assessment & Plan Note (Signed)
-   We will continue statin therapy. 

## 2022-07-29 NOTE — Assessment & Plan Note (Addendum)
-   This is clearly secondary to #1 and #2. - We will monitor mental status while he is here. - We will check urinalysis.

## 2022-07-29 NOTE — Progress Notes (Signed)
Patient arrive to the floor on stretcher by transport.on  3L of 02,saturating at 100%. no sign of distress noted. Bilateral iv intact. Dressing to right hand , same removed no wound or drainage noted. Patient appears unresponsive, not opening eyes on request, but eye can be seen flickering. Motor activity none even when changing linen and diaper. No response to painful stimulus.Marland Kitchen later I walk into his room he was found with eyes open and moving his arm. Spoke to patient no response but nods to questions asked. I left the room and return patient having appropriate conversation asking me where he is at, I told him and said ok this is where I want to be, stated that " I [facility] at [facility] [place].  Later on the tech enter room to do his  vitals and he was back to his previous behavior. I went to talk to him asking why he is doing this he did not answer but ask again if he is at [facility] and he  said ok. This is where I want to be and stated  that he was supposed to go to [place] to live with his daughter. Patient remain comfortable in bed.

## 2022-07-29 NOTE — TOC Initial Note (Signed)
Transition of Care [facility]) - Initial/Assessment Note    Patient Details  Name: Charles Jennings MRN: [id number] Date of Birth: [birth year]  Transition of Care [facility]) CM/SW Contact:    Valente David, RN Phone Number: 07/29/2022, 3:53 PM  Clinical Narrative:                  Patient discharged to [facility] on 2/22, readmitted on 2/23 with AMS.  Spoke with daughter Gabriel Cirri, agrees to discharge back to [facility].  Spoke with Audry Pili at [facility], they are able to accommodate patient when medically ready for discharge.   New FL2 completed.  Daughter also interested in palliative care services, agrees to [employer]  MD made aware of need for consult.   Expected Discharge Plan: Skilled Nursing Facility Barriers to Discharge: Continued Medical Work up   Patient Goals and CMS Choice Patient states their goals for this hospitalization and ongoing recovery are:: Per daughter, SNF for short term rehab, possibly palliative involvement CMS Medicare.gov Compare Post Acute Care list provided to:: Patient Represenative (must comment) Choice offered to / list presented to : Adult Children      Expected Discharge Plan and Services     Post Acute Care Choice: [place] Living arrangements for the past 2 months: Apartment                                      Prior Living Arrangements/Services Living arrangements for the past 2 months: Apartment Lives with:: Self Patient language and need for interpreter reviewed:: Yes Do you feel safe going back to the place where you live?: Yes      Need for Family Participation in Patient Care: Yes (Comment) Care giver support system in place?: Yes (comment) Current home services: DME Criminal Activity/Legal Involvement Pertinent to Current Situation/Hospitalization: No - Comment as needed  Activities of Daily Living Home Assistive Devices/Equipment: Gilford Rile (specify type) ADL Screening (condition at time of admission) Patient's cognitive  ability adequate to safely complete daily activities?: No Is the patient deaf or have difficulty hearing?: Yes Does the patient have difficulty seeing, even when wearing glasses/contacts?: No Does the patient have difficulty concentrating, remembering, or making decisions?: Yes Patient able to express need for assistance with ADLs?: No Does the patient have difficulty dressing or bathing?: Yes Independently performs ADLs?: No Communication: Independent Does the patient have difficulty walking or climbing stairs?: Yes Weakness of Legs: Both Weakness of Arms/Hands: None  Permission Sought/Granted Permission sought to share information with : Facility Art therapist granted to share information with : Yes, Verbal Permission Granted  Share Information with NAME: Gabriel Cirri  Permission granted to share info w AGENCY: [facility]  Permission granted to share info w Relationship: Daughter  Permission granted to share info w Contact Information: [phone]  Emotional Assessment       Orientation: : Oriented to Self, Oriented to Place   Psych Involvement: No (comment)  Admission diagnosis:  Aspiration pneumonia ([place]) [J69.0] Altered mental status, unspecified altered mental status type [R41.82] Pneumonia due to infectious organism, unspecified laterality, unspecified part of lung [J18.9] Patient Active Problem List   Diagnosis Date Noted   Acute metabolic encephalopathy [id number]   Dyslipidemia 07/29/2022   Type 2 diabetes mellitus with peripheral neuropathy ([place]) 07/29/2022   Essential hypertension 07/29/2022   Aspiration pneumonia ([place]) 07/28/2022   Altered mental status 07/25/2022   Hypothermia 07/25/2022   Acute hypoxic respiratory failure ([place])  07/25/2022   AKI (acute kidney injury) ([place]) 07/25/2022   History of cardiac pacemaker 07/25/2022   Pressure injury of skin 07/25/2022   Left rib fracture 07/25/2022   History of MRSA infection 07/25/2022   PAD  (peripheral artery disease) ([place]) 07/20/2022   Paranoia ([place]) 07/17/2022   Gangrene of toe ([place]) 07/15/2022   Osteomyelitis ([place]) 07/12/2022   Stage 3a chronic kidney disease (CKD) ([place]) 07/12/2022   Weakness 07/12/2022   Critical limb ischemia of left lower extremity with ulceration of foot ([place]) 06/15/2022   Nausea and vomiting 06/13/2022   Critical limb ischemia of left lower extremity ([place]) 06/12/2022   Limb ischemia 06/11/2022   CKD (chronic kidney disease) 06/11/2022   Chronic diastolic CHF (congestive heart failure), NYHA class 2 ([place]) 03/07/2018   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/2016   Benign essential HTN 02/11/2016   Complete heart block ([place]) 02/11/2016   Mixed hyperlipidemia 02/11/2016   Insulin dependent type 2 diabetes mellitus ([place]) 01/28/2016   Subdural hemorrhage ([place]) 01/28/2016   PCP:  Albina Billet, MD Pharmacy:   [facility] [address] [place] - [place] [address] Phone: [phone] Fax: [place] [phone] - [address], [address] [place] [address] Phone: [phone] Fax: [phone]     Social Determinants of Health (SDOH) Social History: SDOH Screenings   Food Insecurity: No Food Insecurity (07/29/2022)  Housing: Low Risk  (07/29/2022)  Transportation Needs: No Transportation Needs (07/29/2022)  Utilities: Not At Risk (07/29/2022)  Tobacco Use: Low Risk  (07/29/2022)  Recent Concern: Tobacco Use - Medium Risk (07/12/2022)   SDOH Interventions:     Readmission Risk Interventions     No data to display

## 2022-07-29 NOTE — Evaluation (Signed)
Clinical/Bedside Swallow Evaluation Patient Details  Name: Charles Jennings MRN: [id number] Date of Birth: 04/18/1928  Today's Date: 07/29/2022 Time: SLP Start Time (ACUTE ONLY): 48 SLP Stop Time (ACUTE ONLY): [id number] SLP Time Calculation (min) (ACUTE ONLY): 25 min  Past Medical History:  Past Medical History:  Diagnosis Date   (HFpEF) heart failure with preserved ejection fraction (HCC)    Arthritis    Complete heart block ([place])    a.) s/p dual chamber Medtronic PPM placement 01/31/2016   Frequent PVCs    Hemorrhoids    a.) s/p banding 01/2021   HLD (hyperlipidemia)    Hypertension    Long term current use of antithrombotics/antiplatelets    a.) on DAPT (ASA + clopidogrel)   Peripheral vascular disease ([place])    Presence of permanent cardiac pacemaker 01/31/2016   a.) s/p Advisa DR MRI SureScan dual chamber PPM (SN: [id number])   SDH (subdural hematoma) (HCC)    Skin cancer    Type 2 diabetes mellitus treated with insulin [facility])    Past Surgical History:  Past Surgical History:  Procedure Laterality Date   AMPUTATION TOE Left 06/30/2022   Procedure: AMPUTATION TOE - [place];  Surgeon: Sharlotte Alamo, DPM;  Location: [facility];  Service: Podiatry;  Laterality: Left;   AMPUTATION TOE Left 07/16/2022   Procedure: AMPUTATION TOE;  Surgeon: Samara Deist, DPM;  Location: [facility] OR[facility];  Service: Podiatry;  Laterality: Left;   EYE SURGERY     FEMORAL ENDARTERECTOMY Bilateral    HEMORRHOID BANDING N/A 01/2021   HERNIA REPAIR     double hernia   LOWER EXTREMITY ANGIOGRAPHY Left 06/12/2022   Procedure: Lower Extremity Angiography;  Surgeon: Algernon Huxley, MD;  Location: [facility];  Service: Cardiovascular;  Laterality: Left;   LOWER EXTREMITY ANGIOGRAPHY Left 07/13/2022   Procedure: Lower Extremity Angiography;  Surgeon: Algernon Huxley, MD;  Location: [facility];  Service: Cardiovascular;  Laterality: Left;   LOWER EXTREMITY ANGIOGRAPHY Left 07/14/2022    Procedure: Lower Extremity Angiography;  Surgeon: Algernon Huxley, MD;  Location: [facility];  Service: Cardiovascular;  Laterality: Left;   LOWER EXTREMITY INTERVENTION Bilateral 06/14/2022   Procedure: LOWER EXTREMITY INTERVENTION;  Surgeon: Algernon Huxley, MD;  Location: [facility];  Service: Cardiovascular;  Laterality: Bilateral;   PACEMAKER INSERTION Left 01/31/2016   Procedure: PACEMAKER INSERTION; Location: [facility]; Surgeon: Dimas Alexandria, MD   HPI:  89 year old with past medical history significant for hypertension, dyslipidemia, peripheral vascular disease, type 2 diabetes, heart failure preserved ejection fraction who presents with altered mental status recently discharged the day prior to admission, treated at that time for sepsis, bacteremia,  community-acquired pneumonia, hypoglycemia and AKI.  Patient was noted to be unresponsive at a skilled nursing facility, he required sternal rub to wake him up. Head CT on 07/28/2022 revealed 1. No acute intracranial abnormalities.  2. Severe chronic atrophy and small vessel ischemic changes.  3. Small, 4 mm depth, right frontoparietal chronic subdural hematoma  is unchanged since prior study. Chest x-ray on 07/28/2022 revealed New bilateral lower lung airspace disease and probable small  bilateral pleural effusions.    Assessment / Plan / Recommendation  Clinical Impression  Pt presents as pleasantly confused but also easily redirected to task at hand. He was able to hold a cup in his hand and consume trials. He is not able to follow directions for slow rate of consumption and intermittently slurred on the thin liquids d/t his  AMS. When consuming thin liquids via straw and cup, pt presented with immediate and delayed coughing. Suspect pt's s/s are attributable to his current mental state however, when consuming nectar thick liquids via cup, he was free of any overt s/s of aspiration regardless of large sips or fast sips. Education  provided to pt's family s/s of aspiration and potential increased risk of aspiration when consuming thin liquids.  Fortunately, in pt's current presentation, he is not adverse to nectar thick liquids or puree food items. Pt's family agreeable to dysphagia 1 with nectar thick liquids via cup, medicine crushed in puree. This Probation officer also provided education about possible inability to maintain hydration and nutrition as he frequently refused to eat during previous admission and his current severely altered state. Pt's daughter voiced understanding. SLP Visit Diagnosis: Dysphagia, unspecified (R13.10)    Aspiration Risk  Moderate aspiration risk;Risk for inadequate nutrition/hydration (d/t current altered state)    Diet Recommendation Dysphagia 1 (Puree);Nectar-thick liquid   Liquid Administration via: Cup Medication Administration: Crushed with puree Supervision: Full supervision/cueing for compensatory strategies;Staff to assist with self feeding Compensations: Minimize environmental distractions;Slow rate;Small sips/bites Postural Changes: Seated upright at 90 degrees;Remain upright for at least 30 minutes after po intake    Other  Recommendations Oral Care Recommendations: Oral care BID Caregiver Recommendations: Avoid jello, ice cream, thin soups, popsicles;Remove water pitcher    Recommendations for follow up therapy are one component of a multi-disciplinary discharge planning process, led by the attending physician.  Recommendations may be updated based on patient status, additional functional criteria and insurance authorization.  Follow up Recommendations No SLP follow up      Assistance Recommended at Discharge  N/A  Functional Status Assessment  (likely at baseline)  Frequency and Duration   N/A         Prognosis Prognosis for improved oropharyngeal function: Guarded Barriers to Reach Goals: Cognitive deficits;Severity of deficits;Behavior;Motivation      Swallow Study    General Date of Onset: 07/28/22 HPI: 89 year old with past medical history significant for hypertension, dyslipidemia, peripheral vascular disease, type 2 diabetes, heart failure preserved ejection fraction who presents with altered mental status recently discharged the day prior to admission, treated at that time for sepsis, bacteremia,  community-acquired pneumonia, hypoglycemia and AKI.  Patient was noted to be unresponsive at a skilled nursing facility, he required sternal rub to wake him up. Head CT on 07/28/2022 revealed 1. No acute intracranial abnormalities.  2. Severe chronic atrophy and small vessel ischemic changes.  3. Small, 4 mm depth, right frontoparietal chronic subdural hematoma  is unchanged since prior study. Chest x-ray on 07/28/2022 revealed New bilateral lower lung airspace disease and probable small  bilateral pleural effusions. Type of Study: Bedside Swallow Evaluation Previous Swallow Assessment: none in chart Diet Prior to this Study: NPO Temperature Spikes Noted: No Respiratory Status: Room air History of Recent Intubation: No Behavior/Cognition: Alert;Cooperative;Confused;Doesn't follow directions;Requires cueing;Distractible;Impulsive Oral Cavity Assessment: Within Functional Limits Oral Care Completed by SLP: No Oral Cavity - Dentition: Edentulous Self-Feeding Abilities: Needs set up;Needs assist;Total assist Patient Positioning: Upright in bed Baseline Vocal Quality: Normal Volitional Cough: Cognitively unable to elicit Volitional Swallow: Unable to elicit    Oral/Motor/Sensory Function Overall Oral Motor/Sensory Function:  (appeared adequate during consumption)   Ice Chips Ice chips: Not tested   Thin Liquid Thin Liquid: Impaired Presentation: Cup;Straw Pharyngeal  Phase Impairments: Suspected delayed Swallow;Decreased hyoid-laryngeal movement;Cough - Delayed;Cough - Immediate    Nectar Thick Nectar Thick Liquid: Within functional limits Presentation:  Cup;Self Fed   Honey Thick Honey Thick Liquid: Not tested   Puree Puree: Within functional limits Presentation: Spoon   Solid     Solid: Not tested     Damira Kem B. Rutherford Nail, M.S., CCC-SLP, Mining engineer Certified Brain Injury [place] Office [phone] Ascom [phone] Fax [phone]

## 2022-07-29 NOTE — Progress Notes (Signed)
   07/28/22 2229  Assess: MEWS Score  Temp 97.8 F (36.6 C)  BP (!) 140/62  MAP (mmHg) 83  Pulse Rate 78  Resp 12  SpO2 100 %  O2 Device Nasal Cannula  O2 Flow Rate (L/min) 2 L/min  Assess: MEWS Score  MEWS Temp 0  MEWS Systolic 0  MEWS Pulse 0  MEWS RR 1  MEWS LOC 2  MEWS Score 3  MEWS Score Color Yellow  Assess: if the MEWS score is Yellow or Red  Were vital signs taken at a resting state? Yes  Focused Assessment No change from prior assessment  Does the patient meet 2 or more of the SIRS criteria? No  MEWS guidelines implemented  Yes, yellow  Treat  MEWS Interventions Considered administering scheduled or prn medications/treatments as ordered  Take Vital Signs  Increase Vital Sign Frequency  Yellow: Q2hr x1, continue Q4hrs until patient remains green for 12hrs  Escalate  MEWS: Escalate Yellow: Discuss with charge nurse and consider notifying provider and/or RRT  Notify: Charge Nurse/RN  Name of Charge Nurse/RN Notified Donna,RN  Assess: SIRS CRITERIA  SIRS Temperature  0  SIRS Pulse 0  SIRS Respirations  0  SIRS WBC 0  SIRS Score Sum  0

## 2022-07-30 ENCOUNTER — Inpatient Hospital Stay: Payer: Medicare Other

## 2022-07-30 DIAGNOSIS — J690 Pneumonitis due to inhalation of food and vomit: Secondary | ICD-10-CM | POA: Diagnosis not present

## 2022-07-30 LAB — CBC
HCT: 35.6 % — ABNORMAL LOW (ref 39.0–52.0)
Hemoglobin: 12.1 g/dL — ABNORMAL LOW (ref 13.0–17.0)
MCH: 29.1 pg (ref 26.0–34.0)
MCHC: 34 g/dL (ref 30.0–36.0)
MCV: 85.6 fL (ref 80.0–100.0)
Platelets: 162 10*3/uL (ref 150–400)
RBC: 4.16 MIL/uL — ABNORMAL LOW (ref 4.22–5.81)
RDW: 13.3 % (ref 11.5–15.5)
WBC: 7.5 10*3/uL (ref 4.0–10.5)
nRBC: 0 % (ref 0.0–0.2)

## 2022-07-30 LAB — BASIC METABOLIC PANEL
Anion gap: 10 (ref 5–15)
BUN: 15 mg/dL (ref 8–23)
CO2: 28 mmol/L (ref 22–32)
Calcium: 8.2 mg/dL — ABNORMAL LOW (ref 8.9–10.3)
Chloride: 98 mmol/L (ref 98–111)
Creatinine, Ser: 0.86 mg/dL (ref 0.61–1.24)
GFR, Estimated: >60 mL/min (ref >60)
Glucose, Bld: 188 mg/dL — ABNORMAL HIGH (ref 70–99)
Potassium: 3.5 mmol/L (ref 3.5–5.1)
Sodium: 136 mmol/L (ref 135–145)

## 2022-07-30 LAB — CULTURE, BLOOD (ROUTINE X 2)
Culture: NO GROWTH
Culture: NO GROWTH
Special Requests: ADEQUATE
Special Requests: ADEQUATE

## 2022-07-30 LAB — MAGNESIUM: Magnesium: 1.8 mg/dL (ref 1.7–2.4)

## 2022-07-30 LAB — PHOSPHORUS: Phosphorus: 2.3 mg/dL — ABNORMAL LOW (ref 2.5–4.6)

## 2022-07-30 LAB — GLUCOSE, CAPILLARY: Glucose-Capillary: 151 mg/dL — ABNORMAL HIGH (ref 70–99)

## 2022-07-30 MED ORDER — VITAMIN D (ERGOCALCIFEROL) 1.25 MG (50000 UNIT) PO CAPS
50000.0000 [IU] | ORAL_CAPSULE | ORAL | Status: DC
  Filled 2022-07-30: qty 1

## 2022-07-30 MED ORDER — POLYVINYL ALCOHOL 1.4 % OP SOLN: 2.0000 [drp] | OPHTHALMIC | Status: DC | PRN

## 2022-07-30 MED ORDER — MEDIHONEY WOUND/BURN DRESSING EX PSTE
1.0000 | PASTE | Freq: Every day | CUTANEOUS | Status: DC
  Administered 2022-07-31: 1 via TOPICAL
  Filled 2022-07-30: qty 44

## 2022-07-30 MED ORDER — GLUCERNA SHAKE PO LIQD
237.0000 mL | Freq: Three times a day (TID) | ORAL | Status: DC
  Administered 2022-07-30: 237 mL via ORAL

## 2022-07-30 MED ORDER — QUETIAPINE FUMARATE 25 MG PO TABS
12.5000 mg | ORAL_TABLET | Freq: Two times a day (BID) | ORAL | Status: DC
  Administered 2022-07-30: 12.5 mg via ORAL
  Filled 2022-07-30 (×3): qty 1

## 2022-07-30 MED ORDER — HALOPERIDOL LACTATE 5 MG/ML IJ SOLN: 2.0000 mg | Freq: Four times a day (QID) | INTRAMUSCULAR | Status: DC | PRN

## 2022-07-30 NOTE — Progress Notes (Signed)
[facility] Progress Note  Patient: Charles Jennings    [id number]  DOA: 07/28/2022     Date of Service: the patient was seen and examined on 07/30/2022  Chief Complaint  Patient presents with   Weakness   Brief hospital course: 89 year old with past medical history significant for hypertension, dyslipidemia, peripheral vascular disease, type 2 diabetes, heart failure preserved ejection fraction who presents with altered mental status recently discharged the day prior to admission, treated at that time for sepsis, bacteremia, community-acquired pneumonia, hypoglycemia and AKI.  Patient was noted to be unresponsive at a skilled nursing facility, he required sternal rub to wake him up.   Patient in the ED he continued to be hypoxic requiring 3 L of oxygen, ammonia level 12, venous gas pH 7.3, lactic acid 1.2, CBG 84.  CT revealed no acute intracranial abnormality.  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  Assessment and Plan: Principal Problem:   Aspiration pneumonia ([place]) Active Problems:   Acute hypoxic respiratory failure (HCC)   Acute metabolic encephalopathy   Type 2 diabetes mellitus with peripheral neuropathy (HCC)   Dyslipidemia   Essential hypertension     # Acute metabolic encephalopathy: -Patient presented with altered mental status, lethargic, only responded to sternal rub. 2/25 patient was awake, still slightly confused and having paranoia, and delirium.  As per patient's daughter he had some hallucinations in the rehab. -No hypoglycemia on admission, no new medications.  Ammonia normal, pCO2 normal on venous gas -Differential could be related to infection, aspiration pneumonia, delirium due to recurrent illness, hypoxia. CT head repeated, chronic subdural hematoma 5 mm on the right side, no new findings, no mass effect. unable to have MRI due to pacemaker. Discontinued  EEG, as patient's daughter wanted him to be comfortable. Started Seroquel 12.5 mg p.o. twice daily, and Haldol IV as needed for agitations.  # Aspiration pneumonia: Chest x ray: New bilateral lower lung airspace disease and probable small bilateral pleural effusions. -Will continue with management for pneumonia, continue IV Zosyn.  Will avoid cefepime to avoid worsening delirium. -on IV Azithro   Acute hypoxic respiratory failure: Patient required 3 L of oxygen to keep oxygen saturation above 100 Continue treatment for aspiration pneumonia     Diabetes type 2, peripheral neuropathy: Discontinued insulin 70/30 and discontinued CBG monitoring as per patient's daughter, she would like to keep him comfortable.    CKD IIIa: Renal function stable.  Continue to monitor   Hypertension: Continue with Cardizem Will hold lisinopril for now   Dyslipidemia: Continue with Lipitor   Mild hyponatremia: Monitor.    Left Foot wound:  History Osteomyelitis of the left third toe: Underwent amputation left second toe MTP P and excisional debridement necrotic tissue and abscess and including tendon and muscle dorsal left foot.   PVD, s/p angioplasty with stent placement of left lower extremity 06/2022 07/2022 repeated angiogram with mechanical thrombectomy of left SFA popliteal artery and tibioperoneal trunk.  Status post tPA at the time of the procedure.  Stent placement left distal as a and above-knee popliteal artery with 6 mm diameter by 15 cm stent for residual thrombus and stenosis after thrombectomy. -On eliquis    Goals of Care;  This is patient's fourth hospitalization since the beginning of this year.  Discussed with Daughter, plan for palliative care consult, goals of care, she would like to avoid rehospitalization, to help with Quality of life.  Palliative  care consulted for further discussion and possible hospice placement  Body mass index is 22.12 kg/m.  Interventions:   Pressure  Injury 07/25/22 Buttocks Right Stage 1 -  Intact skin with non-blanchable redness of a localized area usually over a bony prominence. red, non blanchable (Active)  07/25/22 1759  Location: Buttocks  Location Orientation: Right  Staging: Stage 1 -  Intact skin with non-blanchable redness of a localized area usually over a bony prominence.  Wound Description (Comments): red, non blanchable  Present on Admission: Yes  Dressing Type Foam - Lift dressing to assess site every shift 07/29/22 2130     Pressure Injury 07/26/22 Heel Left Deep Tissue Pressure Injury - Purple or maroon localized area of discolored intact skin or blood-filled blister due to damage of underlying soft tissue from pressure and/or shear. (Active)  07/26/22 0921  Location: Heel  Location Orientation: Left  Staging: Deep Tissue Pressure Injury - Purple or maroon localized area of discolored intact skin or blood-filled blister due to damage of underlying soft tissue from pressure and/or shear.  Wound Description (Comments):   Present on Admission: Yes  Dressing Type Other (Comment) 07/29/22 1030     Diet: Dysphagia 1 diet, nectar thick liquids DVT Prophylaxis: Therapeutic Anticoagulation with Eliquis    Advance goals of care discussion: DNR  Family Communication: family was present at bedside, at the time of interview.  The pt provided permission to discuss medical plan with the family. Opportunity was given to ask question and all questions were answered satisfactorily.   Disposition:  Pt is from Home, admitted with Aspiration PNA, consulted palliative care for goals of care discussion and may qualify for hospice, which precludes a safe discharge. Discharge to Hospice is patient will qualify, awaiting for palliative care evaluation.  Subjective: No significant events overnight, in the morning time patient was complaining of bilateral eye pain, respiratory rate may be due to lotion which was applied yesterday.  RN was  advised to rinse eyes and use fresh tears.  Patient was having some delirium as per patient's daughter, patient was laying comfortably, denied any other issues.  No chest pain or palpitation, no shortness of breath.  Physical Exam: General: NAD, lying comfortably Appear in no distress, affect appropriate Eyes: PERRLA ENT: Oral Mucosa Clear, moist  Neck: no JVD,  Cardiovascular: S1 and S2 Present, no Murmur,  Respiratory: Good air entry bilaterally, mild shortness of breath, mild bibasilar crackles, no wheezing appreciated. Abdomen: Bowel Sound present, Soft and no tenderness,  Skin: no rashes Extremities: no Pedal edema, no calf tenderness Neurologic: without any new focal findings Gait not checked due to patient safety concerns  Vitals:   07/29/22 2257 07/30/22 0910 07/30/22 1019 07/30/22 1021  BP: (!) 103/40 (!) 163/151 (!) 93/41   Pulse: 91 91 65   Resp: 19 18    Temp: 97.9 F (36.6 C) 98 F (36.7 C)    TempSrc: Oral     SpO2: 100% 98% (!) 81% 100%  Weight:      Height:        Intake/Output Summary (Last 24 hours) at 07/30/2022 1446 Last data filed at 07/29/2022 1800 Gross per 24 hour  Intake 851 ml  Output --  Net 851 ml   Filed Weights   07/28/22 1653  Weight: 66 kg    Data Reviewed: I have personally reviewed and interpreted daily labs, tele strips, imagings as discussed above. I reviewed all nursing notes, pharmacy notes, vitals, pertinent old records I have discussed  plan of care as described above with RN and patient/family.  CBC: Recent Labs  Lab 07/25/22 1050 07/26/22 0338 07/27/22 0510 07/28/22 1701 07/29/22 0431 07/30/22 0917  WBC 10.0 7.3 8.5 7.5 5.9 7.5  NEUTROABS 7.1  --   --  4.7  --   --   HGB 13.3 12.0* 11.7* 13.3 11.5* 12.1*  HCT 41.0 37.2* 35.4* 41.5 35.1* 35.6*  MCV 88.9 88.4 87.6 91.4 88.6 85.6  PLT 319 232 226 184 153 0000000   Basic Metabolic Panel: Recent Labs  Lab 07/26/22 0338 07/27/22 0510 07/28/22 1701 07/29/22 0431  07/30/22 0917  NA 137 136 134* 134* 136  K 4.0 3.9 3.8 3.6 3.5  CL 104 103 99 100 98  CO2 22 23 20* 19* 28  GLUCOSE 92 62* 84 87 188*  BUN '17 17 12 12 15  '$ CREATININE 1.06 0.94 0.89 0.77 0.86  CALCIUM 7.8* 8.0* 8.1* 7.7* 8.2*  MG 1.5*  --   --   --  1.8  PHOS 3.3  --   --   --  2.3*    Studies: CT HEAD WO CONTRAST (5MM)  Result Date: 07/30/2022 CLINICAL DATA:  89 year old male with altered mental status. Right side subdural hematoma. EXAM: CT HEAD WITHOUT CONTRAST TECHNIQUE: Contiguous axial images were obtained from the base of the skull through the vertex without intravenous contrast. RADIATION DOSE REDUCTION: This exam was performed according to the departmental dose-optimization program which includes automated exposure control, adjustment of the mA and/or kV according to patient size and/or use of iterative reconstruction technique. COMPARISON:  Head CT yesterday, head CT 07/28/2022 and 07/25/2022. FINDINGS: Brain: Small and low-density right anterior and superior convexity subdural hematoma is stable since 07/25/2022. This is up to 5 mm in thickness. No midline shift or significant intracranial mass effect. Stable cerebral volume. No ventriculomegaly. Extensive, confluent bilateral cerebral white matter hypodensity. Extensive heterogeneity in the bilateral deep gray nuclei. No acute intracranial hemorrhage or acute cortically based infarct identified. Vascular: Calcified atherosclerosis at the skull base. No suspicious intracranial vascular hyperdensity. Skull: No acute osseous abnormality identified. Sinuses/Orbits: Visualized paranasal sinuses and mastoids are stable and well aerated. Other: No acute orbit or scalp soft tissue finding. Calcified scalp vessel atherosclerosis. IMPRESSION: 1. Stable small low-density right side subdural hematoma 5 mm in thickness or less. No significant intracranial mass effect. 2. No new intracranial abnormality. Stable noncontrast CT appearance of advanced  small vessel disease. Electronically Signed   By: Genevie Ann M.D.   On: 07/30/2022 08:25    Scheduled Meds:  apixaban  2.5 mg Oral BID   aspirin EC  81 mg Oral Daily   atorvastatin  40 mg Oral QPM   diltiazem  120 mg Oral Daily   feeding supplement (GLUCERNA SHAKE)  237 mL Oral TID BM   leptospermum manuka honey  1 Application Topical Daily   magnesium oxide  400 mg Oral Daily   QUEtiapine  12.5 mg Oral BID   Vitamin D (Ergocalciferol)  50,000 Units Oral Q7 days   Continuous Infusions:  azithromycin 500 mg (07/29/22 2140)   piperacillin-tazobactam (ZOSYN)  IV 3.375 g (07/30/22 1412)   PRN Meds: acetaminophen **OR** acetaminophen, haloperidol lactate, magnesium hydroxide, ondansetron **OR** ondansetron (ZOFRAN) IV, polyvinyl alcohol, traZODone  Time spent: 35 minutes  Author: Val Riles. MD [facility] 07/30/2022 2:46 PM  To reach On-call, see care teams to locate the attending and reach out to them via [email or web address]. If 7PM-7AM, please contact night-coverage If you still have  difficulty reaching the attending provider, please page the [facility] (Director on Call) for [facility] on amion for assistance.

## 2022-07-30 NOTE — Consult Note (Signed)
ORTHOPAEDIC CONSULTATION  REQUESTING PHYSICIAN: Val Riles, MD  Chief Complaint: Left foot ulceration and wound.  HPI: Charles Jennings is a 89 y.o. male who was readmitted recently for continued altered mental status.  Was discharged to a skilled nursing facility but unfortunately within 24 hours had a nonresponsive event.  Readmitted for reevaluation.  Patient was discharged with the plan for wound VAC to the left foot but was never applied due to the short stay at the skilled nursing facility.  Patient seen at bedside with daughter at this time.  Daughter relates that they are asking for hospice assistance at this point and wants to defer any invasive treatments at this time.  Past Medical History:  Diagnosis Date   (HFpEF) heart failure with preserved ejection fraction (HCC)    Arthritis    Complete heart block ([place])    a.) s/p dual chamber Medtronic PPM placement 01/31/2016   Frequent PVCs    Hemorrhoids    a.) s/p banding 01/2021   HLD (hyperlipidemia)    Hypertension    Long term current use of antithrombotics/antiplatelets    a.) on DAPT (ASA + clopidogrel)   Peripheral vascular disease ([place])    Presence of permanent cardiac pacemaker 01/31/2016   a.) s/p Advisa DR MRI SureScan dual chamber PPM (SN: [id number])   SDH (subdural hematoma) (HCC)    Skin cancer    Type 2 diabetes mellitus treated with insulin [facility])    Past Surgical History:  Procedure Laterality Date   AMPUTATION TOE Left 06/30/2022   Procedure: AMPUTATION TOE - [place];  Surgeon: Sharlotte Alamo, DPM;  Location: [facility] ORS;  Service: Podiatry;  Laterality: Left;   AMPUTATION TOE Left 07/16/2022   Procedure: AMPUTATION TOE;  Surgeon: Samara Deist, DPM;  Location: [facility] ORS;  Service: Podiatry;  Laterality: Left;   EYE SURGERY     FEMORAL ENDARTERECTOMY Bilateral    HEMORRHOID BANDING N/A 01/2021   HERNIA REPAIR     double hernia   LOWER EXTREMITY ANGIOGRAPHY Left 06/12/2022    Procedure: Lower Extremity Angiography;  Surgeon: Algernon Huxley, MD;  Location: [place] [facility];  Service: Cardiovascular;  Laterality: Left;   LOWER EXTREMITY ANGIOGRAPHY Left 07/13/2022   Procedure: Lower Extremity Angiography;  Surgeon: Algernon Huxley, MD;  Location: [place] [facility];  Service: Cardiovascular;  Laterality: Left;   LOWER EXTREMITY ANGIOGRAPHY Left 07/14/2022   Procedure: Lower Extremity Angiography;  Surgeon: Algernon Huxley, MD;  Location: [place] [facility];  Service: Cardiovascular;  Laterality: Left;   LOWER EXTREMITY INTERVENTION Bilateral 06/14/2022   Procedure: LOWER EXTREMITY INTERVENTION;  Surgeon: Algernon Huxley, MD;  Location: [place] [facility];  Service: Cardiovascular;  Laterality: Bilateral;   PACEMAKER INSERTION Left 01/31/2016   Procedure: PACEMAKER INSERTION; Location: [facility]; Surgeon: Dimas Alexandria, MD   Social History   Socioeconomic History   Marital status: Widowed    Spouse name: Not on file   Number of children: Not on file   Years of education: Not on file   Highest education level: Not on file  Occupational History   Not on file  Tobacco Use   Smoking status: Never   Smokeless tobacco: Never  Substance and Sexual Activity   Alcohol use: Not Currently   Drug use: Never   Sexual activity: Not Currently  Other Topics Concern   Not on file  Social History Narrative   Lives alone   Social Determinants of Health   Financial Resource Strain:  Not on file  Food Insecurity: No Food Insecurity (07/29/2022)   Hunger Vital Sign    Worried About Running Out of Food in the Last Year: Never true    Ran Out of Food in the Last Year: Never true  Transportation Needs: No Transportation Needs (07/29/2022)   PRAPARE - Hydrologist (Medical): No    Lack of Transportation (Non-Medical): No  Physical Activity: Not on file  Stress: Not on file  Social Connections: Not on file   Family History  Problem Relation Age of Onset    Liver disease Father    Hypertension Brother    Heart attack Brother    Alpha-1 antitrypsin deficiency Granddaughter    No Known Allergies Prior to Admission medications   Medication Sig Start Date End Date Taking? Authorizing Provider  apixaban (ELIQUIS) 2.5 MG TABS tablet Take 1 tablet (2.5 mg total) by mouth 2 (two) times daily. 07/21/22 07/21/23 Yes Val Riles, MD  aspirin EC 81 MG tablet Take 1 tablet (81 mg total) by mouth daily. Swallow whole. 07/22/22  Yes Val Riles, MD  atorvastatin (LIPITOR) 40 MG tablet Take 1 tablet (40 mg total) by mouth every evening. 06/15/22 07/28/22 Yes Richarda Osmond, MD  diltiazem (CARDIZEM CD) 120 MG 24 hr capsule Take 1 capsule (120 mg total) by mouth daily. Patient taking differently: Take 240 mg by mouth daily. 07/27/22  Yes Emeterio Reeve, DO  doxycycline (VIBRA-TABS) 100 MG tablet Take 1 tablet (100 mg total) by mouth every 12 (twelve) hours for 14 days. 07/21/22 08/04/22 Yes Val Riles, MD  gabapentin (NEURONTIN) 300 MG capsule Take 300 mg by mouth daily.   Yes [provider]  JARDIANCE 10 MG TABS tablet Take 10 mg by mouth daily.   Yes [provider]  levofloxacin (LEVAQUIN) 500 MG tablet Take 1 tablet (500 mg total) by mouth at bedtime for 14 days. 07/21/22 08/04/22 Yes Val Riles, MD  lisinopril (ZESTRIL) 40 MG tablet Take 40 mg by mouth daily.   Yes [provider]  loratadine (CLARITIN) 10 MG tablet Take 1 tablet by mouth daily. 11/30/15  Yes [provider]  acetaminophen (TYLENOL) 325 MG tablet Take 2 tablets (650 mg total) by mouth every 6 (six) hours as needed. 07/21/22 07/21/23  Val Riles, MD  Amino Acids-Protein Hydrolys (PRO-STAT [facility]) LIQD Take by mouth. 07/24/22   [provider]  magnesium oxide (MAG-OX) 400 MG tablet Take 1 tablet (400 mg total) by mouth daily for 14 days. 07/21/22 08/04/22  Val Riles, MD  metFORMIN (GLUCOPHAGE) 500 MG tablet Take 500 mg by mouth 2 (two) times  daily with a meal. 11/30/15   [provider]  NOVOLOG MIX 70/30 FLEXPEN (70-30) 100 UNIT/ML FlexPen Inject 8 Units into the skin daily with breakfast. Hold if less than 150 07/27/22   Emeterio Reeve, DO   CT HEAD WO CONTRAST (5MM)  Result Date: 07/30/2022 CLINICAL DATA:  89 year old male with altered mental status. Right side subdural hematoma. EXAM: CT HEAD WITHOUT CONTRAST TECHNIQUE: Contiguous axial images were obtained from the base of the skull through the vertex without intravenous contrast. RADIATION DOSE REDUCTION: This exam was performed according to the departmental dose-optimization program which includes automated exposure control, adjustment of the mA and/or kV according to patient size and/or use of iterative reconstruction technique. COMPARISON:  Head CT yesterday, head CT 07/28/2022 and 07/25/2022. FINDINGS: Brain: Small and low-density right anterior and superior convexity subdural hematoma is stable since 07/25/2022. This  is up to 5 mm in thickness. No midline shift or significant intracranial mass effect. Stable cerebral volume. No ventriculomegaly. Extensive, confluent bilateral cerebral white matter hypodensity. Extensive heterogeneity in the bilateral deep gray nuclei. No acute intracranial hemorrhage or acute cortically based infarct identified. Vascular: Calcified atherosclerosis at the skull base. No suspicious intracranial vascular hyperdensity. Skull: No acute osseous abnormality identified. Sinuses/Orbits: Visualized paranasal sinuses and mastoids are stable and well aerated. Other: No acute orbit or scalp soft tissue finding. Calcified scalp vessel atherosclerosis. IMPRESSION: 1. Stable small low-density right side subdural hematoma 5 mm in thickness or less. No significant intracranial mass effect. 2. No new intracranial abnormality. Stable noncontrast CT appearance of advanced small vessel disease. Electronically Signed   By: Genevie Ann M.D.   On: 07/30/2022 08:25   CT  Head Wo Contrast  Result Date: 07/28/2022 CLINICAL DATA:  Altered mental status.  Nonresponsive. EXAM: CT HEAD WITHOUT CONTRAST TECHNIQUE: Contiguous axial images were obtained from the base of the skull through the vertex without intravenous contrast. RADIATION DOSE REDUCTION: This exam was performed according to the departmental dose-optimization program which includes automated exposure control, adjustment of the mA and/or kV according to patient size and/or use of iterative reconstruction technique. COMPARISON:  07/25/2022 FINDINGS: Brain: Diffuse severe cortical atrophy. Ventricular dilatation consistent with central atrophy. Low-attenuation change throughout the deep white matter consistent small vessel ischemia. Small right fronto parietal subdural collection measuring 4 mm maximal depth. Density is consistent with chronic subdural hematoma. No change since prior study. No new extra-axial fluid collections. No mass-effect or midline shift. Basal cisterns are not effaced. No new acute intracranial hemorrhage. Vascular: Intracranial arterial calcifications. Skull: Calvarium appears intact. Sinuses/Orbits: Paranasal sinuses and mastoid air cells are clear. Other: None. IMPRESSION: 1. No acute intracranial abnormalities. 2. Severe chronic atrophy and small vessel ischemic changes. 3. Small, 4 mm depth, right frontoparietal chronic subdural hematoma is unchanged since prior study. Electronically Signed   By: Lucienne Capers M.D.   On: 07/28/2022 18:30   DG Chest Port 1 View  Result Date: 07/28/2022 CLINICAL DATA:  Sepsis. EXAM: PORTABLE CHEST 1 VIEW COMPARISON:  07/25/2022 FINDINGS: Patient is rotated to the right. Bilateral lower lung airspace disease is new since previous study. Probable small bilateral effusions also noted. Heart size is within normal limits. Permanent pacemaker remains in place. IMPRESSION: New bilateral lower lung airspace disease and probable small bilateral pleural effusions.  Electronically Signed   By: Marlaine Hind M.D.   On: 07/28/2022 17:52    Positive ROS: All other systems have been reviewed and were otherwise negative with the exception of those mentioned in the HPI and as above.  12 point ROS was performed.  Physical Exam: General: Alert and oriented.  No apparent distress.  Vascular:  Left foot:Dorsalis Pedis:  absent Posterior Tibial:  absent  Right foot:  Not evaluated today  Neuro: Gross sensation is intact  Derm: 3 x 2 cm open ulceration to the dorsal aspect of the left foot.  Some necrotic tissue in the central base of the previous noted tendons in the region.  Second toe and third toe amputation sites are stable.  Scant amount of bloody drainage.  No active purulent drainage.  Ortho/MS: Status post left second toe amputations  Assessment: Gangrene status post second and third amputations with I&D of abscess dorsal left foot There peripheral vascular disease  Plan: The dressing was applied today.  This was basically a wet-to-dry dressing.  I also applied a heel protective dressing  to prevent further breakdown of skin in this area.  At this point the patient's daughter has asked for hospice to assist.  They want to avoid any further invasive types of treatments at this time.  Will ask for left heel protective dressing dressing to be applied to the area 3 times a day.  For the dorsal left foot wound they can apply Santyl for debridement of the necrotic tissue and prevent further infectious process in this area.  This will be ordered.  Orders will be placed for the wound care to be performed.  Continue with heel protectors at all times while in bed.  At this time podiatry to sign off.  Please reconsult if needed.    Elesa Hacker, DPM Cell [phone]   07/30/2022 9:50 AM

## 2022-07-30 NOTE — Plan of Care (Signed)
  Problem: Activity: Goal: Ability to tolerate increased activity will improve Outcome: Progressing   Problem: Fluid Volume: Goal: Ability to maintain a balanced intake and output will improve Outcome: Progressing   Problem: Nutritional: Goal: Maintenance of adequate nutrition will improve Outcome: Progressing   Problem: Skin Integrity: Goal: Risk for impaired skin integrity will decrease Outcome: Progressing   Problem: Tissue Perfusion: Goal: Adequacy of tissue perfusion will improve Outcome: Progressing

## 2022-07-30 NOTE — Plan of Care (Signed)

## 2022-07-31 ENCOUNTER — Encounter: Payer: Self-pay | Admitting: Family Medicine

## 2022-07-31 DIAGNOSIS — Z515 Encounter for palliative care: Secondary | ICD-10-CM

## 2022-07-31 DIAGNOSIS — R4182 Altered mental status, unspecified: Secondary | ICD-10-CM | POA: Diagnosis not present

## 2022-07-31 DIAGNOSIS — Z7189 Other specified counseling: Secondary | ICD-10-CM | POA: Diagnosis not present

## 2022-07-31 DIAGNOSIS — G9341 Metabolic encephalopathy: Secondary | ICD-10-CM | POA: Diagnosis not present

## 2022-07-31 DIAGNOSIS — J690 Pneumonitis due to inhalation of food and vomit: Secondary | ICD-10-CM | POA: Diagnosis not present

## 2022-07-31 MED ORDER — MORPHINE SULFATE (PF) 2 MG/ML IV SOLN: 1.0000 mg | INTRAVENOUS | Status: DC | PRN

## 2022-07-31 MED ORDER — ACETAMINOPHEN 650 MG RE SUPP: 650.0000 mg | Freq: Four times a day (QID) | RECTAL | Status: DC | PRN

## 2022-07-31 MED ORDER — POLYVINYL ALCOHOL 1.4 % OP SOLN: 1.0000 [drp] | Freq: Four times a day (QID) | OPHTHALMIC | 0 refills | Status: DC | PRN

## 2022-07-31 MED ORDER — LORAZEPAM 2 MG/ML PO CONC: 1.0000 mg | ORAL | 0 refills | Status: DC | PRN

## 2022-07-31 MED ORDER — HALOPERIDOL 0.5 MG PO TABS: 0.5000 mg | ORAL_TABLET | ORAL | Status: DC | PRN

## 2022-07-31 MED ORDER — HALOPERIDOL LACTATE 2 MG/ML PO CONC: 0.5000 mg | ORAL | Status: DC | PRN

## 2022-07-31 MED ORDER — LORAZEPAM 2 MG/ML PO CONC: 1.0000 mg | ORAL | Status: DC | PRN

## 2022-07-31 MED ORDER — GLYCOPYRROLATE 1 MG PO TABS: 1.0000 mg | ORAL_TABLET | ORAL | Status: DC | PRN

## 2022-07-31 MED ORDER — GLYCOPYRROLATE 0.2 MG/ML IJ SOLN: 0.2000 mg | INTRAMUSCULAR | Status: DC | PRN

## 2022-07-31 MED ORDER — LORAZEPAM 1 MG PO TABS: 1.0000 mg | ORAL_TABLET | ORAL | 0 refills | Status: DC | PRN

## 2022-07-31 MED ORDER — LORAZEPAM 1 MG PO TABS: 1.0000 mg | ORAL_TABLET | ORAL | Status: DC | PRN

## 2022-07-31 MED ORDER — BISACODYL 10 MG RE SUPP: 10.0000 mg | RECTAL | Status: DC | PRN

## 2022-07-31 MED ORDER — HALOPERIDOL LACTATE 2 MG/ML PO CONC: 0.5000 mg | ORAL | 0 refills | Status: DC | PRN

## 2022-07-31 MED ORDER — POLYVINYL ALCOHOL 1.4 % OP SOLN: 1.0000 [drp] | Freq: Four times a day (QID) | OPHTHALMIC | Status: DC | PRN

## 2022-07-31 MED ORDER — HALOPERIDOL LACTATE 5 MG/ML IJ SOLN: 0.5000 mg | INTRAMUSCULAR | Status: DC | PRN

## 2022-07-31 MED ORDER — ACETAMINOPHEN 325 MG PO TABS: 650.0000 mg | ORAL_TABLET | Freq: Four times a day (QID) | ORAL | Status: DC | PRN

## 2022-07-31 MED ORDER — LORAZEPAM 2 MG/ML IJ SOLN: 1.0000 mg | INTRAMUSCULAR | Status: DC | PRN

## 2022-07-31 MED ORDER — BIOTENE DRY MOUTH MT LIQD: 15.0000 mL | OROMUCOSAL | Status: DC | PRN

## 2022-07-31 MED ORDER — ONDANSETRON 4 MG PO TBDP: 4.0000 mg | ORAL_TABLET | Freq: Four times a day (QID) | ORAL | Status: DC | PRN

## 2022-07-31 MED ORDER — ONDANSETRON HCL 4 MG/2ML IJ SOLN: 4.0000 mg | Freq: Four times a day (QID) | INTRAMUSCULAR | Status: DC | PRN

## 2022-07-31 NOTE — Plan of Care (Addendum)
This pt is ready for d/c. IV's left in d/t pt going to hospice facility.   Problem: Activity: Goal: Ability to tolerate increased activity will improve 07/31/2022 1457 by Verl Dicker, RN Outcome: Adequate for Discharge 07/31/2022 1225 by Verl Dicker, RN Outcome: Progressing   Problem: Clinical Measurements: Goal: Ability to maintain a body temperature in the normal range will improve 07/31/2022 1457 by Verl Dicker, RN Outcome: Adequate for Discharge 07/31/2022 1225 by Verl Dicker, RN Outcome: Progressing   Problem: Respiratory: Goal: Ability to maintain adequate ventilation will improve 07/31/2022 1457 by Verl Dicker, RN Outcome: Adequate for Discharge 07/31/2022 1225 by Verl Dicker, RN Outcome: Progressing Goal: Ability to maintain a clear airway will improve 07/31/2022 1457 by Verl Dicker, RN Outcome: Adequate for Discharge 07/31/2022 1225 by Verl Dicker, RN Outcome: Progressing   Problem: Education: Goal: Ability to describe self-care measures that may prevent or decrease complications (Diabetes Survival Skills Education) will improve 07/31/2022 1457 by Verl Dicker, RN Outcome: Adequate for Discharge 07/31/2022 1225 by Verl Dicker, RN Outcome: Progressing Goal: Individualized Educational Video(s) 07/31/2022 1457 by Verl Dicker, RN Outcome: Adequate for Discharge 07/31/2022 1225 by Verl Dicker, RN Outcome: Progressing   Problem: Coping: Goal: Ability to adjust to condition or change in health will improve 07/31/2022 1457 by Verl Dicker, RN Outcome: Adequate for Discharge 07/31/2022 1225 by Verl Dicker, RN Outcome: Progressing   Problem: Fluid Volume: Goal: Ability to maintain a balanced intake and output will improve 07/31/2022 1457 by Verl Dicker, RN Outcome: Adequate for Discharge 07/31/2022 1225 by Verl Dicker, RN Outcome: Progressing   Problem: Health Behavior/Discharge  Planning: Goal: Ability to identify and utilize available resources and services will improve 07/31/2022 1457 by Verl Dicker, RN Outcome: Adequate for Discharge 07/31/2022 1225 by Verl Dicker, RN Outcome: Progressing Goal: Ability to manage health-related needs will improve 07/31/2022 1457 by Verl Dicker, RN Outcome: Adequate for Discharge 07/31/2022 1225 by Verl Dicker, RN Outcome: Progressing   Problem: Metabolic: Goal: Ability to maintain appropriate glucose levels will improve 07/31/2022 1457 by Verl Dicker, RN Outcome: Adequate for Discharge 07/31/2022 1225 by Verl Dicker, RN Outcome: Progressing   Problem: Nutritional: Goal: Maintenance of adequate nutrition will improve 07/31/2022 1457 by Verl Dicker, RN Outcome: Adequate for Discharge 07/31/2022 1225 by Verl Dicker, RN Outcome: Progressing Goal: Progress toward achieving an optimal weight will improve 07/31/2022 1457 by Verl Dicker, RN Outcome: Adequate for Discharge 07/31/2022 1225 by Verl Dicker, RN Outcome: Progressing   Problem: Skin Integrity: Goal: Risk for impaired skin integrity will decrease 07/31/2022 1457 by Verl Dicker, RN Outcome: Adequate for Discharge 07/31/2022 1225 by Verl Dicker, RN Outcome: Progressing   Problem: Tissue Perfusion: Goal: Adequacy of tissue perfusion will improve 07/31/2022 1457 by Verl Dicker, RN Outcome: Adequate for Discharge 07/31/2022 1225 by Verl Dicker, RN Outcome: Progressing   Problem: Education: Goal: Knowledge of General Education information will improve Description: Including pain rating scale, medication(s)/side effects and non-pharmacologic comfort measures 07/31/2022 1457 by Verl Dicker, RN Outcome: Adequate for Discharge 07/31/2022 1225 by Verl Dicker, RN Outcome: Progressing   Problem: Health Behavior/Discharge Planning: Goal: Ability to manage health-related needs will  improve 07/31/2022 1457 by Verl Dicker, RN Outcome: Adequate for Discharge 07/31/2022 1225 by Verl Dicker, RN Outcome: Progressing   Problem: Clinical Measurements: Goal: Ability to maintain clinical measurements within normal  limits will improve 07/31/2022 1457 by Verl Dicker, RN Outcome: Adequate for Discharge 07/31/2022 1225 by Verl Dicker, RN Outcome: Progressing Goal: Will remain free from infection 07/31/2022 1457 by Verl Dicker, RN Outcome: Adequate for Discharge 07/31/2022 1225 by Verl Dicker, RN Outcome: Progressing Goal: Diagnostic test results will improve 07/31/2022 1457 by Verl Dicker, RN Outcome: Adequate for Discharge 07/31/2022 1225 by Verl Dicker, RN Outcome: Progressing Goal: Respiratory complications will improve 07/31/2022 1457 by Verl Dicker, RN Outcome: Adequate for Discharge 07/31/2022 1225 by Verl Dicker, RN Outcome: Progressing Goal: Cardiovascular complication will be avoided 07/31/2022 1457 by Verl Dicker, RN Outcome: Adequate for Discharge 07/31/2022 1225 by Verl Dicker, RN Outcome: Progressing   Problem: Activity: Goal: Risk for activity intolerance will decrease 07/31/2022 1457 by Verl Dicker, RN Outcome: Adequate for Discharge 07/31/2022 1225 by Verl Dicker, RN Outcome: Progressing   Problem: Nutrition: Goal: Adequate nutrition will be maintained 07/31/2022 1457 by Verl Dicker, RN Outcome: Adequate for Discharge 07/31/2022 1225 by Verl Dicker, RN Outcome: Progressing   Problem: Coping: Goal: Level of anxiety will decrease 07/31/2022 1457 by Verl Dicker, RN Outcome: Adequate for Discharge 07/31/2022 1225 by Verl Dicker, RN Outcome: Progressing   Problem: Elimination: Goal: Will not experience complications related to bowel motility 07/31/2022 1457 by Verl Dicker, RN Outcome: Adequate for Discharge 07/31/2022 1225 by Verl Dicker,  RN Outcome: Progressing Goal: Will not experience complications related to urinary retention 07/31/2022 1457 by Verl Dicker, RN Outcome: Adequate for Discharge 07/31/2022 1225 by Verl Dicker, RN Outcome: Progressing   Problem: Pain Managment: Goal: General experience of comfort will improve 07/31/2022 1457 by Verl Dicker, RN Outcome: Adequate for Discharge 07/31/2022 1225 by Verl Dicker, RN Outcome: Progressing   Problem: Safety: Goal: Ability to remain free from injury will improve 07/31/2022 1457 by Verl Dicker, RN Outcome: Adequate for Discharge 07/31/2022 1225 by Verl Dicker, RN Outcome: Progressing   Problem: Skin Integrity: Goal: Risk for impaired skin integrity will decrease 07/31/2022 1457 by Verl Dicker, RN Outcome: Adequate for Discharge 07/31/2022 1225 by Verl Dicker, RN Outcome: Progressing   Problem: Education: Goal: Knowledge of the prescribed therapeutic regimen will improve 07/31/2022 1457 by Verl Dicker, RN Outcome: Adequate for Discharge 07/31/2022 1225 by Verl Dicker, RN Outcome: Progressing   Problem: Coping: Goal: Ability to identify and develop effective coping behavior will improve 07/31/2022 1457 by Verl Dicker, RN Outcome: Adequate for Discharge 07/31/2022 1225 by Verl Dicker, RN Outcome: Progressing   Problem: Clinical Measurements: Goal: Quality of life will improve 07/31/2022 1457 by Verl Dicker, RN Outcome: Adequate for Discharge 07/31/2022 1225 by Verl Dicker, RN Outcome: Progressing   Problem: Respiratory: Goal: Verbalizations of increased ease of respirations will increase 07/31/2022 1457 by Verl Dicker, RN Outcome: Adequate for Discharge 07/31/2022 1225 by Verl Dicker, RN Outcome: Progressing   Problem: Role Relationship: Goal: Family's ability to cope with current situation will improve 07/31/2022 1457 by Verl Dicker, RN Outcome: Adequate  for Discharge 07/31/2022 1225 by Verl Dicker, RN Outcome: Progressing Goal: Ability to verbalize concerns, feelings, and thoughts to partner or family member will improve 07/31/2022 1457 by Verl Dicker, RN Outcome: Adequate for Discharge 07/31/2022 1225 by Verl Dicker, RN Outcome: Progressing   Problem: Pain Management: Goal: Satisfaction with pain management regimen will improve 07/31/2022 1457 by Verl Dicker,  RN Outcome: Adequate for Discharge 07/31/2022 1225 by Verl Dicker, RN Outcome: Progressing

## 2022-07-31 NOTE — Progress Notes (Signed)
Manufacturing systems engineer Liaison Note  Received request from Gerilyn Pilgrim, SW [facility] for hospice services at [facility]). Spoke with Laural Benes, patient's daughter,  to initiate education related to hospice philosophy, services, team approach to care and IPU criteria.  Patient/family verbalized understanding of information given.   Spoke with Dr. Antonieta Loveless, hospice physician, who approves patient to come to the [facility].  Bed offer made to [facility] and bed offer accepted.    Gabriel Cirri will go to the Hospice Home to complete admission paperwork and consents.   - EMS transport arranged for 1730 pick up from [facility]. - RN to call report to [phone]  Please send signed and completed DNR home with patient/family if applicable.   [facility] information and contact numbers given to [place]. Continued collaboration with patient/family, [facility] staff and [facility] staff through final disposition.  Please call with any hospice related questions or concerns. Thank you for the opportunity to participate in this patient's care.  Dimas Aguas, RN Nurse Liaison [phone]

## 2022-07-31 NOTE — Progress Notes (Signed)
[facility] Progress Note  Patient: Charles Jennings    [id number]  DOA: 07/28/2022     Date of Service: the patient was seen and examined on 07/31/2022  Chief Complaint  Patient presents with   Weakness   Brief hospital course: 89 year old with past medical history significant for hypertension, dyslipidemia, peripheral vascular disease, type 2 diabetes, heart failure preserved ejection fraction who presents with altered mental status recently discharged the day prior to admission, treated at that time for sepsis, bacteremia, community-acquired pneumonia, hypoglycemia and AKI.  Patient was noted to be unresponsive at a skilled nursing facility, he required sternal rub to wake him up.   Patient in the ED he continued to be hypoxic requiring 3 L of oxygen, ammonia level 12, venous gas pH 7.3, lactic acid 1.2, CBG 84.  CT revealed no acute intracranial abnormality.  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  Assessment and Plan: Principal Problem:   Aspiration pneumonia ([place]) Active Problems:   Acute hypoxic respiratory failure (HCC)   Acute metabolic encephalopathy   Type 2 diabetes mellitus with peripheral neuropathy (HCC)   Dyslipidemia   Essential hypertension     # Acute metabolic encephalopathy: -Patient presented with altered mental status, lethargic, only responded to sternal rub. 2/25 patient was awake, still slightly confused and having paranoia, and delirium.  As per patient's daughter he had some hallucinations in the rehab. -No hypoglycemia on admission, no new medications.  Ammonia normal, pCO2 normal on venous gas -Differential could be related to infection, aspiration pneumonia, delirium due to recurrent illness, hypoxia. CT head repeated, chronic subdural hematoma 5 mm on the right side, no new findings, no mass effect. unable to have MRI due to pacemaker. Discontinued  EEG, as patient's daughter wanted him to be comfortable. S/p Seroquel 12.5 mg p.o. twice daily, and Haldol IV as needed for agitations. 2/26 comfort care medications were ordered by palliative care team.  Awaiting for hospice evaluation.  # Aspiration pneumonia: Chest x ray: New bilateral lower lung airspace disease and probable small bilateral pleural effusions. S/p IV Zosyn and azithromycin, discontinued due to comfort care started on 2/26.   Acute hypoxic respiratory failure: Patient required 3 L of oxygen to keep oxygen saturation above 100 S/p IV antibiotics, discontinued due to comfort measures    Diabetes type 2, peripheral neuropathy: Discontinued insulin 70/30 and discontinued CBG monitoring as per patient's daughter, she would like to keep him comfortable.  CKD IIIa: Renal function stable.   Hypertension: s/p Cardizem, and lisinopril, discontinued meds due to comfort measures Dyslipidemia: d/c'd Lipitor, due to comfort measure Left Foot wound:  History Osteomyelitis of the left third toe: Underwent amputation left second toe MTP P and excisional debridement necrotic tissue and abscess and including tendon and muscle dorsal left foot. PVD, s/p angioplasty with stent placement of left lower extremity 06/2022 07/2022 repeated angiogram with mechanical thrombectomy of left SFA popliteal artery and tibioperoneal trunk.  Status post tPA at the time of the procedure.  Stent placement left distal as a and above-knee popliteal artery with 6 mm diameter by 15 cm stent for residual thrombus and stenosis after thrombectomy. Patient was on Eliquis which has been discontinued due to comfort measures.    Goals of Care: Overall prognosis is poor, multiple hospitalization and advanced age, several above comorbidities.  Palliative care was consulted and patient was made comfort measures only, awaiting for hospice evaluation.   Body  mass index is 22.12 kg/m.  Interventions:   Pressure Injury  07/25/22 Buttocks Right Stage 1 -  Intact skin with non-blanchable redness of a localized area usually over a bony prominence. red, non blanchable (Active)  07/25/22 1759  Location: Buttocks  Location Orientation: Right  Staging: Stage 1 -  Intact skin with non-blanchable redness of a localized area usually over a bony prominence.  Wound Description (Comments): red, non blanchable  Present on Admission: Yes  Dressing Type Foam - Lift dressing to assess site every shift 07/31/22 0900     Pressure Injury 07/26/22 Heel Left Deep Tissue Pressure Injury - Purple or maroon localized area of discolored intact skin or blood-filled blister due to damage of underlying soft tissue from pressure and/or shear. (Active)  07/26/22 0921  Location: Heel  Location Orientation: Left  Staging: Deep Tissue Pressure Injury - Purple or maroon localized area of discolored intact skin or blood-filled blister due to damage of underlying soft tissue from pressure and/or shear.  Wound Description (Comments):   Present on Admission: Yes  Dressing Type Other (Comment) 07/29/22 1030     Diet: Dysphagia 1 diet, nectar thick liquids DVT Prophylaxis: Therapeutic Anticoagulation with Eliquis    Advance goals of care discussion: DNR  Family Communication: family was present at bedside, at the time of interview.  The pt provided permission to discuss medical plan with the family. Opportunity was given to ask question and all questions were answered satisfactorily.   Disposition:  Pt is from Home, admitted with Aspiration PNA, palliative care was consulted, patient was made comfort measures only.  Hospice evaluation is pending.  TOC following.  Subjective: No significant events overnight, in the morning patient was resting comfortably, patient was in deep sleep, did not respond to verbal stimuli.  Started on wake him up.  Patient seems without any acute distress.  Physical Exam: General:  lying comfortably, sleeping  without any acute distress Appear in no distress, affect appropriate Eyes: closed ENT: Oral Mucosa Clear, moist  Neck: no JVD,  Cardiovascular: S1 and S2 Present, no Murmur,  Respiratory: Good air entry bilaterally, mild shortness of breath, mild bibasilar crackles, no wheezing appreciated. Abdomen: Bowel Sound present, Soft and no tenderness,  Skin: no rashes Extremities: no Pedal edema, no calf tenderness Neurologic: without any new focal findings Gait not checked due to patient safety concerns  Vitals:   07/30/22 0910 07/30/22 1019 07/30/22 1021 07/30/22 2113  BP: (!) 163/151 (!) 93/41  (!) 144/51  Pulse: 91 65  72  Resp: 18   18  Temp: 98 F (36.7 C)   98.6 F (37 C)  TempSrc:      SpO2: 98% (!) 81% 100% 95%  Weight:      Height:        Intake/Output Summary (Last 24 hours) at 07/31/2022 1323 Last data filed at 07/31/2022 0542 Gross per 24 hour  Intake 647.41 ml  Output 300 ml  Net 347.41 ml   Filed Weights   07/28/22 1653  Weight: 66 kg    Data Reviewed: I have personally reviewed and interpreted daily labs, tele strips, imagings as discussed above. I reviewed all nursing notes, pharmacy notes, vitals, pertinent old records I have discussed plan of care as described above with RN and patient/family.  CBC: Recent Labs  Lab 07/25/22 1050 07/26/22 0338 07/27/22 0510 07/28/22 1701 07/29/22 0431 07/30/22 0917  WBC 10.0 7.3 8.5 7.5 5.9 7.5  NEUTROABS 7.1  --   --  4.7  --   --  HGB 13.3 12.0* 11.7* 13.3 11.5* 12.1*  HCT 41.0 37.2* 35.4* 41.5 35.1* 35.6*  MCV 88.9 88.4 87.6 91.4 88.6 85.6  PLT 319 232 226 184 153 0000000   Basic Metabolic Panel: Recent Labs  Lab 07/26/22 0338 07/27/22 0510 07/28/22 1701 07/29/22 0431 07/30/22 0917  NA 137 136 134* 134* 136  K 4.0 3.9 3.8 3.6 3.5  CL 104 103 99 100 98  CO2 22 23 20* 19* 28  GLUCOSE 92 62* 84 87 188*  BUN '17 17 12 12 15  '$ CREATININE 1.06 0.94 0.89 0.77 0.86  CALCIUM 7.8* 8.0* 8.1* 7.7* 8.2*  MG 1.5*   --   --   --  1.8  PHOS 3.3  --   --   --  2.3*    Studies: No results found.  Scheduled Meds:  leptospermum manuka honey  1 Application Topical Daily   Continuous Infusions:   PRN Meds: acetaminophen **OR** acetaminophen, antiseptic oral rinse, bisacodyl, glycopyrrolate **OR** glycopyrrolate **OR** glycopyrrolate, haloperidol **OR** haloperidol **OR** haloperidol lactate, LORazepam **OR** LORazepam **OR** LORazepam, morphine injection, ondansetron **OR** ondansetron (ZOFRAN) IV, polyvinyl alcohol  Time spent: 35 minutes  Author: Val Riles. MD [facility] 07/31/2022 1:23 PM  To reach On-call, see care teams to locate the attending and reach out to them via [email or web address]. If 7PM-7AM, please contact night-coverage If you still have difficulty reaching the attending provider, please page the [facility] (Director on Call) for [facility] on amion for assistance.

## 2022-07-31 NOTE — Progress Notes (Signed)
Called and gave report to Milana Huntsman RN at the hospice facility

## 2022-07-31 NOTE — Discharge Summary (Signed)
[facility] Discharge Summary   Patient: Charles Jennings [id number]  PCP: Charles Billet, MD  Date of admission: 07/28/2022   Date of discharge:  07/31/2022     Discharge Diagnoses:  Principal Problem:   Aspiration pneumonia ([place]) Active Problems:   Acute hypoxic respiratory failure ([place])   Acute metabolic encephalopathy   Type 2 diabetes mellitus with peripheral neuropathy ([place])   Dyslipidemia   Essential hypertension   Admitted From: Home Disposition:  Residential hospice   Recommendations for Outpatient Follow-up:  Hospice care Follow up LABS/TEST:  none   Diet recommendation: Regular diet  Activity: The patient is advised to gradually reintroduce usual activities, as tolerated  Discharge Condition: stable  Code Status: DNR   History of present illness: As per the H and P dictated on admission Hospital Course:  89 year old with past medical history significant for hypertension, dyslipidemia, peripheral vascular disease, type 2 diabetes, heart failure preserved ejection fraction who presents with altered mental status recently discharged the day prior to admission, treated at that time for sepsis, bacteremia, community-acquired pneumonia, hypoglycemia and AKI.  Patient was noted to be unresponsive at a skilled nursing facility, he required sternal rub to wake him up. Patient in the ED he continued to be hypoxic requiring 3 L of oxygen, ammonia level 12, venous gas pH 7.3, lactic acid 1.2, CBG 84.  CT revealed no acute intracranial abnormality.  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   Assessment and Plan: # Acute metabolic encephalopathy: -Patient presented with altered mental status, lethargic, only responded to sternal rub. 2/25 patient was awake, still slightly confused and having paranoia, and delirium.  As per patient's daughter he had some hallucinations in the  rehab. -No hypoglycemia on admission, no new medications.  Ammonia normal, pCO2 normal on venous gas -Differential could be related to infection, aspiration pneumonia, delirium due to recurrent illness, hypoxia. CT head repeated, chronic subdural hematoma 5 mm on the right side, no new findings, no mass effect. unable to have MRI due to pacemaker. Discontinued EEG, as patient's daughter wanted him to be comfortable. S/p Seroquel 12.5 mg p.o. twice daily, and Haldol IV as needed for agitations. 2/26 comfort care medications were ordered by palliative care team.  Awaiting for hospice evaluation. # Aspiration pneumonia: Chest x ray: New bilateral lower lung airspace disease and probable small bilateral pleural effusions. S/p IV Zosyn and azithromycin, discontinued due to comfort care started on 2/26. # Acute hypoxic respiratory failure: Patient required 3 L of oxygen to keep oxygen saturation above 100 S/p IV antibiotics, discontinued due to comfort measures  # Diabetes type 2, peripheral neuropathy: Discontinued insulin 70/30 and discontinued CBG monitoring as per patient's daughter, she would like to keep him comfortable. # CKD IIIa: Renal function stable.   # Hypertension: s/p Cardizem, and lisinopril, discontinued meds due to comfort measures # Dyslipidemia: d/c'd Lipitor, due to comfort measure # Left Foot wound:  # History Osteomyelitis of the left third toe: Underwent amputation left second toe MTP P and excisional debridement necrotic tissue and abscess and including tendon and muscle dorsal left foot. # PVD, s/p angioplasty with stent placement of left lower extremity 06/2022 07/2022 repeated angiogram with mechanical thrombectomy of left SFA popliteal artery and tibioperoneal trunk.  Status post tPA at the time of the procedure.  Stent placement left distal as a and above-knee popliteal artery with 6 mm diameter by 15 cm stent for  residual thrombus and stenosis after thrombectomy. Patient  was on Eliquis which has been discontinued due to comfort measures.   Goals of Care: Overall prognosis is poor, multiple hospitalization and advanced age, several above comorbidities.  Palliative care was consulted and patient was made comfort measures only, hospice evaluation was done.  Patient got accepted for hospice facility and bed is available, so we will discharge now.  Body mass index is 22.12 kg/m.  Nutrition Interventions:   Pressure Injury 07/25/22 Buttocks Right Stage 1 -  Intact skin with non-blanchable redness of a localized area usually over a bony prominence. red, non blanchable (Active)  07/25/22 1759  Location: Buttocks  Location Orientation: Right  Staging: Stage 1 -  Intact skin with non-blanchable redness of a localized area usually over a bony prominence.  Wound Description (Comments): red, non blanchable  Present on Admission: Yes  Dressing Type Foam - Lift dressing to assess site every shift 07/31/22 0900     Pressure Injury 07/26/22 Heel Left Deep Tissue Pressure Injury - Purple or maroon localized area of discolored intact skin or blood-filled blister due to damage of underlying soft tissue from pressure and/or shear. (Active)  07/26/22 0921  Location: Heel  Location Orientation: Left  Staging: Deep Tissue Pressure Injury - Purple or maroon localized area of discolored intact skin or blood-filled blister due to damage of underlying soft tissue from pressure and/or shear.  Wound Description (Comments):   Present on Admission: Yes  Dressing Type Other (Comment) 07/29/22 1030     Patient was made comfort care by palliative care, and patient got accepted at the hospice facility so patient is being discharged today.  Consultants: Palliative care and hospice Procedures: none  Discharge Exam: General: Appear in no distress, no Rash; Oral Mucosa Clear, and dry  Cardiovascular: S1 and S2 Present, no Murmur, Respiratory: normal respiratory effort, Bilateral Air  entry present and no Crackles, no wheezes Abdomen: Bowel Sound present, Soft and no tenderness, no hernia Extremities: no Pedal edema, no calf tenderness Neurology: Patient was sleepy after comfort care medications,   Filed Weights   07/28/22 1653  Weight: 66 kg   Vitals:   07/30/22 1021 07/30/22 2113  BP:  (!) 144/51  Pulse:  72  Resp:  18  Temp:  98.6 F (37 C)  SpO2: 100% 95%    DISCHARGE MEDICATION: Allergies as of 07/31/2022   No Known Allergies      Medication List     STOP taking these medications    acetaminophen 325 MG tablet Commonly known as: Tylenol   apixaban 2.5 MG Tabs tablet Commonly known as: ELIQUIS   aspirin EC 81 MG tablet   atorvastatin 40 MG tablet Commonly known as: LIPITOR   diltiazem 120 MG 24 hr capsule Commonly known as: CARDIZEM CD   doxycycline 100 MG tablet Commonly known as: VIBRA-TABS   gabapentin 300 MG capsule Commonly known as: NEURONTIN   Jardiance 10 MG Tabs tablet Generic drug: empagliflozin   levofloxacin 500 MG tablet Commonly known as: LEVAQUIN   lisinopril 40 MG tablet Commonly known as: ZESTRIL   loratadine 10 MG tablet Commonly known as: CLARITIN   magnesium oxide 400 MG tablet Commonly known as: MAG-OX   metFORMIN 500 MG tablet Commonly known as: GLUCOPHAGE   NovoLOG Mix 70/30 FlexPen (70-30) 100 UNIT/ML FlexPen Generic drug: insulin aspart protamine - aspart   Pro-Stat AWC Liqd       TAKE these medications    glycopyrrolate 1 MG tablet Commonly  known as: ROBINUL Take 1 tablet (1 mg total) by mouth every 4 (four) hours as needed (excessive secretions).   haloperidol 0.5 MG tablet Commonly known as: HALDOL Take 1 tablet (0.5 mg total) by mouth every 4 (four) hours as needed for agitation (or delirium).   haloperidol 2 MG/ML solution Commonly known as: HALDOL Place 0.3 mLs (0.6 mg total) under the tongue every 4 (four) hours as needed for agitation (or delirium).   LORazepam 1 MG  tablet Commonly known as: ATIVAN Take 1 tablet (1 mg total) by mouth every 4 (four) hours as needed for anxiety.   LORazepam 2 MG/ML concentrated solution Commonly known as: ATIVAN Place 0.5 mLs (1 mg total) under the tongue every 4 (four) hours as needed for anxiety.   polyvinyl alcohol 1.4 % ophthalmic solution Commonly known as: LIQUIFILM TEARS Place 1 drop into both eyes 4 (four) times daily as needed for dry eyes.               Discharge Care Instructions  (From admission, onward)           Start     Ordered   07/31/22 0000  Discharge wound care:       Comments: As above   07/31/22 1436           No Known Allergies Discharge Instructions     Diet - low sodium heart healthy   Complete by: As directed    Discharge instructions   Complete by: As directed    Follow-up with hospice care   Discharge wound care:   Complete by: As directed    As above   Increase activity slowly   Complete by: As directed        The results of significant diagnostics from this hospitalization (including imaging, microbiology, ancillary and laboratory) are listed below for reference.    Significant Diagnostic Studies: CT HEAD WO CONTRAST (5MM)  Result Date: 07/30/2022 CLINICAL DATA:  89 year old male with altered mental status. Right side subdural hematoma. EXAM: CT HEAD WITHOUT CONTRAST TECHNIQUE: Contiguous axial images were obtained from the base of the skull through the vertex without intravenous contrast. RADIATION DOSE REDUCTION: This exam was performed according to the departmental dose-optimization program which includes automated exposure control, adjustment of the mA and/or kV according to patient size and/or use of iterative reconstruction technique. COMPARISON:  Head CT yesterday, head CT 07/28/2022 and 07/25/2022. FINDINGS: Brain: Small and low-density right anterior and superior convexity subdural hematoma is stable since 07/25/2022. This is up to 5 mm in thickness.  No midline shift or significant intracranial mass effect. Stable cerebral volume. No ventriculomegaly. Extensive, confluent bilateral cerebral white matter hypodensity. Extensive heterogeneity in the bilateral deep gray nuclei. No acute intracranial hemorrhage or acute cortically based infarct identified. Vascular: Calcified atherosclerosis at the skull base. No suspicious intracranial vascular hyperdensity. Skull: No acute osseous abnormality identified. Sinuses/Orbits: Visualized paranasal sinuses and mastoids are stable and well aerated. Other: No acute orbit or scalp soft tissue finding. Calcified scalp vessel atherosclerosis. IMPRESSION: 1. Stable small low-density right side subdural hematoma 5 mm in thickness or less. No significant intracranial mass effect. 2. No new intracranial abnormality. Stable noncontrast CT appearance of advanced small vessel disease. Electronically Signed   By: Genevie Ann M.D.   On: 07/30/2022 08:25   CT Head Wo Contrast  Result Date: 07/28/2022 CLINICAL DATA:  Altered mental status.  Nonresponsive. EXAM: CT HEAD WITHOUT CONTRAST TECHNIQUE: Contiguous axial images were obtained from the base of  the skull through the vertex without intravenous contrast. RADIATION DOSE REDUCTION: This exam was performed according to the departmental dose-optimization program which includes automated exposure control, adjustment of the mA and/or kV according to patient size and/or use of iterative reconstruction technique. COMPARISON:  07/25/2022 FINDINGS: Brain: Diffuse severe cortical atrophy. Ventricular dilatation consistent with central atrophy. Low-attenuation change throughout the deep white matter consistent small vessel ischemia. Small right fronto parietal subdural collection measuring 4 mm maximal depth. Density is consistent with chronic subdural hematoma. No change since prior study. No new extra-axial fluid collections. No mass-effect or midline shift. Basal cisterns are not effaced. No  new acute intracranial hemorrhage. Vascular: Intracranial arterial calcifications. Skull: Calvarium appears intact. Sinuses/Orbits: Paranasal sinuses and mastoid air cells are clear. Other: None. IMPRESSION: 1. No acute intracranial abnormalities. 2. Severe chronic atrophy and small vessel ischemic changes. 3. Small, 4 mm depth, right frontoparietal chronic subdural hematoma is unchanged since prior study. Electronically Signed   By: Lucienne Capers M.D.   On: 07/28/2022 18:30   DG Chest Port 1 View  Result Date: 07/28/2022 CLINICAL DATA:  Sepsis. EXAM: PORTABLE CHEST 1 VIEW COMPARISON:  07/25/2022 FINDINGS: Patient is rotated to the right. Bilateral lower lung airspace disease is new since previous study. Probable small bilateral effusions also noted. Heart size is within normal limits. Permanent pacemaker remains in place. IMPRESSION: New bilateral lower lung airspace disease and probable small bilateral pleural effusions. Electronically Signed   By: Marlaine Hind M.D.   On: 07/28/2022 17:52   DG Chest Port 1 View  Result Date: 07/25/2022 CLINICAL DATA:  Altered level of consciousness, sirs EXAM: PORTABLE CHEST 1 VIEW COMPARISON:  01/28/2016 FINDINGS: Single frontal view of the chest demonstrates dual lead pacer overlying left chest, proximal lead overlying right atrium and distal lead overlying right ventricle. Cardiac silhouette is unremarkable. Atherosclerosis of the aortic arch. There is patchy consolidation in the retrocardiac region which could reflect atelectasis or airspace disease. No effusion or pneumothorax. Minimally displaced left posterolateral eighth rib fracture. IMPRESSION: 1. Patchy retrocardiac consolidation may reflect atelectasis or airspace disease. 2. Minimally displaced acute posterolateral left eighth rib fracture. Electronically Signed   By: Randa Ngo M.D.   On: 07/25/2022 15:25   DG Foot Complete Left  Result Date: 07/25/2022 CLINICAL DATA:  Left foot pain, wound EXAM:  LEFT FOOT - COMPLETE 3+ VIEW COMPARISON:  07/13/2022 FINDINGS: Interval amputation of the second toe at the MTP joint. Previous amputation of the third toe. Diffuse bony demineralization. Flexion of the remaining fourth and fifth toes causes bony superimposition and reduced sensitivity of assessment of these toes. Subtle periosteal reaction along the shafts of the second, third, fourth metatarsals best appreciated on the oblique projection. No bony destructive findings. Hazy lucency along the presume soft tissue flap distal to the amputation sites could represent wound or small amount of gas in the soft tissues. There is also a suggestion of a dorsal soft tissue defect along the distal forefoot on the lateral projection possibly from ulceration/wound Os peroneus noted. Plantar calcaneal spur. Vascular calcifications noted. IMPRESSION: 1. Interval amputation of the second toe at the MTP joint. Previous amputation of the third toe. 2. Subtle periosteal reaction along the shafts of the second, third, and fourth metatarsals, but no bony destructive findings specific for active osteomyelitis. 3. Hazy lucency along the presume soft tissue flap distal to the amputation sites could represent wound or small amount of gas in the soft tissues. There is also a dorsal soft tissue defect  along the distal forefoot on the lateral projection possibly from ulceration/wound. Electronically Signed   By: Van Clines M.D.   On: 07/25/2022 12:29   CT HEAD WO CONTRAST (5MM)  Result Date: 07/25/2022 CLINICAL DATA:  Altered level of consciousness, headache EXAM: CT HEAD WITHOUT CONTRAST TECHNIQUE: Contiguous axial images were obtained from the base of the skull through the vertex without intravenous contrast. RADIATION DOSE REDUCTION: This exam was performed according to the departmental dose-optimization program which includes automated exposure control, adjustment of the mA and/or kV according to patient size and/or use of  iterative reconstruction technique. COMPARISON:  01/28/2016 FINDINGS: Brain: There is atrophy and chronic small vessel disease changes. Chronic low-density right subdural hematoma noted measuring 4 mm in thickness compared with 13 mm previously. No mass effect or midline shift. No acute hemorrhage or hydrocephalus. Vascular: No hyperdense vessel or unexpected calcification. Skull: No acute calvarial abnormality. Sinuses/Orbits: No acute findings Other: None IMPRESSION: Decreasing size of the chronic right subdural hematoma, 4 mm currently compared to 13 mm previously. No mass effect or midline shift. Atrophy, chronic microvascular disease. No acute intracranial abnormality. Electronically Signed   By: Rolm Baptise M.D.   On: 07/25/2022 11:24   PERIPHERAL VASCULAR CATHETERIZATION  Result Date: 07/14/2022 See surgical note for result.  PERIPHERAL VASCULAR CATHETERIZATION  Result Date: 07/13/2022 See surgical note for result.  DG Foot Complete Left  Result Date: 07/13/2022 CLINICAL DATA:  Gangrene of second toe EXAM: LEFT FOOT - COMPLETE 3+ VIEW COMPARISON:  06/11/2022 FINDINGS: Bones are demineralized. Interval third toe amputation at the MTP joint level. No erosion or periosteal elevation is seen. No fracture or dislocation. Soft tissue swelling at the amputation site with a small amount of soft tissue air. IMPRESSION: Interval third toe amputation at the MTP joint level. Soft tissue swelling at the amputation site with a small amount of soft tissue air. No radiographic evidence of osteomyelitis. Electronically Signed   By: Davina Poke D.O.   On: 07/13/2022 15:18    Microbiology: Recent Results (from the past 240 hour(s))  Blood culture (routine x 2)     Status: None   Collection Time: 07/25/22 11:40 AM   Specimen: BLOOD RIGHT HAND  Result Value Ref Range Status   Specimen Description BLOOD RIGHT HAND  Final   Special Requests   Final    BOTTLES DRAWN AEROBIC AND ANAEROBIC Blood Culture  adequate volume   Culture   Final    NO GROWTH 5 DAYS Performed at [facility], [address], [place], [place] [address]    Report Status 07/30/2022 FINAL  Final  Blood culture (routine x 2)     Status: None   Collection Time: 07/25/22  1:16 PM   Specimen: BLOOD  Result Value Ref Range Status   Specimen Description BLOOD BLOOD RIGHT HAND  Final   Special Requests   Final    BOTTLES DRAWN AEROBIC AND ANAEROBIC Blood Culture adequate volume   Culture   Final    NO GROWTH 5 DAYS Performed at [facility], [address], [place], [place] [address]    Report Status 07/30/2022 FINAL  Final  Blood Culture (routine x 2)     Status: None (Preliminary result)   Collection Time: 07/28/22  5:01 PM   Specimen: BLOOD  Result Value Ref Range Status   Specimen Description BLOOD LEFT ANTECUBITAL  Final   Special Requests   Final    BOTTLES DRAWN AEROBIC AND ANAEROBIC Blood Culture adequate volume  Culture   Final    NO GROWTH 3 DAYS Performed at [facility], [address] [place], [place] [address]    Report Status PENDING  Incomplete  Blood Culture (routine x 2)     Status: None (Preliminary result)   Collection Time: 07/28/22  6:35 PM   Specimen: BLOOD  Result Value Ref Range Status   Specimen Description BLOOD BLOOD LEFT WRIST  Final   Special Requests   Final    BOTTLES DRAWN AEROBIC AND ANAEROBIC Blood Culture adequate volume   Culture   Final    NO GROWTH 3 DAYS Performed at [facility], [address] [place], [place] [address]    Report Status PENDING  Incomplete     Labs: CBC: Recent Labs  Lab 07/25/22 1050 07/26/22 0338 07/27/22 0510 07/28/22 1701 07/29/22 0431 07/30/22 0917  WBC 10.0 7.3 8.5 7.5 5.9 7.5  NEUTROABS 7.1  --   --  4.7  --   --   HGB 13.3 12.0* 11.7* 13.3 11.5* 12.1*  HCT 41.0 37.2* 35.4* 41.5 35.1* 35.6*  MCV 88.9 88.4 87.6 91.4 88.6 85.6  PLT 319 232 226 184 153 0000000   Basic Metabolic Panel: Recent  Labs  Lab 07/26/22 0338 07/27/22 0510 07/28/22 1701 07/29/22 0431 07/30/22 0917  NA 137 136 134* 134* 136  K 4.0 3.9 3.8 3.6 3.5  CL 104 103 99 100 98  CO2 22 23 20* 19* 28  GLUCOSE 92 62* 84 87 188*  BUN '17 17 12 12 15  '$ CREATININE 1.06 0.94 0.89 0.77 0.86  CALCIUM 7.8* 8.0* 8.1* 7.7* 8.2*  MG 1.5*  --   --   --  1.8  PHOS 3.3  --   --   --  2.3*   Liver Function Tests: Recent Labs  Lab 07/25/22 1050 07/28/22 1701  AST 42* 31  ALT 27 24  ALKPHOS 173* 143*  BILITOT 1.3* 1.2  PROT 5.3* 4.7*  ALBUMIN 2.4* 2.0*   No results for input(s): "LIPASE", "AMYLASE" in the last 168 hours. Recent Labs  Lab 07/28/22 1836  AMMONIA 12   Cardiac Enzymes: No results for input(s): "CKTOTAL", "CKMB", "CKMBINDEX", "TROPONINI" in the last 168 hours. BNP (last 3 results) No results for input(s): "BNP" in the last 8760 hours. CBG: Recent Labs  Lab 07/29/22 0732 07/29/22 1213 07/29/22 1633 07/29/22 2259 07/30/22 0843  GLUCAP 113* 96 175* 206* 151*    Time spent: 35 minutes  Signed:  Val Riles  [facility] 07/31/2022 2:36 PM

## 2022-07-31 NOTE — TOC Progression Note (Signed)
Transition of Care [facility]) - Progression Note    Patient Details  Name: Charles Jennings MRN: [id number] Date of Birth: [birth year]  [facility]) CM/SW Contact  Gerilyn Pilgrim, LCSW Phone Number: 07/31/2022, 2:24 PM  Clinical Narrative:   [facility] able to offer bed on patient CSW will arrange transport via [facility].     Expected Discharge Plan: Skilled Nursing Facility Barriers to Discharge: Continued Medical Work up  Expected Discharge Plan and Services     Post Acute Care Choice: [place] Living arrangements for the past 2 months: Apartment                                       Social Determinants of Health (SDOH) Interventions SDOH Screenings   Food Insecurity: No Food Insecurity (07/29/2022)  Housing: Low Risk  (07/29/2022)  Transportation Needs: No Transportation Needs (07/29/2022)  Utilities: Not At Risk (07/29/2022)  Tobacco Use: Low Risk  (07/29/2022)  Recent Concern: Tobacco Use - Medium Risk (07/12/2022)    Readmission Risk Interventions     No data to display

## 2022-07-31 NOTE — TOC Transition Note (Signed)
Transition of Care [facility]) - CM/SW Discharge Note   Patient Details  Name: Charles Jennings MRN: [id number] Date of Birth: Aug 09, 1927  Transition of Care [facility]) CM/SW Contact:  Gerilyn Pilgrim, LCSW Phone Number: 07/31/2022, 3:01 PM   Clinical Narrative:   Pt discharging to [facility] home. Hospice to call transport.       Barriers to Discharge: Continued Medical Work up   Patient Goals and CMS Choice CMS Medicare.gov Compare Post Acute Care list provided to:: Patient Represenative (must comment) Choice offered to / list presented to : Adult Children  Discharge Placement                         Discharge Plan and Services Additional resources added to the After Visit Summary for       Post Acute Care Choice: Skilled Nursing Facility                               Social Determinants of Health (SDOH) Interventions SDOH Screenings   Food Insecurity: No Food Insecurity (07/29/2022)  Housing: Low Risk  (07/29/2022)  Transportation Needs: No Transportation Needs (07/29/2022)  Utilities: Not At Risk (07/29/2022)  Tobacco Use: Low Risk  (07/29/2022)  Recent Concern: Tobacco Use - Medium Risk (07/12/2022)     Readmission Risk Interventions     No data to display

## 2022-07-31 NOTE — Plan of Care (Signed)
  Problem: Activity: Goal: Ability to tolerate increased activity will improve Outcome: Progressing   Problem: Clinical Measurements: Goal: Ability to maintain a body temperature in the normal range will improve Outcome: Progressing   Problem: Respiratory: Goal: Ability to maintain adequate ventilation will improve Outcome: Progressing Goal: Ability to maintain a clear airway will improve Outcome: Progressing   Problem: Education: Goal: Ability to describe self-care measures that may prevent or decrease complications (Diabetes Survival Skills Education) will improve Outcome: Progressing Goal: Individualized Educational Video(s) Outcome: Progressing   Problem: Coping: Goal: Ability to adjust to condition or change in health will improve Outcome: Progressing   Problem: Fluid Volume: Goal: Ability to maintain a balanced intake and output will improve Outcome: Progressing   Problem: Health Behavior/Discharge Planning: Goal: Ability to identify and utilize available resources and services will improve Outcome: Progressing Goal: Ability to manage health-related needs will improve Outcome: Progressing   Problem: Metabolic: Goal: Ability to maintain appropriate glucose levels will improve Outcome: Progressing   Problem: Nutritional: Goal: Maintenance of adequate nutrition will improve Outcome: Progressing Goal: Progress toward achieving an optimal weight will improve Outcome: Progressing   Problem: Skin Integrity: Goal: Risk for impaired skin integrity will decrease Outcome: Progressing   Problem: Tissue Perfusion: Goal: Adequacy of tissue perfusion will improve Outcome: Progressing   Problem: Education: Goal: Knowledge of General Education information will improve Description: Including pain rating scale, medication(s)/side effects and non-pharmacologic comfort measures Outcome: Progressing   Problem: Health Behavior/Discharge Planning: Goal: Ability to manage  health-related needs will improve Outcome: Progressing   Problem: Clinical Measurements: Goal: Ability to maintain clinical measurements within normal limits will improve Outcome: Progressing Goal: Will remain free from infection Outcome: Progressing Goal: Diagnostic test results will improve Outcome: Progressing Goal: Respiratory complications will improve Outcome: Progressing Goal: Cardiovascular complication will be avoided Outcome: Progressing   Problem: Activity: Goal: Risk for activity intolerance will decrease Outcome: Progressing   Problem: Nutrition: Goal: Adequate nutrition will be maintained Outcome: Progressing   Problem: Coping: Goal: Level of anxiety will decrease Outcome: Progressing   Problem: Elimination: Goal: Will not experience complications related to bowel motility Outcome: Progressing Goal: Will not experience complications related to urinary retention Outcome: Progressing   Problem: Pain Managment: Goal: General experience of comfort will improve Outcome: Progressing   Problem: Safety: Goal: Ability to remain free from injury will improve Outcome: Progressing   Problem: Skin Integrity: Goal: Risk for impaired skin integrity will decrease Outcome: Progressing   Problem: Education: Goal: Knowledge of the prescribed therapeutic regimen will improve Outcome: Progressing   Problem: Coping: Goal: Ability to identify and develop effective coping behavior will improve Outcome: Progressing   Problem: Clinical Measurements: Goal: Quality of life will improve Outcome: Progressing   Problem: Respiratory: Goal: Verbalizations of increased ease of respirations will increase Outcome: Progressing   Problem: Role Relationship: Goal: Family's ability to cope with current situation will improve Outcome: Progressing Goal: Ability to verbalize concerns, feelings, and thoughts to partner or family member will improve Outcome: Progressing   Problem:  Pain Management: Goal: Satisfaction with pain management regimen will improve Outcome: Progressing

## 2022-07-31 NOTE — Consult Note (Signed)
Consultation Note Date: 07/31/2022   Patient Name: Charles Jennings  DOB: 1928/01/14  MRN: [id number]  Age / Sex: 89 y.o., male  PCP: Albina Billet, MD Referring Physician: Val Riles, MD  Reason for Consultation: Establishing goals of care   HPI/Brief Hospital Course: 89 y.o. male  with past medical history of HTN, HLD, PVD, T2DM, HFpEF admitted from SNF on 07/28/2022 with unresponsiveness and altered mental status. Recently admitted for sepsis, bacteremia, CAP and AKI-discharged to SNF, readmitted the following day.  CXR 2/23: New bilateral lower lung airspace disease and probable small bilateral pleural effusions  CT head 2/26: Stable small low-density right side SDH 5 mm in thickness or less. No significant intracranial mass effect. Stable noncontrast CT appearance of advanced small vessel disease.  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  Subjective:  Extensive chart review has been completed prior to meeting patient including labs, vital signs, imaging, progress notes, orders, and available advanced directive documents from current and previous encounters.  Introduced myself as a Designer, jewellery as a member of the palliative care team. Explained palliative medicine is specialized medical care for people living with serious illness. It focuses on providing relief from the symptoms and stress of a serious illness. The goal is to improve quality of life for both the patient and the family.   Visited with Mr. Toste at his bedside. No response during exam-eyes remain closed. Resistance met with passive range of motion to upper extremities.  Facial grimacing noted.  Later, met with daughter-Sabrina at bedside. Gabriel Cirri explains she has noticed a decline in her dad overall in the last several months. Noted multiple hospital admissions. He ultimate goals at last discharge was for him to undergo acute rehab to become strong enough to make the trip to [place] to live with her and her family. Unfortunately, Mr. Loatman continues to decline and Gabriel Cirri is now interested in Hospice services.  Gabriel Cirri reports Mr. Lell with agitation yesterday, 2/25, feels this is possibly terminal agitation as she is an ICU nurse. Over the several weeks she reports his appetite has been fluctuating. Noted albumin 2.0 on 07/28/22. Concerned he may be experiencing pain or discomfort as he does continue to grimace occasionally. Remains unresponsive to verbal stimulation.  Gabriel Cirri expresses wishing to transition to full comfort measures at this time. Gabriel Cirri voices understanding that Mr. Ooley would no longer receive aggressive medical interventions such as continuous vital signs, lab work, radiology testing, or medications not focused on comfort. All care would focus on how the patient is looking and feeling. This would include management of any symptoms that may cause discomfort, pain, shortness of breath, cough, nausea, agitation, anxiety, and/or secretions etc. Symptoms would be managed with medications and other non-pharmacological interventions such as spiritual support if requested, repositioning, music therapy, or therapeutic listening. Sabrina verbalized understanding and appreciation. She wishes to discontinue all medications including antibiotic therapy at this time-only providing medications that promote comfort.  Gabriel Cirri interested in Hospice services-requests meeting with Hospice liaison. Mentions her mother passed away at the [facility] in [place] and reflects on the positive experience. TOC consulted for coordination of Hospice liaison.   All  questions/concerns addressed. Emotional support provided to patient/family/support persons. PMT will continue to follow  and support patient as needed.  Objective: Primary Diagnoses: Present on Admission:  Aspiration pneumonia ([place])  Acute hypoxic respiratory failure ([place])   Physical Exam Constitutional:      General: He is not in acute distress.    Appearance: He is ill-appearing.     Comments: Unresponsive to verbal stimuli. Facial grimacing.  Pulmonary:     Effort: Pulmonary effort is normal. No respiratory distress.  Abdominal:     General: Abdomen is flat.     Palpations: Abdomen is soft.  Skin:    General: Skin is warm and dry.     Vital Signs: BP (!) 144/51 (BP Location: Left Arm)   Pulse 72   Temp 98.6 F (37 C)   Resp 18   Ht '5\' 8"'$  (1.727 m)   Wt 66 kg   SpO2 95%   BMI 22.12 kg/m  Pain Scale: Faces   Pain Score: 0-No pain  LBM: Last BM Date : 07/30/22 Baseline Weight: Weight: 66 kg Most recent weight: Weight: 66 kg       Palliative Assessment/Data: 35%   Assessment and Plan  SUMMARY OF RECOMMENDATIONS   DNR Comfort Measures-orders placed Morphine PRN, Robinul PRN, Haldol PRN, Ativan PRN to maintain comfort-unable to tolerate PO medications due to AMS TOC to coordinate meeting with Hospice liaison PMT to continue to follow for ongoing support  Discussed With: Primary team, nursing and [facility]  Thank you for this consult and allowing Palliative Medicine to participate in the care of Charles Jennings. Palliative medicine will continue to follow and assist as needed.   Time Total: 75 minutes  Time spent includes: Detailed review of medical records (labs, imaging, vital signs), medically appropriate exam (mental status, respiratory, cardiac, skin), discussions with treatment team, counseling and educating patient, family and staff, documenting clinical information, medication management (ordering of comfort medications) and coordination of  care.   Signed by: Theodoro Grist, DNP, AGNP-C Palliative Medicine    Please contact Palliative Medicine Team phone at [phone] for questions and concerns.  For individual provider: See Shea Evans

## 2022-08-02 LAB — CULTURE, BLOOD (ROUTINE X 2)
Culture: NO GROWTH
Culture: NO GROWTH
Special Requests: ADEQUATE
Special Requests: ADEQUATE

## 2022-08-03 ENCOUNTER — Telehealth: Payer: Medicare Other | Admitting: Infectious Diseases

## 2022-09-04 DEATH — deceased
